# Patient Record
Sex: Male | Born: 1945 | Race: White | Hispanic: No | Marital: Married | Smoking: Former smoker
Health system: Southern US, Community
[De-identification: ages and names within clinical notes are randomized; demographics above are authoritative.]

## PROBLEM LIST (undated history)

## (undated) DIAGNOSIS — K219 Gastro-esophageal reflux disease without esophagitis: Secondary | ICD-10-CM

## (undated) DIAGNOSIS — M1711 Unilateral primary osteoarthritis, right knee: Secondary | ICD-10-CM

## (undated) DIAGNOSIS — T148XXA Other injury of unspecified body region, initial encounter: Secondary | ICD-10-CM

## (undated) DIAGNOSIS — M1712 Unilateral primary osteoarthritis, left knee: Secondary | ICD-10-CM

## (undated) DIAGNOSIS — C801 Malignant (primary) neoplasm, unspecified: Secondary | ICD-10-CM

## (undated) HISTORY — PX: FOOT SURGERY: SHX648

## (undated) HISTORY — PX: BASAL CELL CARCINOMA EXCISION: SHX1214

## (undated) HISTORY — PX: HEMORRHOID BANDING: SHX5850

---

## 1971-08-04 HISTORY — PX: KNEE SURGERY: SHX244

## 2001-08-03 HISTORY — PX: FOOT SURGERY: SHX648

## 2004-06-11 ENCOUNTER — Ambulatory Visit (HOSPITAL_COMMUNITY): Admission: RE | Admit: 2004-06-11 | Discharge: 2004-06-11 | Payer: Self-pay | Admitting: Family Medicine

## 2004-07-04 ENCOUNTER — Ambulatory Visit: Payer: Self-pay | Admitting: Internal Medicine

## 2004-07-04 ENCOUNTER — Ambulatory Visit (HOSPITAL_COMMUNITY): Admission: RE | Admit: 2004-07-04 | Discharge: 2004-07-04 | Payer: Self-pay | Admitting: Internal Medicine

## 2004-07-04 HISTORY — PX: COLONOSCOPY: SHX174

## 2006-02-17 ENCOUNTER — Ambulatory Visit (HOSPITAL_COMMUNITY): Admission: RE | Admit: 2006-02-17 | Discharge: 2006-02-17 | Payer: Self-pay | Admitting: Family Medicine

## 2006-06-11 ENCOUNTER — Ambulatory Visit (HOSPITAL_COMMUNITY): Admission: RE | Admit: 2006-06-11 | Discharge: 2006-06-11 | Payer: Self-pay | Admitting: Family Medicine

## 2008-04-25 ENCOUNTER — Ambulatory Visit (HOSPITAL_COMMUNITY): Admission: RE | Admit: 2008-04-25 | Discharge: 2008-04-25 | Payer: Self-pay | Admitting: Family Medicine

## 2009-06-20 ENCOUNTER — Ambulatory Visit (HOSPITAL_COMMUNITY): Admission: RE | Admit: 2009-06-20 | Discharge: 2009-06-20 | Payer: Self-pay | Admitting: Family Medicine

## 2010-12-19 NOTE — Op Note (Signed)
NAME:  Spencer [name], Spencer [name]              ACCOUNT NO.:  [id number]   MEDICAL RECORD NO.:  [id number]          PATIENT TYPE:  AMB   LOCATION:  DAY                           FACILITY:  [facility]   PHYSICIAN:  R. Roetta Sessions, M.D. DATE OF BIRTH:  04/18/1946   DATE OF PROCEDURE:  07/04/2004  DATE OF DISCHARGE:                                 OPERATIVE REPORT   PROCEDURE:  Colonoscopy with cold biopsy.   INDICATION FOR PROCEDURE:  The patient is a 65 year old gentleman with no  lower GI tract symptoms referred out of the courtesy of Patrica Duel, M.D.,  for colorectal cancer screening.  He has never had a colonoscopy, and there  is no family history of colorectal neoplasia.  Colonoscopy is now being done  as a screening maneuver.  This approach has been discussed with the patient  at length.  The potential risks, benefits, and alternatives have been  reviewed, questions answered, and he is agreeable.  Please see my  handwritten H&P.   PROCEDURE NOTE:  O2 saturation, blood pressure, pulse, and respiration were  monitored throughout the entire procedure.   CONSCIOUS SEDATION:  Versed 3 mg IV, Demerol 75 mg IV in divided doses.   INSTRUMENT USED:  Olympus video chip system.   FINDINGS:  Digital rectal examination revealed no abnormalities.   Endoscopic findings:  The prep was good.   Rectum:  Examination of the rectal mucosa including a retroflexed view of  the anal verge revealed two diminutive polyps, two 2 mm polyps in at 4 cm  from the anal verge.  The remainder of the rectal mucosa appeared normal.   Colon:  The colonic mucosa was surveyed from the rectosigmoid junction  through the left, transverse, and right colon to the area of the appendiceal  orifice, ileocecal valve, and cecum.  These structures were well-seen and  photographed for the record.  From this level the scope was slowly  withdrawn.  All previously-mentioned mucosal surfaces were again seen.  The  patient was noted to  have tiny scattered left-sided diverticula.  The  remainder of the colonic mucosa appeared normal.  The patient tolerated the  procedure well, was reacted in endoscopy.   IMPRESSION:  1.  Diminutive rectal polyps, removed with cold biopsy forceps, otherwise      normal rectum.  2.  Minimal left-sided diverticula, the remainder of the colonic mucosa      appeared normal.   RECOMMENDATIONS:  1.  Diverticulosis literature provided to Mr. Yepes.  2.  Follow up on pathology.  3.  Further recommendations to follow.     Otelia Sergeant   RMR/MEDQ  D:  07/04/2004  T:  07/04/2004  Job:  [id number]   cc:   Patrica Duel, M.D.  [address], [address]  [place]  [place] [address]  Fax: [phone]

## 2013-02-20 ENCOUNTER — Telehealth: Payer: Self-pay

## 2013-02-20 NOTE — Telephone Encounter (Signed)
Pt called and said he received a letter to schedule colonoscopy. He was referred by Dr. Sherwood Gambler for screening and his last was 07/2004 by RMR. He said he has had some blood in stool and they gave him some medicine and is better at this time. OV with Gerrit Halls, NP on 03/09/2013 at 8:00 AM.

## 2013-02-28 ENCOUNTER — Encounter: Payer: Self-pay | Admitting: Internal Medicine

## 2013-03-09 ENCOUNTER — Other Ambulatory Visit: Payer: Self-pay | Admitting: Internal Medicine

## 2013-03-09 ENCOUNTER — Encounter: Payer: Self-pay | Admitting: Gastroenterology

## 2013-03-09 ENCOUNTER — Encounter (HOSPITAL_COMMUNITY): Payer: Self-pay | Admitting: Pharmacy Technician

## 2013-03-09 ENCOUNTER — Ambulatory Visit (INDEPENDENT_AMBULATORY_CARE_PROVIDER_SITE_OTHER): Payer: Medicare Other | Admitting: Gastroenterology

## 2013-03-09 VITALS — BP 140/100 | HR 95 | Temp 97.7°F | Ht 72.0 in | Wt 180.6 lb

## 2013-03-09 DIAGNOSIS — K625 Hemorrhage of anus and rectum: Secondary | ICD-10-CM

## 2013-03-09 MED ORDER — PEG 3350-KCL-NA BICARB-NACL 420 G PO SOLR: 4000.0000 mL | ORAL | Status: DC

## 2013-03-09 NOTE — Progress Notes (Signed)
Primary Care Physician:  FUSCO,LAWRENCE J., MD Primary Gastroenterologist:  Dr. Rourk    Chief Complaint  Patient presents with  . Colonoscopy  . Rectal Bleeding    HPI:   Spencer [name] is a 66-year-old male who presents today secondary to evaluation for updated colonoscopy. Last lower GI evaluation in 2005 by Dr. Rourk with hyperplastic polyp and diverticula.   Noticed rectal bleeding recently. Usually low-volume hematochezia first thing in the morning with first bowel movement. Has had 2 rounds of anusol, with improvement after 2nd round. Now returned. Painless. No itching. No bowel changes. No abdominal pain. No loss of appetite or weight changes. Rare reflux.   Past Medical History  Diagnosis Date  . Medical history non-contributory     Past Surgical History  Procedure Laterality Date  . Colonoscopy  07/04/2004    RMR: Diminutive rectal polyps, removed with cold biopsy forceps, otherwise normal/  Minimal left-sided diverticula, the remainder of the colonic mucosa normal. Hyerplastic  . Foot surgery      X 2  . Knee surgery      Current Outpatient Prescriptions  Medication Sig Dispense Refill  . aspirin 81 MG tablet Take 81 mg by mouth daily.      . Cyanocobalamin (VITAMIN B 12 PO) Take by mouth.      . DHEA 10 MG CAPS Take by mouth.      . fish oil-omega-3 fatty acids 1000 MG capsule Take 1 g by mouth daily.      . Multiple Vitamins-Minerals (MULTIVITAMIN WITH MINERALS) tablet Take 1 tablet by mouth daily.       No current facility-administered medications for this visit.    Allergies as of 03/09/2013  . (No Known Allergies)    Family History  Problem Relation Age of Onset  . Colon cancer Neg Hx     History   Social History  . Marital Status: Married    Spouse Name: N/A    Number of Children: N/A  . Years of Education: N/A   Occupational History  . retired     Dean of Students at [employer]   Social History Main Topics  . Smoking status: Never Smoker   .  Smokeless tobacco: Not on file  . Alcohol Use: Yes     Comment: beer daily   . Drug Use: No  . Sexually Active: Not on file   Other Topics Concern  . Not on file   Social History Narrative  . No narrative on file    Review of Systems: Negative unless mentioned above  Physical Exam: BP 159/100  Pulse 95  Temp(Src) 97.7 F (36.5 C) (Oral)  Ht 6' (1.829 m)  Wt 180 lb 9.6 oz (81.92 kg)  BMI 24.49 kg/m2 General:   Alert and oriented. Pleasant and cooperative. Well-nourished and well-developed.  Head:  Normocephalic and atraumatic. Eyes:  Without icterus, sclera clear and conjunctiva pink.  Ears:  Normal auditory acuity. Nose:  No deformity, discharge,  or lesions. Mouth:  No deformity or lesions, oral mucosa pink.  Neck:  Supple, without mass or thyromegaly. Lungs:  Clear to auscultation bilaterally. No wheezes, rales, or rhonchi. No distress.  Heart:  S1, S2 present without murmurs appreciated.  Abdomen:  +BS, soft, non-tender and non-distended. No HSM noted. No guarding or rebound. No masses appreciated.  Rectal:  Deferred  Msk:  Symmetrical without gross deformities. Normal posture. Extremities:  Without clubbing or edema. Neurologic:  Alert and  oriented x4;  grossly normal neurologically. Skin:    Intact without significant lesions or rashes. Cervical Nodes:  No significant cervical adenopathy. Psych:  Alert and cooperative. Normal mood and affect.    

## 2013-03-09 NOTE — Patient Instructions (Addendum)
We have scheduled you for a colonoscopy with Dr. Rourk in the near future.  Further recommendations to follow shortly!   

## 2013-03-09 NOTE — Assessment & Plan Note (Addendum)
67 year old male with new-onset painless rectal bleeding without changes in bowel habits, abdominal pain, or other concerning symptoms. Last lower GI evaluation in 2005 by Dr. Jena Gauss with hyperplastic polyp and diverticula. Likely dealing with benign anorectal source, but I recommend a repeat lower GI evaluation due to the length of time that has passed since original screening. Spencer [name] agrees with this plan, and we will proceed with a colonoscopy in the near future.  Proceed with TCS with Dr. Jena Gauss in near future: the risks, benefits, and alternatives have been discussed with the patient in detail. The patient states understanding and desires to proceed. Phenergan 12.5 mg IV on call to help augment sedation due to daily low-volume ETOH use.   As of note, hypertensive on exam today but without symptoms. Patient notes he has "white coat syndrome". At home, his BP ranges in the 120-130s/80s.

## 2013-03-13 NOTE — Progress Notes (Signed)
CC'd PCP

## 2013-03-23 ENCOUNTER — Other Ambulatory Visit: Payer: Self-pay | Admitting: Internal Medicine

## 2013-03-23 MED ORDER — PEG 3350-KCL-NA BICARB-NACL 420 G PO SOLR: 4000.0000 mL | ORAL | Status: DC

## 2013-03-27 ENCOUNTER — Encounter (HOSPITAL_COMMUNITY): Payer: Self-pay | Admitting: *Deleted

## 2013-03-27 ENCOUNTER — Encounter (HOSPITAL_COMMUNITY)
Admission: RE | Disposition: A | Discharge status: Home / Self Care | Payer: Self-pay | Source: Ambulatory Visit | Attending: Internal Medicine

## 2013-03-27 ENCOUNTER — Ambulatory Visit (HOSPITAL_COMMUNITY)
Admission: RE | Admit: 2013-03-27 | Discharge: 2013-03-27 | Disposition: A | Discharge status: Home / Self Care | Payer: Medicare PPO | Source: Ambulatory Visit | Attending: Internal Medicine | Admitting: Internal Medicine

## 2013-03-27 DIAGNOSIS — K648 Other hemorrhoids: Secondary | ICD-10-CM

## 2013-03-27 DIAGNOSIS — K921 Melena: Secondary | ICD-10-CM | POA: Insufficient documentation

## 2013-03-27 DIAGNOSIS — Z7982 Long term (current) use of aspirin: Secondary | ICD-10-CM | POA: Insufficient documentation

## 2013-03-27 DIAGNOSIS — K5730 Diverticulosis of large intestine without perforation or abscess without bleeding: Secondary | ICD-10-CM

## 2013-03-27 DIAGNOSIS — D126 Benign neoplasm of colon, unspecified: Secondary | ICD-10-CM

## 2013-03-27 DIAGNOSIS — R233 Spontaneous ecchymoses: Secondary | ICD-10-CM | POA: Insufficient documentation

## 2013-03-27 DIAGNOSIS — K625 Hemorrhage of anus and rectum: Secondary | ICD-10-CM

## 2013-03-27 HISTORY — PX: COLONOSCOPY: SHX5424

## 2013-03-27 SURGERY — COLONOSCOPY
Anesthesia: Moderate Sedation

## 2013-03-27 MED ORDER — PROMETHAZINE HCL 25 MG/ML IJ SOLN: 12.5000 mg | Freq: Once | INTRAMUSCULAR | Status: DC

## 2013-03-27 MED ORDER — MIDAZOLAM HCL 5 MG/5ML IJ SOLN
INTRAMUSCULAR | Status: DC | PRN
  Administered 2013-03-27: 1 mg via INTRAVENOUS
  Administered 2013-03-27 (×2): 2 mg via INTRAVENOUS

## 2013-03-27 MED ORDER — MEPERIDINE HCL 100 MG/ML IJ SOLN
INTRAMUSCULAR | Status: AC
  Filled 2013-03-27: qty 1

## 2013-03-27 MED ORDER — SODIUM CHLORIDE 0.9 % IV SOLN
INTRAVENOUS | Status: DC
  Administered 2013-03-27: 08:00:00 via INTRAVENOUS

## 2013-03-27 MED ORDER — MIDAZOLAM HCL 5 MG/5ML IJ SOLN
INTRAMUSCULAR | Status: AC
  Filled 2013-03-27: qty 10

## 2013-03-27 MED ORDER — PROMETHAZINE HCL 25 MG/ML IJ SOLN
INTRAMUSCULAR | Status: AC
  Filled 2013-03-27: qty 1

## 2013-03-27 MED ORDER — STERILE WATER FOR IRRIGATION IR SOLN
Status: DC | PRN
  Administered 2013-03-27: 09:00:00

## 2013-03-27 MED ORDER — MEPERIDINE HCL 100 MG/ML IJ SOLN
INTRAMUSCULAR | Status: DC | PRN
  Administered 2013-03-27: 25 mg via INTRAVENOUS
  Administered 2013-03-27: 50 mg via INTRAVENOUS

## 2013-03-27 MED ORDER — ONDANSETRON HCL 4 MG/2ML IJ SOLN
INTRAMUSCULAR | Status: DC | PRN
  Administered 2013-03-27: 4 mg via INTRAVENOUS

## 2013-03-27 MED ORDER — ONDANSETRON HCL 4 MG/2ML IJ SOLN
INTRAMUSCULAR | Status: AC
  Filled 2013-03-27: qty 2

## 2013-03-27 MED ORDER — SODIUM CHLORIDE 0.9 % IJ SOLN
INTRAMUSCULAR | Status: AC
  Filled 2013-03-27: qty 10

## 2013-03-27 NOTE — Op Note (Signed)
[facility] [address] [place], [address]   COLONOSCOPY PROCEDURE REPORT  PATIENT: Spencer [name], Spencer [name]  MR#:         [id number] BIRTHDATE: 10-26-1945 , 66  yrs. old GENDER: Male ENDOSCOPIST: R.  Roetta Sessions, MD FACP FACG REFERRED BY:  Artis Delay, M.D. PROCEDURE DATE:  03/27/2013 PROCEDURE:     Colonoscopy with biopsy  INDICATIONS: Hematochezia  INFORMED CONSENT:  The risks, benefits, alternatives and imponderables including but not limited to bleeding, perforation as well as the possibility of a missed lesion have been reviewed.  The potential for biopsy, lesion removal, etc. have also been discussed.  Questions have been answered.  All parties agreeable. Please see the history and physical in the medical record for more information.  MEDICATIONS: Versed 5 mg IV and Demerol 75 mg IV in divided doses. Zofran 4 mg IV.  DESCRIPTION OF PROCEDURE:  After a digital rectal exam was performed, the EC-[id number] ([id number])  colonoscope was advanced from the anus through the rectum and colon to the area of the cecum, ileocecal valve and appendiceal orifice.  The cecum was deeply intubated.  These structures were well-seen and photographed for the record.  From the level of the cecum and ileocecal valve, the scope was slowly and cautiously withdrawn.  The mucosal surfaces were carefully surveyed utilizing scope tip deflection to facilitate fold flattening as needed.  The scope was pulled down into the rectum where a thorough examination including retroflexion was performed.    FINDINGS:  adequate preparation.  Prominent internal hemorrhoids; otherwise normal rectum. Scattered sigmoid diverticula with associated areas of submucosal petechiae (see above photos). The patient also had (2) 2 mm diminutive polypoid lesions opposite the ileocecal valve appeared The remainder of the colonic mucosa appeared normal.  THERAPEUTIC / DIAGNOSTIC MANEUVERS PERFORMED:  The  abnormal sigmoid mucosa was biopsied for histologic study. The diminutive polypoid lesions opposite the ileocecal valve were cold biopsied/removed COMPLICATIONS:  CECAL WITHDRAWAL TIME:  12 minutes  IMPRESSION:  Prominent internal hemorrhoids-likely source of hematochezia. Sigmoid diverticulosis. Submucosal colonic petechiae the sigmoid segment of doubtful clinical significance-status post biopsy. 2 diminutive polypoid lesions opposite the ileocecal valve-cold biopsied/removed  RECOMMENDATIONS: Followup on pathology. Further recommendations to follow ( patient may well benefit from hemorrhoidal banding in the near future)   _______________________________ eSigned:  R. Roetta Sessions, MD FACP [facility] 03/27/2013 9:41 AM   CC:

## 2013-03-27 NOTE — Interval H&P Note (Signed)
History and Physical Interval Note:  03/27/2013 8:56 AM  Spencer [name]  has presented today for surgery, with the diagnosis of RECTAL BLEEDING  The various methods of treatment have been discussed with the patient and family. After consideration of risks, benefits and other options for treatment, the patient has consented to  Procedure(s) with comments: COLONOSCOPY (N/A) - 8:45 as a surgical intervention .  The patient's history has been reviewed, patient examined, no change in status, stable for surgery.  I have reviewed the patient's chart and labs.  Questions were answered to the patient's satisfaction.    No change. Colonoscopy per plan.The risks, benefits, limitations, alternatives and imponderables have been reviewed with the patient. Questions have been answered. All parties are agreeable.    Eula Listen

## 2013-03-27 NOTE — H&P (View-Only) (Signed)
Primary Care Physician:  Cassell Smiles., MD Primary Gastroenterologist:  Dr. Jena Gauss    Chief Complaint  Patient presents with  . Colonoscopy  . Rectal Bleeding    HPI:   Spencer [name] is a 67 year old male who presents today secondary to evaluation for updated colonoscopy. Last lower GI evaluation in 2005 by Dr. Jena Gauss with hyperplastic polyp and diverticula.   Noticed rectal bleeding recently. Usually low-volume hematochezia first thing in the morning with first bowel movement. Has had 2 rounds of anusol, with improvement after 2nd round. Now returned. Painless. No itching. No bowel changes. No abdominal pain. No loss of appetite or weight changes. Rare reflux.   Past Medical History  Diagnosis Date  . Medical history non-contributory     Past Surgical History  Procedure Laterality Date  . Colonoscopy  07/04/2004    RMR: Diminutive rectal polyps, removed with cold biopsy forceps, otherwise normal/  Minimal left-sided diverticula, the remainder of the colonic mucosa normal. Hyerplastic  . Foot surgery      X 2  . Knee surgery      Current Outpatient Prescriptions  Medication Sig Dispense Refill  . aspirin 81 MG tablet Take 81 mg by mouth daily.      . Cyanocobalamin (VITAMIN B 12 PO) Take by mouth.      Marland Kitchen DHEA 10 MG CAPS Take by mouth.      . fish oil-omega-3 fatty acids 1000 MG capsule Take 1 g by mouth daily.      . Multiple Vitamins-Minerals (MULTIVITAMIN WITH MINERALS) tablet Take 1 tablet by mouth daily.       No current facility-administered medications for this visit.    Allergies as of 03/09/2013  . (No Known Allergies)    Family History  Problem Relation Age of Onset  . Colon cancer Neg Hx     History   Social History  . Marital Status: Married    Spouse Name: N/A    Number of Children: N/A  . Years of Education: N/A   Occupational History  . retired     Public house manager of Students at [employer]   Social History Main Topics  . Smoking status: Never Smoker   .  Smokeless tobacco: Not on file  . Alcohol Use: Yes     Comment: beer daily   . Drug Use: No  . Sexually Active: Not on file   Other Topics Concern  . Not on file   Social History Narrative  . No narrative on file    Review of Systems: Negative unless mentioned above  Physical Exam: BP 159/100  Pulse 95  Temp(Src) 97.7 F (36.5 C) (Oral)  Ht 6' (1.829 m)  Wt 180 lb 9.6 oz (81.92 kg)  BMI 24.49 kg/m2 General:   Alert and oriented. Pleasant and cooperative. Well-nourished and well-developed.  Head:  Normocephalic and atraumatic. Eyes:  Without icterus, sclera clear and conjunctiva pink.  Ears:  Normal auditory acuity. Nose:  No deformity, discharge,  or lesions. Mouth:  No deformity or lesions, oral mucosa pink.  Neck:  Supple, without mass or thyromegaly. Lungs:  Clear to auscultation bilaterally. No wheezes, rales, or rhonchi. No distress.  Heart:  S1, S2 present without murmurs appreciated.  Abdomen:  +BS, soft, non-tender and non-distended. No HSM noted. No guarding or rebound. No masses appreciated.  Rectal:  Deferred  Msk:  Symmetrical without gross deformities. Normal posture. Extremities:  Without clubbing or edema. Neurologic:  Alert and  oriented x4;  grossly normal neurologically. Skin:  Intact without significant lesions or rashes. Cervical Nodes:  No significant cervical adenopathy. Psych:  Alert and cooperative. Normal mood and affect.

## 2013-03-29 ENCOUNTER — Encounter (HOSPITAL_COMMUNITY): Payer: Self-pay | Admitting: Internal Medicine

## 2013-03-29 ENCOUNTER — Encounter: Payer: Self-pay | Admitting: Internal Medicine

## 2013-03-29 NOTE — Progress Notes (Signed)
Pt is aware of banding on 9/10 at 245 with RMR and appt card was mailed

## 2013-03-29 NOTE — Progress Notes (Signed)
Cc PCP 

## 2013-03-29 NOTE — Progress Notes (Unsigned)
Letter from: Corbin Ade  Reason for Letter: Results Review   Would offer hemorrhoid banding in the office. Send letter to patient.  Send copy of letter with path to referring provider and PCP.

## 2013-03-29 NOTE — Progress Notes (Unsigned)
Letter mailed to pt. Darl Pikes, please nic and schedule hemorrhoid banding. Benedetto Goad, please cc pcp

## 2013-04-12 ENCOUNTER — Encounter: Payer: Self-pay | Admitting: Internal Medicine

## 2013-04-12 ENCOUNTER — Ambulatory Visit: Payer: Medicare PPO | Admitting: Internal Medicine

## 2013-04-12 VITALS — BP 136/81 | HR 87 | Temp 97.6°F | Ht 72.0 in | Wt 181.8 lb

## 2013-04-12 DIAGNOSIS — K921 Melena: Secondary | ICD-10-CM

## 2013-04-12 DIAGNOSIS — K649 Unspecified hemorrhoids: Secondary | ICD-10-CM

## 2013-04-12 DIAGNOSIS — K648 Other hemorrhoids: Secondary | ICD-10-CM

## 2013-04-12 NOTE — Patient Instructions (Addendum)
Begin Benefiber 2 teaspoons twice daily   Continue to avoid straining when you have a bowel movement  Office visit with [place] in 2-3 weeks to reassess and decide if further banding needed  Call with any interim problems or concerns

## 2013-04-12 NOTE — Progress Notes (Signed)
Hemorrhoid banding note  Patient was found to have friable internal hemorrhoids at colonoscopy recently-felt to be the of  hematochezia. Intermittent and recurrent over the years. Discussed hemorrhoid banding with him at the time of colonoscopy in today. Discussed risks benefits limitations and alternatives. He is agreeable.  Anoscopy performed which revealed a prominent friable right anterior hemorrhoid.  Utilizing the [facility] banding apparatus, a single latex band was applied to the right anterior hemorrhoid. There was no pain associated with this maneuver. A digital rectal exam revealed the band to be appropriately snug. Patient tolerated the procedure very well.  See discharge instructions.

## 2013-05-09 ENCOUNTER — Encounter: Payer: Self-pay | Admitting: Internal Medicine

## 2013-05-09 ENCOUNTER — Ambulatory Visit: Payer: Medicare PPO | Admitting: Internal Medicine

## 2013-05-09 VITALS — BP 153/93 | HR 94 | Temp 97.6°F | Ht 69.0 in | Wt 180.0 lb

## 2013-05-09 DIAGNOSIS — K648 Other hemorrhoids: Secondary | ICD-10-CM

## 2013-05-09 NOTE — Progress Notes (Signed)
Patient is status post hemorrhoid banding of the right anterior pile a couple of weeks ago. He states this has been extremely effective in combating his rectal bleeding. Having no further bleeding has normal bowel function. Does not feel he needs further banding at this time. I agree. Based on anoscopy, the single friable hemorrhoid was banded at his initial session.  I recommended he continue using Benefiber 2 teaspoons twice daily. He will need a surveillance colonoscopy in 5 years given history of adenoma. If he has any interim bleeding he is to let us know .

## 2013-06-15 ENCOUNTER — Other Ambulatory Visit: Payer: Self-pay | Admitting: Dermatology

## 2014-02-22 ENCOUNTER — Other Ambulatory Visit (HOSPITAL_COMMUNITY): Payer: Self-pay | Admitting: Family Medicine

## 2014-02-22 DIAGNOSIS — Z0000 Encounter for general adult medical examination without abnormal findings: Secondary | ICD-10-CM

## 2014-02-22 DIAGNOSIS — Z87891 Personal history of nicotine dependence: Secondary | ICD-10-CM

## 2014-02-23 ENCOUNTER — Other Ambulatory Visit: Payer: Self-pay | Admitting: Radiology

## 2014-02-28 ENCOUNTER — Ambulatory Visit (HOSPITAL_COMMUNITY): Payer: Medicare PPO

## 2014-03-01 ENCOUNTER — Encounter (HOSPITAL_COMMUNITY): Payer: Self-pay | Admitting: Pharmacy Technician

## 2014-03-05 NOTE — Patient Instructions (Signed)
Spencer [name]  03/05/2014   Your procedure is scheduled on:  03/13/14  Report to [facility] at 06:15 AM.  Call this number if you have problems the morning of surgery: [phone]   Remember:   Do not eat food or drink liquids after midnight.   Take these medicines the morning of surgery with A SIP OF WATER: None   Do not wear jewelry, make-up or nail polish.  Do not wear lotions, powders, or perfumes. You may wear deodorant.  Do not shave 48 hours prior to surgery. Men may shave face and neck.  Do not bring valuables to the hospital.  [facility] is not responsible for any belongings or valuables.               Contacts, dentures or bridgework may not be worn into surgery.  Leave suitcase in the car. After surgery it may be brought to your room.  For patients admitted to the hospital, discharge time is determined by your treatment team.               Patients discharged the day of surgery will not be allowed to drive home.   Special Instructions: Shower using Hibiclens the night before surgery and the morning of surgery.   Please read over the following fact sheets that you were given: Anesthesia Post-op Instructions    Olecranon Bursitis Bursitis is swelling and soreness (inflammation) of a fluid-filled sac (bursa) that covers and protects a joint. Olecranon bursitis occurs over the elbow.  CAUSES Bursitis can be caused by injury, overuse of the joint, arthritis, or infection.  SYMPTOMS   Tenderness, swelling, warmth, or redness over the elbow.  Elbow pain with movement. This is greater with bending the elbow.  Squeaking sound when the bursa is rubbed or moved.  Increasing size of the bursa without pain or discomfort.  Fever with increasing pain and swelling if the bursa becomes infected. HOME CARE INSTRUCTIONS   Put ice on the affected area.  Put ice in a plastic bag.  Place a towel between your skin and the bag.  Leave the ice on for 15-20 minutes each hour  while awake. Do this for the first 2 days.  When resting, elevate your elbow above the level of your heart. This helps reduce swelling.  Continue to put the joint through a full range of motion 4 times per day. Rest the injured joint at other times. When the pain lessens, begin normal slow movements and usual activities.  Only take over-the-counter or prescription medicines for pain, discomfort, or fever as directed by your caregiver.  Reduce your intake of milk and related dairy products (cheese, yogurt). They may make your condition worse. SEEK IMMEDIATE MEDICAL CARE IF:   Your pain increases even during treatment.  You have a fever.  You have heat and inflammation over the bursa and elbow.  You have a red line that goes up your arm.  You have pain with movement of your elbow. MAKE SURE YOU:   Understand these instructions.  Will watch your condition.  Will get help right away if you are not doing well or get worse. Document Released: 08/19/2006 Document Revised: 10/12/2011 Document Reviewed: 07/05/2007 [facility] Patient Information 2015 [place], [place]. This information is not intended to replace advice given to you by your health care provider. Make sure you discuss any questions you have with your health care provider.    PATIENT INSTRUCTIONS POST-ANESTHESIA  IMMEDIATELY FOLLOWING SURGERY:  Do not drive  or operate machinery for the first twenty four hours after surgery.  Do not make any important decisions for twenty four hours after surgery or while taking narcotic pain medications or sedatives.  If you develop intractable nausea and vomiting or a severe headache please notify your doctor immediately.  FOLLOW-UP:  Please make an appointment with your surgeon as instructed. You do not need to follow up with anesthesia unless specifically instructed to do so.  WOUND CARE INSTRUCTIONS (if applicable):  Keep a dry clean dressing on the anesthesia/puncture wound site if there  is drainage.  Once the wound has quit draining you may leave it open to air.  Generally you should leave the bandage intact for twenty four hours unless there is drainage.  If the epidural site drains for more than 36-48 hours please call the anesthesia department.  QUESTIONS?:  Please feel free to call your physician or the hospital operator if you have any questions, and they will be happy to assist you.

## 2014-03-06 ENCOUNTER — Encounter (HOSPITAL_COMMUNITY)
Admission: RE | Admit: 2014-03-06 | Discharge: 2014-03-06 | Disposition: A | Discharge status: Home / Self Care | Payer: Medicare PPO | Source: Ambulatory Visit | Attending: Orthopaedic Surgery | Admitting: Orthopaedic Surgery

## 2014-03-12 ENCOUNTER — Ambulatory Visit (HOSPITAL_COMMUNITY)
Admission: RE | Admit: 2014-03-12 | Discharge: 2014-03-12 | Disposition: A | Discharge status: Home / Self Care | Payer: Medicare PPO | Source: Ambulatory Visit | Attending: Family Medicine | Admitting: Family Medicine

## 2014-03-12 DIAGNOSIS — Z87891 Personal history of nicotine dependence: Secondary | ICD-10-CM | POA: Diagnosis not present

## 2014-03-12 DIAGNOSIS — Z0000 Encounter for general adult medical examination without abnormal findings: Secondary | ICD-10-CM | POA: Insufficient documentation

## 2014-03-13 ENCOUNTER — Encounter (HOSPITAL_COMMUNITY): Admission: RE | Payer: Self-pay | Source: Ambulatory Visit

## 2014-03-13 ENCOUNTER — Ambulatory Visit (HOSPITAL_COMMUNITY): Admission: RE | Admit: 2014-03-13 | Payer: Medicare PPO | Source: Ambulatory Visit | Admitting: Orthopaedic Surgery

## 2014-03-13 SURGERY — BURSECTOMY, ELBOW
Anesthesia: Choice | Site: Elbow | Laterality: Right

## 2014-03-22 ENCOUNTER — Other Ambulatory Visit: Payer: Self-pay | Admitting: Radiology

## 2014-03-22 ENCOUNTER — Encounter (HOSPITAL_COMMUNITY): Payer: Self-pay | Admitting: Pharmacy Technician

## 2014-03-23 NOTE — Patient Instructions (Signed)
Spencer [name]  03/23/2014   Your procedure is scheduled on:  03/29/14  Report to [facility] at 6:15 AM.  Call this number if you have problems the morning of surgery: [phone]   Remember:   Do not eat food or drink liquids after midnight.   Take these medicines the morning of surgery with A SIP OF WATER: None   Do not wear jewelry, make-up or nail polish.  Do not wear lotions, powders, or perfumes. You may wear deodorant.  Do not shave 48 hours prior to surgery. Men may shave face and neck.  Do not bring valuables to the hospital.  [facility] is not responsible for any belongings or valuables.               Contacts, dentures or bridgework may not be worn into surgery.  Leave suitcase in the car. After surgery it may be brought to your room.  For patients admitted to the hospital, discharge time is determined by your treatment team.               Patients discharged the day of surgery will not be allowed to drive home.  Name and phone number of your driver:   Special Instructions: Incentive Spirometry - Practice and bring it with you on the day of surgery. Shower using CHG 2 nights before surgery and the night before surgery.  If you shower the day of surgery use CHG.  Use special wash - you have one bottle of CHG for all showers.  You should use approximately 1/3 of the bottle for each shower.   Please read over the following fact sheets that you were given: Surgical Site Infection Prevention and Anesthesia Post-op Instructions  PATIENT INSTRUCTIONS POST-ANESTHESIA  IMMEDIATELY FOLLOWING SURGERY:  Do not drive or operate machinery for the first twenty four hours after surgery.  Do not make any important decisions for twenty four hours after surgery or while taking narcotic pain medications or sedatives.  If you develop intractable nausea and vomiting or a severe headache please notify your doctor immediately.  FOLLOW-UP:  Please make an appointment with your surgeon as  instructed. You do not need to follow up with anesthesia unless specifically instructed to do so.  WOUND CARE INSTRUCTIONS (if applicable):  Keep a dry clean dressing on the anesthesia/puncture wound site if there is drainage.  Once the wound has quit draining you may leave it open to air.  Generally you should leave the bandage intact for twenty four hours unless there is drainage.  If the epidural site drains for more than 36-48 hours please call the anesthesia department.  QUESTIONS?:  Please feel free to call your physician or the hospital operator if you have any questions, and they will be happy to assist you.      Olecranon Bursitis Bursitis is swelling and soreness (inflammation) of a fluid-filled sac (bursa) that covers and protects a joint. Olecranon bursitis occurs over the elbow.  CAUSES Bursitis can be caused by injury, overuse of the joint, arthritis, or infection.  SYMPTOMS   Tenderness, swelling, warmth, or redness over the elbow.  Elbow pain with movement. This is greater with bending the elbow.  Squeaking sound when the bursa is rubbed or moved.  Increasing size of the bursa without pain or discomfort.  Fever with increasing pain and swelling if the bursa becomes infected. HOME CARE INSTRUCTIONS   Put ice on the affected area.  Put ice in a plastic bag.  Place  a towel between your skin and the bag.  Leave the ice on for 15-20 minutes each hour while awake. Do this for the first 2 days.  When resting, elevate your elbow above the level of your heart. This helps reduce swelling.  Continue to put the joint through a full range of motion 4 times per day. Rest the injured joint at other times. When the pain lessens, begin normal slow movements and usual activities.  Only take over-the-counter or prescription medicines for pain, discomfort, or fever as directed by your caregiver.  Reduce your intake of milk and related dairy products (cheese, yogurt). They may make  your condition worse. SEEK IMMEDIATE MEDICAL CARE IF:   Your pain increases even during treatment.  You have a fever.  You have heat and inflammation over the bursa and elbow.  You have a red line that goes up your arm.  You have pain with movement of your elbow. MAKE SURE YOU:   Understand these instructions.  Will watch your condition.  Will get help right away if you are not doing well or get worse. Document Released: 08/19/2006 Document Revised: 10/12/2011 Document Reviewed: 07/05/2007 [facility] Patient Information 2015 [place], [place]. This information is not intended to replace advice given to you by your health care provider. Make sure you discuss any questions you have with your health care provider.

## 2014-03-26 ENCOUNTER — Encounter (HOSPITAL_COMMUNITY): Payer: Self-pay

## 2014-03-26 ENCOUNTER — Encounter (HOSPITAL_COMMUNITY)
Admission: RE | Admit: 2014-03-26 | Discharge: 2014-03-26 | Disposition: A | Discharge status: Home / Self Care | Payer: Medicare PPO | Source: Ambulatory Visit | Attending: Orthopaedic Surgery | Admitting: Orthopaedic Surgery

## 2014-03-26 DIAGNOSIS — Z87891 Personal history of nicotine dependence: Secondary | ICD-10-CM | POA: Diagnosis not present

## 2014-03-26 DIAGNOSIS — M7020 Olecranon bursitis, unspecified elbow: Secondary | ICD-10-CM | POA: Diagnosis not present

## 2014-03-26 DIAGNOSIS — Z85828 Personal history of other malignant neoplasm of skin: Secondary | ICD-10-CM | POA: Diagnosis not present

## 2014-03-26 HISTORY — DX: Malignant (primary) neoplasm, unspecified: C80.1

## 2014-03-26 LAB — CBC WITH DIFFERENTIAL/PLATELET
Basophils Absolute: 0 10*3/uL (ref 0.0–0.1)
Basophils Relative: 0 % (ref 0–1)
Eosinophils Absolute: 0 10*3/uL (ref 0.0–0.7)
Eosinophils Relative: 1 % (ref 0–5)
HCT: 42.6 % (ref 39.0–52.0)
Hemoglobin: 14.8 g/dL (ref 13.0–17.0)
LYMPHS PCT: 27 % (ref 12–46)
Lymphs Abs: 1.9 10*3/uL (ref 0.7–4.0)
MCH: 32.6 pg (ref 26.0–34.0)
MCHC: 34.7 g/dL (ref 30.0–36.0)
MCV: 93.8 fL (ref 78.0–100.0)
Monocytes Absolute: 0.5 10*3/uL (ref 0.1–1.0)
Monocytes Relative: 7 % (ref 3–12)
NEUTROS ABS: 4.6 10*3/uL (ref 1.7–7.7)
Neutrophils Relative %: 65 % (ref 43–77)
PLATELETS: 230 10*3/uL (ref 150–400)
RBC: 4.54 MIL/uL (ref 4.22–5.81)
RDW: 11.9 % (ref 11.5–15.5)
WBC: 7.1 10*3/uL (ref 4.0–10.5)

## 2014-03-26 LAB — COMPREHENSIVE METABOLIC PANEL
ALK PHOS: 53 U/L (ref 39–117)
ALT: 21 U/L (ref 0–53)
AST: 22 U/L (ref 0–37)
Albumin: 3.7 g/dL (ref 3.5–5.2)
Anion gap: 12 (ref 5–15)
BILIRUBIN TOTAL: 0.5 mg/dL (ref 0.3–1.2)
BUN: 13 mg/dL (ref 6–23)
CALCIUM: 9.1 mg/dL (ref 8.4–10.5)
CHLORIDE: 100 meq/L (ref 96–112)
CO2: 28 meq/L (ref 19–32)
Creatinine, Ser: 0.8 mg/dL (ref 0.50–1.35)
GFR calc Af Amer: >90 mL/min (ref >90)
Glucose, Bld: 102 mg/dL — ABNORMAL HIGH (ref 70–99)
Potassium: 4.2 mEq/L (ref 3.7–5.3)
SODIUM: 140 meq/L (ref 137–147)
Total Protein: 6.8 g/dL (ref 6.0–8.3)

## 2014-03-26 LAB — URINALYSIS, ROUTINE W REFLEX MICROSCOPIC
Bilirubin Urine: NEGATIVE
GLUCOSE, UA: NEGATIVE mg/dL
Hgb urine dipstick: NEGATIVE
Ketones, ur: NEGATIVE mg/dL
Leukocytes, UA: NEGATIVE
Nitrite: NEGATIVE
PROTEIN: NEGATIVE mg/dL
Specific Gravity, Urine: <1.005 — ABNORMAL LOW (ref 1.005–1.030)
Urobilinogen, UA: 0.2 mg/dL (ref 0.0–1.0)
pH: 7 (ref 5.0–8.0)

## 2014-03-26 NOTE — Pre-Procedure Instructions (Signed)
Patient given information to sign up for my chart at home. 

## 2014-03-28 NOTE — H&P (Signed)
Spencer [name] is an 68 y.o. male.   Chief Complaint: Swelling of the right elbow HPI: He has had swelling of the right olecranon area for some time.  It has gotten much larger and with significant fluid in the area.  He was scheduled earlier to have the bursa removed but it got less and stayed down after I aspirated it a second time.  He cancelled the surgery but it has gotten much larger and now has dissected medially over to the medial epicondyle area.  It bothers him by the appearance and the area is uncomfortable when he puts his elbow against and object such as a chair arm.  He has had no redness, no numbness.  It is not painful but more uncomfortable.  He would like it excised.  I have gone over the risks and imponderables of the procedure.  He will have a drain the first night after surgery.  He has such a large amount of fluid it may take another procedure later.  He understands this.  He agrees to the outpatient procedure.  Past Medical History  Diagnosis Date  . Medical history non-contributory   . 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 Colonoscopy  07/04/2004    RMR: Diminutive rectal polyps, removed with cold biopsy forceps, otherwise normal/  Minimal left-sided diverticula, the remainder of the colonic mucosa normal. Hyerplastic  . Foot surgery Left     X 2-otif  . Knee surgery Left 1973    open  . Colonoscopy N/A 03/27/2013    Procedure: COLONOSCOPY;  Surgeon: [name], MD;  Location: [facility];  Service: Endoscopy;  Laterality: N/A;  8:45  . Basal cell carcinoma excision      Family History  Problem Relation Age of Onset  . Colon cancer Neg Hx    Social History:  reports that he quit smoking about 39 years ago. His smoking use included Cigarettes. He has a 2.5 pack-year smoking history. He does not have any smokeless tobacco history on file. He reports that he drinks alcohol. He reports that  he does not use illicit drugs.  Allergies: No Known Allergies  No prescriptions prior to admission    No results found for this or any previous visit (from the past 48 hour(s)). No results found.  Review of Systems  Musculoskeletal: Positive for joint pain (He has had swelling of the right olecranon area for some time.).  All other systems reviewed and are negative.   There were no vitals taken for this visit. Physical Exam  Constitutional: He is oriented to person, place, and time. He appears well-developed and well-nourished.  HENT:  Head: Normocephalic and atraumatic.  Eyes: Conjunctivae and EOM are normal. Pupils are equal, round, and reactive to light.  Neck: Normal range of motion.  Cardiovascular: Normal rate, regular rhythm, normal heart sounds and intact distal pulses.   Respiratory: Effort normal and breath sounds normal.  Musculoskeletal: He exhibits tenderness (significant swelling of right elbow around the olecranon bursa with swelling now going medially.  He has no redness.  He has near full flexion and full extension.  He has no numbness. ).       Right elbow: He exhibits swelling, effusion and deformity.       Arms: Neurological: He is alert and oriented to person, place, and time. He has normal reflexes.  Skin: Skin is warm and  dry.  Psychiatric: He has a normal mood and affect. His behavior is normal. Judgment and thought content normal.     Assessment/Plan Large olecranon bursitis with leakage and dissection into the soft tissue around the right elbow.  For excision of bursa and drainage of the fluid as an outpatient.  Patricia Perales 03/28/2014, 3:39 PM

## 2014-03-29 ENCOUNTER — Ambulatory Visit (HOSPITAL_COMMUNITY): Payer: Medicare PPO | Admitting: Anesthesiology

## 2014-03-29 ENCOUNTER — Encounter (HOSPITAL_COMMUNITY)
Admission: RE | Disposition: A | Discharge status: Home / Self Care | Payer: Medicare PPO | Source: Ambulatory Visit | Attending: Orthopaedic Surgery

## 2014-03-29 ENCOUNTER — Encounter (HOSPITAL_COMMUNITY): Payer: Medicare PPO | Admitting: Anesthesiology

## 2014-03-29 ENCOUNTER — Encounter (HOSPITAL_COMMUNITY): Payer: Self-pay | Admitting: *Deleted

## 2014-03-29 ENCOUNTER — Ambulatory Visit (HOSPITAL_COMMUNITY)
Admission: RE | Admit: 2014-03-29 | Discharge: 2014-03-29 | Disposition: A | Discharge status: Home / Self Care | Payer: Medicare PPO | Source: Ambulatory Visit | Attending: Orthopaedic Surgery | Admitting: Orthopaedic Surgery

## 2014-03-29 DIAGNOSIS — M7020 Olecranon bursitis, unspecified elbow: Secondary | ICD-10-CM | POA: Diagnosis not present

## 2014-03-29 DIAGNOSIS — Z87891 Personal history of nicotine dependence: Secondary | ICD-10-CM | POA: Insufficient documentation

## 2014-03-29 DIAGNOSIS — Z85828 Personal history of other malignant neoplasm of skin: Secondary | ICD-10-CM | POA: Insufficient documentation

## 2014-03-29 HISTORY — PX: OLECRANON BURSECTOMY: SHX2097

## 2014-03-29 SURGERY — BURSECTOMY, ELBOW
Anesthesia: Monitor Anesthesia Care | Site: Elbow | Laterality: Right

## 2014-03-29 MED ORDER — MIDAZOLAM HCL 2 MG/2ML IJ SOLN
1.0000 mg | INTRAMUSCULAR | Status: DC | PRN
  Administered 2014-03-29 (×2): 2 mg via INTRAVENOUS
  Filled 2014-03-29: qty 2

## 2014-03-29 MED ORDER — CEFAZOLIN SODIUM-DEXTROSE 2-3 GM-% IV SOLR
2.0000 g | INTRAVENOUS | Status: AC
  Administered 2014-03-29: 2 g via INTRAVENOUS
  Filled 2014-03-29: qty 50

## 2014-03-29 MED ORDER — HYDROCODONE-ACETAMINOPHEN 7.5-325 MG PO TABS: 1.0000 | ORAL_TABLET | ORAL | Status: DC | PRN

## 2014-03-29 MED ORDER — CHLORHEXIDINE GLUCONATE 4 % EX LIQD: 60.0000 mL | Freq: Once | CUTANEOUS | Status: DC

## 2014-03-29 MED ORDER — ONDANSETRON HCL 4 MG/2ML IJ SOLN
INTRAMUSCULAR | Status: AC
  Filled 2014-03-29: qty 2

## 2014-03-29 MED ORDER — FENTANYL CITRATE 0.05 MG/ML IJ SOLN
25.0000 ug | INTRAMUSCULAR | Status: DC | PRN
  Administered 2014-03-29: 50 ug via INTRAVENOUS
  Filled 2014-03-29: qty 2

## 2014-03-29 MED ORDER — FENTANYL CITRATE 0.05 MG/ML IJ SOLN
INTRAMUSCULAR | Status: AC
  Filled 2014-03-29: qty 2

## 2014-03-29 MED ORDER — LACTATED RINGERS IV SOLN
INTRAVENOUS | Status: DC
  Administered 2014-03-29: 1000 mL via INTRAVENOUS

## 2014-03-29 MED ORDER — PROPOFOL 10 MG/ML IV BOLUS
INTRAVENOUS | Status: AC
  Filled 2014-03-29: qty 20

## 2014-03-29 MED ORDER — ONDANSETRON HCL 4 MG/2ML IJ SOLN
4.0000 mg | Freq: Once | INTRAMUSCULAR | Status: AC
  Administered 2014-03-29: 4 mg via INTRAVENOUS

## 2014-03-29 MED ORDER — ONDANSETRON HCL 4 MG/2ML IJ SOLN: 4.0000 mg | Freq: Once | INTRAMUSCULAR | Status: DC | PRN

## 2014-03-29 MED ORDER — LIDOCAINE HCL (PF) 0.5 % IJ SOLN
INTRAMUSCULAR | Status: AC
  Filled 2014-03-29: qty 50

## 2014-03-29 MED ORDER — SODIUM CHLORIDE 0.9 % IJ SOLN
INTRAMUSCULAR | Status: AC
  Filled 2014-03-29: qty 10

## 2014-03-29 MED ORDER — 0.9 % SODIUM CHLORIDE (POUR BTL) OPTIME
TOPICAL | Status: DC | PRN
  Administered 2014-03-29: 1000 mL

## 2014-03-29 MED ORDER — LIDOCAINE HCL (PF) 0.5 % IJ SOLN
INTRAMUSCULAR | Status: DC | PRN
  Administered 2014-03-29: 50 mL via INTRAVENOUS

## 2014-03-29 MED ORDER — LIDOCAINE HCL (CARDIAC) 10 MG/ML IV SOLN
INTRAVENOUS | Status: DC | PRN
  Administered 2014-03-29: 10 mg via INTRAVENOUS

## 2014-03-29 MED ORDER — FENTANYL CITRATE 0.05 MG/ML IJ SOLN
INTRAMUSCULAR | Status: DC | PRN
  Administered 2014-03-29 (×2): 50 ug via INTRAVENOUS

## 2014-03-29 MED ORDER — PROPOFOL INFUSION 10 MG/ML OPTIME
INTRAVENOUS | Status: DC | PRN
  Administered 2014-03-29: 75 ug/kg/min via INTRAVENOUS
  Administered 2014-03-29: 150 ug/kg/min via INTRAVENOUS

## 2014-03-29 MED ORDER — MIDAZOLAM HCL 2 MG/2ML IJ SOLN
INTRAMUSCULAR | Status: AC
  Filled 2014-03-29: qty 2

## 2014-03-29 MED ORDER — FENTANYL CITRATE 0.05 MG/ML IJ SOLN
25.0000 ug | INTRAMUSCULAR | Status: AC
  Administered 2014-03-29 (×2): 25 ug via INTRAVENOUS

## 2014-03-29 SURGICAL SUPPLY — 37 items
BAG HAMPER (MISCELLANEOUS) ×3 IMPLANT
BANDAGE ELASTIC 4 VELCRO NS (GAUZE/BANDAGES/DRESSINGS) ×2 IMPLANT
BANDAGE ELASTIC 4 VELCRO ST LF (GAUZE/BANDAGES/DRESSINGS) ×3 IMPLANT
BANDAGE ESMARK 4X12 BL STRL LF (DISPOSABLE) ×1 IMPLANT
BNDG CMPR 12X4 ELC STRL LF (DISPOSABLE) ×1
BNDG ESMARK 4X12 BLUE STRL LF (DISPOSABLE) ×3
CLOTH BEACON ORANGE TIMEOUT ST (SAFETY) ×3 IMPLANT
COVER LIGHT HANDLE STERIS (MISCELLANEOUS) ×6 IMPLANT
CUFF TOURNIQUET SINGLE 18IN (TOURNIQUET CUFF) ×3 IMPLANT
DRAIN PENROSE 12X.25 LTX STRL (MISCELLANEOUS) ×3 IMPLANT
DURAPREP 26ML APPLICATOR (WOUND CARE) ×2 IMPLANT
ELECT REM PT RETURN 9FT ADLT (ELECTROSURGICAL) ×3
ELECTRODE REM PT RTRN 9FT ADLT (ELECTROSURGICAL) ×1 IMPLANT
GAUZE SPONGE 4X4 12PLY STRL (GAUZE/BANDAGES/DRESSINGS) ×3 IMPLANT
GAUZE XEROFORM 5X9 LF (GAUZE/BANDAGES/DRESSINGS) ×3 IMPLANT
GLOVE BIO SURGEON STRL SZ8 (GLOVE) ×3 IMPLANT
GLOVE BIO SURGEON STRL SZ8.5 (GLOVE) ×3 IMPLANT
GLOVE BIOGEL PI IND STRL 7.0 (GLOVE) IMPLANT
GLOVE BIOGEL PI IND STRL 7.5 (GLOVE) IMPLANT
GLOVE BIOGEL PI INDICATOR 7.0 (GLOVE) ×2
GLOVE BIOGEL PI INDICATOR 7.5 (GLOVE) ×2
GLOVE ECLIPSE 6.5 STRL STRAW (GLOVE) ×2 IMPLANT
GOWN STRL REUS W/TWL LRG LVL3 (GOWN DISPOSABLE) ×4 IMPLANT
GOWN STRL REUS W/TWL XL LVL3 (GOWN DISPOSABLE) ×3 IMPLANT
KIT ROOM TURNOVER APOR (KITS) ×3 IMPLANT
MANIFOLD NEPTUNE II (INSTRUMENTS) ×3 IMPLANT
NS IRRIG 1000ML POUR BTL (IV SOLUTION) ×3 IMPLANT
PACK BASIC LIMB (CUSTOM PROCEDURE TRAY) ×3 IMPLANT
PAD ABD 5X9 TENDERSORB (GAUZE/BANDAGES/DRESSINGS) ×6 IMPLANT
PAD ARMBOARD 7.5X6 YLW CONV (MISCELLANEOUS) ×3 IMPLANT
PAD CAST 4YDX4 CTTN HI CHSV (CAST SUPPLIES) ×2 IMPLANT
PADDING CAST COTTON 4X4 STRL (CAST SUPPLIES) ×6
SET BASIN LINEN APH (SET/KITS/TRAYS/PACK) ×3 IMPLANT
SPONGE LAP 18X18 X RAY DECT (DISPOSABLE) ×3 IMPLANT
STAPLER VISISTAT 35W (STAPLE) ×2 IMPLANT
SUT CHROMIC 2 0 CT 1 (SUTURE) ×3 IMPLANT
SUT PLAIN CT 1/2CIR 2-0 27IN (SUTURE) ×3 IMPLANT

## 2014-03-29 NOTE — Anesthesia Preprocedure Evaluation (Signed)
Anesthesia Evaluation  Patient identified by MRN, date of birth, ID band Patient awake    Reviewed: Allergy & Precautions, H&P , NPO status , Patient's Chart, lab work & pertinent test results  Airway Mallampati: II TM Distance: >3 FB     Dental  (+) Teeth Intact   Pulmonary neg pulmonary ROS, former smoker,  breath sounds clear to auscultation        Cardiovascular negative cardio ROS  Rhythm:Regular Rate:Normal     Neuro/Psych    GI/Hepatic negative GI ROS,   Endo/Other    Renal/GU      Musculoskeletal   Abdominal   Peds  Hematology   Anesthesia Other Findings   Reproductive/Obstetrics                           Anesthesia Physical Anesthesia Plan  ASA: I  Anesthesia Plan: MAC and Bier Block   Post-op Pain Management:    Induction: Intravenous  Airway Management Planned: Nasal Cannula  Additional Equipment:   Intra-op Plan:   Post-operative Plan:   Informed Consent: I have reviewed the patients History and Physical, chart, labs and discussed the procedure including the risks, benefits and alternatives for the proposed anesthesia with the patient or authorized representative who has indicated his/her understanding and acceptance.     Plan Discussed with:   Anesthesia Plan Comments:         Anesthesia Quick Evaluation

## 2014-03-29 NOTE — Discharge Instructions (Signed)
Keep dressing dry.  Use sling.    Take pain medicine as directed.  See Dr. Luna Glasgow tomorrow morning for removal of drain.  Call his office if any problem [phone] or if the office is closed, the hospital at [phone].  Do no remove dressings.  Olecranon Bursitis Bursitis is swelling and soreness (inflammation) of a fluid-filled sac (bursa) that covers and protects a joint. Olecranon bursitis occurs over the elbow.  CAUSES Bursitis can be caused by injury, overuse of the joint, arthritis, or infection.  SYMPTOMS   Tenderness, swelling, warmth, or redness over the elbow.  Elbow pain with movement. This is greater with bending the elbow.  Squeaking sound when the bursa is rubbed or moved.  Increasing size of the bursa without pain or discomfort.  Fever with increasing pain and swelling if the bursa becomes infected. HOME CARE INSTRUCTIONS   Put ice on the affected area.  Put ice in a plastic bag.  Place a towel between your skin and the bag.  Leave the ice on for 15-20 minutes each hour while awake. Do this for the first 2 days.  When resting, elevate your elbow above the level of your heart. This helps reduce swelling.  Continue to put the joint through a full range of motion 4 times per day. Rest the injured joint at other times. When the pain lessens, begin normal slow movements and usual activities.  Only take over-the-counter or prescription medicines for pain, discomfort, or fever as directed by your caregiver.  Reduce your intake of milk and related dairy products (cheese, yogurt). They may make your condition worse. SEEK IMMEDIATE MEDICAL CARE IF:   Your pain increases even during treatment.  You have a fever.  You have heat and inflammation over the bursa and elbow.  You have a red line that goes up your arm.  You have pain with movement of your elbow. MAKE SURE YOU:   Understand these instructions.  Will watch your condition.  Will get help right away if  you are not doing well or get worse. Document Released: 08/19/2006 Document Revised: 10/12/2011 Document Reviewed: 07/05/2007 [facility] Patient Information 2015 [place], [place]. This information is not intended to replace advice given to you by your health care provider. Make sure you discuss any questions you have with your health care provider.  PATIENT INSTRUCTIONS POST-ANESTHESIA  IMMEDIATELY FOLLOWING SURGERY:  Do not drive or operate machinery for the first twenty four hours after surgery.  Do not make any important decisions for twenty four hours after surgery or while taking narcotic pain medications or sedatives.  If you develop intractable nausea and vomiting or a severe headache please notify your doctor immediately.  FOLLOW-UP:  Please make an appointment with your surgeon as instructed. You do not need to follow up with anesthesia unless specifically instructed to do so.  WOUND CARE INSTRUCTIONS (if applicable):  Keep a dry clean dressing on the anesthesia/puncture wound site if there is drainage.  Once the wound has quit draining you may leave it open to air.  Generally you should leave the bandage intact for twenty four hours unless there is drainage.  If the epidural site drains for more than 36-48 hours please call the anesthesia department.  QUESTIONS?:  Please feel free to call your physician or the hospital operator if you have any questions, and they will be happy to assist you.

## 2014-03-29 NOTE — Progress Notes (Signed)
The History and Physical is unchanged. I have examined the patient. The patient is medically able to have surgery on the right elbow . Spencer [name] 

## 2014-03-29 NOTE — Anesthesia Procedure Notes (Signed)
Procedure Name: MAC Date/Time: 03/29/2014 7:26 AM Performed by: Vista Deck Pre-anesthesia Checklist: Patient identified, Emergency Drugs available, Suction available, Timeout performed and Patient being monitored Patient Re-evaluated:Patient Re-evaluated prior to inductionOxygen Delivery Method: Non-rebreather mask    Anesthesia Regional Block:  Bier block (IV Regional)  Pre-Anesthetic Checklist: ,, timeout performed, Correct Patient, Correct Site, Correct Laterality, Correct Procedure, Correct Position, site marked, Risks and benefits discussed, Surgical consent,  Pre-op evaluation,  At surgeon's request  Laterality: Right     Needles:  Injection technique: Single-shot  Needle Type: Other      Needle Gauge: 22 and 22 G    Additional Needles: Bier block (IV Regional)  Nerve Stimulator or Paresthesia:   Additional Responses:  Pulse checked post tourniquet inflation. IV NSL discontinued post injection. Narrative:   Performed by: Personally

## 2014-03-29 NOTE — Transfer of Care (Signed)
Immediate Anesthesia Transfer of Care Note  Patient: Spencer [name]  Procedure(s) Performed: Procedure(s) (LRB): EXCISION OF RIGHT OLECRANON BURSA (Right)  Patient Location: PACU  Anesthesia Type: MAC  Level of Consciousness: awake  Airway & Oxygen Therapy: Patient Spontanous Breathing. Nasal cannula  Post-op Assessment: Report given to PACU RN, Post -op Vital signs reviewed and stable and Patient moving all extremities  Post vital signs: Reviewed and stable  Complications: No apparent anesthesia complications

## 2014-03-29 NOTE — Anesthesia Postprocedure Evaluation (Signed)
  Anesthesia Post-op Note  Patient: Spencer [name]  Procedure(s) Performed: Procedure(s): EXCISION OF RIGHT OLECRANON BURSA (Right)  Patient Location: PACU  Anesthesia Type:MAC and Bier block  Level of Consciousness: awake and patient cooperative  Airway and Oxygen Therapy: Patient Spontanous Breathing and Patient connected to nasal cannula oxygen  Post-op Pain: none  Post-op Assessment: Post-op Vital signs reviewed, Patient's Cardiovascular Status Stable and Respiratory Function Stable  Post-op Vital Signs: Reviewed and stable    Complications: No apparent anesthesia complications

## 2014-03-29 NOTE — Progress Notes (Signed)
Teaching for the incentive spirometry done. Return demonstration done successfully. Device given to wife.

## 2014-03-29 NOTE — Brief Op Note (Signed)
03/29/2014  8:38 AM  PATIENT:  Spencer [name]  68 y.o. male  PRE-OPERATIVE DIAGNOSIS: olecranon bursitis right elbow  POST-OPERATIVE DIAGNOSIS:  bursitis right elbow  PROCEDURE:  Procedure(s): EXCISION OF RIGHT OLECRANON BURSA (Right)  SURGEON:  Surgeon(s) and Role:    * [name], MD - Primary  PHYSICIAN ASSISTANT:   ASSISTANTS: none   ANESTHESIA:   regional  EBL:  Total I/O In: 600 [I.V.:600] Out: 0   BLOOD ADMINISTERED:none  DRAINS: Penrose drain in the right elbow area   LOCAL MEDICATIONS USED:  NONE  SPECIMEN:  Source of Specimen:  Right olecranon bursa  DISPOSITION OF SPECIMEN:  PATHOLOGY  COUNTS:  YES  TOURNIQUET:   Total Tourniquet Time Documented: Upper Arm (Right) - 52 minutes Total: Upper Arm (Right) - 52 minutes   DICTATION: .Other Dictation: Dictation Number [id number]  PLAN OF CARE: Discharge to home after PACU  PATIENT DISPOSITION:  PACU - hemodynamically stable.   Delay start of Pharmacological VTE agent (>24hrs) due to surgical blood loss or risk of bleeding: not applicable

## 2014-03-30 ENCOUNTER — Encounter (HOSPITAL_COMMUNITY): Payer: Self-pay | Admitting: Orthopaedic Surgery

## 2014-03-30 NOTE — Op Note (Signed)
NAME:  Spencer, [name]              ACCOUNT NO.:  [id number]  MEDICAL RECORD NO.:  [id number]  LOCATION:  [facility]                          FACILITY:  [facility]  PHYSICIAN:  J. [name], M.D. DATE OF BIRTH:  1946/01/25  DATE OF PROCEDURE:  03/29/2014 DATE OF DISCHARGE:                              OPERATIVE REPORT   PREOPERATIVE DIAGNOSIS:  Olecranon bursitis, right elbow.  POSTOPERATIVE DIAGNOSIS:  Olecranon bursitis, right elbow.  PROCEDURE:  Excision of right olecranon bursa.  ANESTHESIA:  Regional Bier block.  TOURNIQUET TIME:  52 minutes.  SURGEON:  J. [name], M.D.  DRAINS:  One Penrose drain.  DRESSING:  Bulky dressing applied at the end of the procedure.  INDICATIONS:  The patient has had a very large olecranon bursa for sometime.  It has been much more irritating and swelling and a tremendous amount of fluid around the elbow.  It comes and goes as soon an activity.  He is very active.  He was originally scheduled for surgery earlier this summer, but he postponed because his elbow started getting better, but the swelling has come back.  It is very uncomfortable.  He has had no redness.  I have explained to him about excision of the bursa.  I explained about placing a drain that will be removed tomorrow in the office.  I explained that sometimes some fluid can reaccumulate even after the bursa has been removed, although procedures may need to be done later.  He appears to understand and agreed to the procedure as outlined.  DESCRIPTION OF PROCEDURE:  The patient was seen in the holding area and the right elbow was identified as correct surgical site.  I placed a mark directly over the olecranon bursa on the right elbow.  He was brought to the operating room and given Bier block anesthesia while supine on the operating room table.  He was prepped and draped in usual manner.  We had a generalized time-out identifying the patient as [name] and doing the  procedure of right olecranon bursa excision.  Everyone in the room knew each other.  All instrumentation was properly positioned and working.  Bier block anesthesia had already been given and incision was made directly over the bursa.  An incision was made directly over the midline and a large amount of fluid was removed.  With careful dissection, the bursa was removed with a very large olecranon bursa.  Slowly dissected and removed the bursa.  Once the bursa was completely removed and we double checked for hemostasis, I applied some 2-0 chromic sutures to the subcutaneous tissue and attached to the fascial layer below to prevent gaps and hopefully that in order to adhere to this later,to prevent a seroma or hematoma pocket from developing. Penrose drain was placed.  A 2-0 plain was used for subcutaneous tissue and then skin staples were used.  Good smooth, good contact, good contour.  Bulky dressing applied.  ABD was applied.  Webril applied with an Ace bandage.  Tourniquet time was 52 minutes.  Tolerated well.  Will go to recovery room in good condition.  Appropriate analgesia given for pain.  If any difficulties, contact my through the office  hospital beeper system.  Will be seen in my office tomorrow.          ______________________________ Lenna Sciara. [name], M.D.     JWK/MEDQ  D:  03/29/2014  T:  03/29/2014  Job:  [id number]

## 2014-06-05 ENCOUNTER — Ambulatory Visit (INDEPENDENT_AMBULATORY_CARE_PROVIDER_SITE_OTHER): Payer: Medicare PPO | Admitting: Internal Medicine

## 2014-06-05 VITALS — BP 134/87 | HR 88 | Temp 97.3°F | Ht 72.0 in | Wt 175.0 lb

## 2014-06-05 DIAGNOSIS — K921 Melena: Secondary | ICD-10-CM

## 2014-06-05 DIAGNOSIS — K640 First degree hemorrhoids: Secondary | ICD-10-CM

## 2014-06-05 NOTE — Progress Notes (Signed)
,    Primary Care Physician:  Pcp Not In System Primary Gastroenterologist:  Dr. Gala Romney  Pre-Procedure History & Physical: HPI:  Spencer [name] is a 68 y.o. male here for of self-limiting hematochezia. Had hemorrhoidal bleeding last year had a right anterior hemorrhoid banded here. This was associated or resolution of his symptoms. Takes Benefiber daily. Has 2 bowel movements daily.patient reports recently getting on an antique tractor and bush-hogged a field;  Had a little painless fresh blood per rectum;  took preparation H. and bleeding has resolved. He made the appointment to check in. Colonoscopy just last year demonstrated a couple small colonic adenomas and internal hemorrhoids and diverticulosis. He will be due for surveillance colonoscopy 4 years from now.  At this time, he is not having any lower GI tract symptoms.  Past Medical History  Diagnosis Date  . Medical history non-contributory   . 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 Colonoscopy  07/04/2004    RMR: Diminutive rectal polyps, removed with cold biopsy forceps, otherwise normal/  Minimal left-sided diverticula, the remainder of the colonic mucosa normal. Hyerplastic  . Foot surgery Left     X 2-otif  . Knee surgery Left 1973    open  . Colonoscopy N/A 03/27/2013    Dr.Rourk-prominent internal hemorrhoids o/w normal rectum. scattered sigmoid diverticula with associated areas of submucosal petechiae. two 18mm diminutive polypoid lesions opposite the ileocecal valve the remainder of the colonic mucosa appeared normal. bx= tubular adenoma, benign hyperemic colorectal mucosa with focal active colitis  . Basal cell carcinoma excision    . Olecranon bursectomy Right 03/29/2014    Procedure: EXCISION OF RIGHT OLECRANON BURSA;  Surgeon: [name], MD;  Location: [facility];  Service: Orthopedics;  Laterality: Right;  . Hemorrhoid banding      Prior to  Admission medications   Medication Sig Start Date End Date Taking? Authorizing Provider  aspirin 81 MG tablet Take 162 mg by mouth daily.    Yes Historical Provider, MD  Cholecalciferol (VITAMIN D-3) 1000 UNITS CAPS Take 1 capsule by mouth daily.   Yes Historical Provider, MD  Cyanocobalamin (VITAMIN B 12 PO) Take 1 tablet by mouth daily.    Yes Historical Provider, MD  fish oil-omega-3 fatty acids 1000 MG capsule Take 1 g by mouth daily.   Yes Historical Provider, MD  MELATONIN PO Take 1 tablet by mouth daily.   Yes Historical Provider, MD  Nutritional Supplements (DHEA PO) Take 1 tablet by mouth daily.   Yes Historical Provider, MD  HYDROcodone-acetaminophen (NORCO) 7.5-325 MG per tablet Take 1 tablet by mouth every 4 (four) hours as needed for moderate pain. 03/29/14   [name], MD    Allergies as of 06/05/2014  . (No Known Allergies)    Family History  Problem Relation Age of Onset  . Colon cancer Neg Hx     History   Social History  . Marital Status: Married    Spouse Name: N/A    Number of Children: N/A  . Years of Education: N/A   Occupational History  . retired     Scientist, physiological of Students at [employer]  . Smoking status: Former Smoker -- 0.50 packs/day for 5 years    Types: Cigarettes    Quit date: 03/27/1975  . Smokeless tobacco: Not on file  . Alcohol Use: Yes  Comment: beer daily   . Drug Use: No  . Sexual Activity: Yes    Birth Control/ Protection: None   Other Topics Concern  . Not on file   Social History Narrative    Review of Systems: See HPI, otherwise negative ROS  Physical Exam: BP 134/87 mmHg  Pulse 88  Temp(Src) 97.3 F (36.3 C)  Ht 6' (1.829 m)  Wt 175 lb (79.379 kg)  BMI 23.73 kg/m2 General:   Alert,  Well-developed, well-nourished, pleasant and cooperative in NAD Detailed exam deferred   Impression:   68 year old gentleman with a self-limiting hematochezia likely hemorrhoidal in origin. He is now  asymptomatic. He has known hemorrhoids. History of colonic adenoma - colonoscopy last year revealed nothing else significant.  We talked about the possibility another band today day versus expectant management. He prefers to just see how it goes, continuing to take fiber daily. This is certainly not unreasonable as long as bleeding does not recur appear   Recommendations:  Expected management.  Colonoscopy in 4 years. Patient is to let me know if rectal bleeding recurs.    Notice: This dictation was prepared with Dragon dictation along with smaller phrase technology. Any transcriptional errors that result from this process are unintentional and may not be corrected upon review.

## 2014-06-05 NOTE — Patient Instructions (Addendum)
Avoid straining.  Benefiber 2 teaspoons twice daily  Limit toilet time to 2-3 minutes  Call with any interim problems  Schedule followup as needed

## 2014-07-12 ENCOUNTER — Other Ambulatory Visit: Payer: Self-pay | Admitting: Dermatology

## 2016-06-10 ENCOUNTER — Other Ambulatory Visit: Payer: Self-pay | Admitting: Orthopedic Surgery

## 2016-06-10 ENCOUNTER — Encounter (HOSPITAL_COMMUNITY): Payer: Self-pay | Admitting: Physician Assistant

## 2016-06-10 DIAGNOSIS — M7121 Synovial cyst of popliteal space [Baker], right knee: Secondary | ICD-10-CM

## 2016-06-10 DIAGNOSIS — M1712 Unilateral primary osteoarthritis, left knee: Secondary | ICD-10-CM | POA: Diagnosis present

## 2016-06-10 NOTE — H&P (Signed)
TOTAL KNEE ADMISSION H&P  Patient is being admitted for left total knee arthroplasty.  Subjective:  Chief Complaint:left knee pain.  HPI: Spencer [name], 70 y.o. male, has a history of pain and functional disability in the left knee due to arthritis and has failed non-surgical conservative treatments for greater than 12 weeks to includeNSAID's and/or analgesics, corticosteriod injections, viscosupplementation injections, flexibility and strengthening excercises, supervised PT with diminished ADL's post treatment, weight reduction as appropriate and activity modification.  Onset of symptoms was gradual, starting 10 years ago with gradually worsening course since that time. The patient noted prior procedures on the knee to include  arthroscopy and menisectomy on the left knee(s).  Patient currently rates pain in the left knee(s) at 10 out of 10 with activity. Patient has night pain, worsening of pain with activity and weight bearing, pain that interferes with activities of daily living, crepitus and joint swelling.  Patient has evidence of subchondral sclerosis, periarticular osteophytes and joint space narrowing by imaging studies. There is no active infection.  Patient Active Problem List   Diagnosis Date Noted  . Rectal bleeding 03/09/2013   Past Medical History:  Diagnosis Date  . 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 Medical history non-contributory     Past Surgical History:  Procedure Laterality Date  . BASAL CELL CARCINOMA EXCISION    . COLONOSCOPY  07/04/2004   RMR: Diminutive rectal polyps, removed with cold biopsy forceps, otherwise normal/  Minimal left-sided diverticula, the remainder of the colonic mucosa normal. Hyerplastic  . COLONOSCOPY N/A 03/27/2013   Dr.Rourk-prominent internal hemorrhoids o/w normal rectum. scattered sigmoid diverticula with associated areas of submucosal petechiae. two 34mm diminutive polypoid lesions opposite the ileocecal valve  the remainder of the colonic mucosa appeared normal. bx= tubular adenoma, benign hyperemic colorectal mucosa with focal active colitis  . FOOT SURGERY Left    X 2-otif  . HEMORRHOID BANDING    . KNEE SURGERY Left 1973   open  . OLECRANON BURSECTOMY Right 03/29/2014   Procedure: EXCISION OF RIGHT OLECRANON BURSA;  Surgeon: [name], MD;  Location: [facility];  Service: Orthopedics;  Laterality: Right;    No prescriptions prior to admission.   No Known Allergies  Social History  Substance Use Topics  . Smoking status: Former Smoker    Packs/day: 0.50    Years: 5.00    Types: Cigarettes    Quit date: 03/27/1975  . Smokeless tobacco: Former Systems developer    Quit date: 08/03/1978  . Alcohol use Yes     Comment: beer daily     Family History  Problem Relation Age of Onset  . Heart failure Mother   . COPD Sister   . Colon cancer Neg Hx      Review of Systems  Constitutional: Negative.   HENT: Negative.   Eyes: Negative.   Respiratory: Negative.   Cardiovascular: Negative.   Gastrointestinal: Negative.   Genitourinary: Negative.   Musculoskeletal: Positive for back pain and joint pain.  Skin: Negative.   Neurological: Negative.   Endo/Heme/Allergies: Negative.   Psychiatric/Behavioral: Negative.     Objective:  Physical Exam  Constitutional: He is oriented to person, place, and time. He appears well-developed and well-nourished.  HENT:  Head: Normocephalic and atraumatic.  Mouth/Throat: Oropharynx is clear and moist.  Eyes: Conjunctivae are normal. Pupils are equal, round, and reactive to light.  Neck: Neck supple.  Cardiovascular: Normal rate and regular rhythm.   Respiratory: Breath sounds normal.  GI: Bowel sounds are normal.  Genitourinary:  Genitourinary Comments: Not pertinent to current symptomatology therefore not examined.  Musculoskeletal:  Examination of both knees reveal pain bilaterally.  Moderate varus deformity.  1+ synovitis.  Range of motion -5 to 110  degrees.  Knees are stable with normal patella tracking.  Vascular exam: Pulses are 2+ and symmetric.    Neurological: He is alert and oriented to person, place, and time.  Skin: Skin is warm and dry.  Psychiatric: He has a normal mood and affect. His behavior is normal.    Vital signs in last 24 hours: Temp:  [97.9 F (36.6 C)] 97.9 F (36.6 C) (11/08 1400) Pulse Rate:  [86] 86 (11/08 1400) BP: (159)/(90) 159/90 (11/08 1400) SpO2:  [98 %] 98 % (11/08 1400) Weight:  [83 kg (183 lb)] 83 kg (183 lb) (11/08 1400)  Labs:   Estimated body mass index is 24.82 kg/m as calculated from the following:   Height as of this encounter: 6' (1.829 m).   Weight as of this encounter: 83 kg (183 lb).   Imaging Review Plain radiographs demonstrate severe degenerative joint disease of the left knee(s). The overall alignment ismild varus. The bone quality appears to be good for age and reported activity level.  Assessment/Plan:  End stage arthritis, left knee Principal Problem:   Primary localized osteoarthritis of left knee    The patient history, physical examination, clinical judgment of the provider and imaging studies are consistent with end stage degenerative joint disease of the left knee(s) and total knee arthroplasty is deemed medically necessary. The treatment options including medical management, injection therapy arthroscopy and arthroplasty were discussed at length. The risks and benefits of total knee arthroplasty were presented and reviewed. The risks due to aseptic loosening, infection, stiffness, patella tracking problems, thromboembolic complications and other imponderables were discussed. The patient acknowledged the explanation, agreed to proceed with the plan and consent was signed. Patient is being admitted for inpatient treatment for surgery, pain control, PT, OT, prophylactic antibiotics, VTE prophylaxis, progressive ambulation and ADL's and discharge planning. The patient is  planning to be discharged home with home health services

## 2016-06-18 ENCOUNTER — Ambulatory Visit
Admission: RE | Admit: 2016-06-18 | Discharge: 2016-06-18 | Disposition: A | Discharge status: Home / Self Care | Payer: Medicare Other | Source: Ambulatory Visit | Attending: Orthopedic Surgery | Admitting: Orthopedic Surgery

## 2016-06-18 DIAGNOSIS — M7121 Synovial cyst of popliteal space [Baker], right knee: Secondary | ICD-10-CM

## 2016-06-18 NOTE — Pre-Procedure Instructions (Addendum)
    Spencer [name]  06/18/2016      [place], [place] [id number] [name] [id number] Phone: [phone] Fax: [phone]  [place], [name] [address] Phone: [phone] Fax: [phone]    Your procedure is scheduled on  Mon. Nov. 27  Report to [facility] Admitting at 5:30 A.M.  Call this number if you have problems the morning of surgery:  [phone]   Remember:  Do not eat food or drink liquids after midnight.  Take these medicines the morning of surgery with A SIP OF WATER: none             1 week prior to surgery stop: aspirin, advil, motrin, ibuprofen, aleve,BC Powders, Goody's, fish oil, vitamins/herbal medicines.   Do not wear jewelry  Do not wear lotions, powders, or cologne, or deoderant.  Do not shave 48 hours prior to surgery.  Men may shave face and neck.  Do not bring valuables to the hospital.  [facility] is not responsible for any belongings or valuables.  Contacts, dentures or bridgework may not be worn into surgery.  Leave your suitcase in the car.  After surgery it may be brought to your room.  For patients admitted to the hospital, discharge time will be determined by your treatment team.  Patients discharged the day of surgery will not be allowed to drive home.    Special instructions:  Review preparing for surgery  Please read over the following fact sheets that you were given. Coughing and Deep Breathing, Total Joint Packet and MRSA Information

## 2016-06-19 ENCOUNTER — Encounter (HOSPITAL_COMMUNITY): Payer: Self-pay

## 2016-06-19 ENCOUNTER — Encounter (HOSPITAL_COMMUNITY)
Admission: RE | Admit: 2016-06-19 | Discharge: 2016-06-19 | Disposition: A | Discharge status: Home / Self Care | Payer: Medicare Other | Source: Ambulatory Visit | Attending: Orthopedic Surgery | Admitting: Orthopedic Surgery

## 2016-06-19 DIAGNOSIS — M1712 Unilateral primary osteoarthritis, left knee: Secondary | ICD-10-CM | POA: Diagnosis not present

## 2016-06-19 DIAGNOSIS — Z01810 Encounter for preprocedural cardiovascular examination: Secondary | ICD-10-CM | POA: Insufficient documentation

## 2016-06-19 DIAGNOSIS — Z01812 Encounter for preprocedural laboratory examination: Secondary | ICD-10-CM | POA: Diagnosis not present

## 2016-06-19 HISTORY — DX: Gastro-esophageal reflux disease without esophagitis: K21.9

## 2016-06-19 HISTORY — DX: Other injury of unspecified body region, initial encounter: T14.8XXA

## 2016-06-19 LAB — SURGICAL PCR SCREEN
MRSA, PCR: NEGATIVE
Staphylococcus aureus: NEGATIVE

## 2016-06-19 LAB — COMPREHENSIVE METABOLIC PANEL
ALBUMIN: 4.2 g/dL (ref 3.5–5.0)
ALK PHOS: 45 U/L (ref 38–126)
ALT: 28 U/L (ref 17–63)
AST: 28 U/L (ref 15–41)
Anion gap: 8 (ref 5–15)
BUN: 12 mg/dL (ref 6–20)
CALCIUM: 10.1 mg/dL (ref 8.9–10.3)
CO2: 26 mmol/L (ref 22–32)
CREATININE: 0.76 mg/dL (ref 0.61–1.24)
Chloride: 104 mmol/L (ref 101–111)
GFR calc Af Amer: >60 mL/min (ref >60)
GFR calc non Af Amer: >60 mL/min (ref >60)
GLUCOSE: 121 mg/dL — AB (ref 65–99)
Potassium: 3.7 mmol/L (ref 3.5–5.1)
SODIUM: 138 mmol/L (ref 135–145)
Total Bilirubin: 0.9 mg/dL (ref 0.3–1.2)
Total Protein: 7.9 g/dL (ref 6.5–8.1)

## 2016-06-19 LAB — PROTIME-INR
INR: 1.01
Prothrombin Time: 13.3 seconds (ref 11.4–15.2)

## 2016-06-19 LAB — ABO/RH: ABO/RH(D): O POS

## 2016-06-19 LAB — CBC WITH DIFFERENTIAL/PLATELET
BASOS PCT: 0 %
Basophils Absolute: 0 10*3/uL (ref 0.0–0.1)
EOS ABS: 0 10*3/uL (ref 0.0–0.7)
Eosinophils Relative: 0 %
HCT: 45.5 % (ref 39.0–52.0)
HEMOGLOBIN: 16.2 g/dL (ref 13.0–17.0)
Lymphocytes Relative: 7 %
Lymphs Abs: 0.9 10*3/uL (ref 0.7–4.0)
MCH: 33.6 pg (ref 26.0–34.0)
MCHC: 35.6 g/dL (ref 30.0–36.0)
MCV: 94.4 fL (ref 78.0–100.0)
Monocytes Absolute: 0.4 10*3/uL (ref 0.1–1.0)
Monocytes Relative: 4 %
NEUTROS ABS: 10.8 10*3/uL — AB (ref 1.7–7.7)
NEUTROS PCT: 89 %
Platelets: 236 10*3/uL (ref 150–400)
RBC: 4.82 MIL/uL (ref 4.22–5.81)
RDW: 11.6 % (ref 11.5–15.5)
WBC: 12.1 10*3/uL — AB (ref 4.0–10.5)

## 2016-06-19 LAB — TYPE AND SCREEN
ABO/RH(D): O POS
Antibody Screen: NEGATIVE

## 2016-06-19 LAB — APTT: aPTT: 28 seconds (ref 24–36)

## 2016-06-19 NOTE — Progress Notes (Signed)
PCP: [facility] in [place]

## 2016-06-20 LAB — URINE CULTURE: Culture: NO GROWTH

## 2016-06-29 ENCOUNTER — Encounter (HOSPITAL_COMMUNITY)
Admission: RE | Disposition: A | Discharge status: Home / Self Care | Payer: Self-pay | Source: Ambulatory Visit | Attending: Orthopedic Surgery

## 2016-06-29 ENCOUNTER — Inpatient Hospital Stay (HOSPITAL_COMMUNITY): Payer: Medicare Other | Admitting: Certified Registered Nurse Anesthetist

## 2016-06-29 ENCOUNTER — Inpatient Hospital Stay (HOSPITAL_COMMUNITY)
Admission: RE | Admit: 2016-06-29 | Discharge: 2016-06-30 | DRG: 470 | Disposition: A | Discharge status: Home / Self Care | Payer: Medicare Other | Source: Ambulatory Visit | Attending: Orthopedic Surgery | Admitting: Orthopedic Surgery

## 2016-06-29 ENCOUNTER — Encounter (HOSPITAL_COMMUNITY): Payer: Self-pay | Admitting: *Deleted

## 2016-06-29 DIAGNOSIS — K219 Gastro-esophageal reflux disease without esophagitis: Secondary | ICD-10-CM | POA: Diagnosis present

## 2016-06-29 DIAGNOSIS — Z79899 Other long term (current) drug therapy: Secondary | ICD-10-CM

## 2016-06-29 DIAGNOSIS — Z85828 Personal history of other malignant neoplasm of skin: Secondary | ICD-10-CM | POA: Diagnosis not present

## 2016-06-29 DIAGNOSIS — Z87891 Personal history of nicotine dependence: Secondary | ICD-10-CM | POA: Diagnosis not present

## 2016-06-29 DIAGNOSIS — M1712 Unilateral primary osteoarthritis, left knee: Secondary | ICD-10-CM | POA: Diagnosis present

## 2016-06-29 HISTORY — PX: TOTAL KNEE ARTHROPLASTY: SHX125

## 2016-06-29 HISTORY — DX: Unilateral primary osteoarthritis, left knee: M17.12

## 2016-06-29 SURGERY — ARTHROPLASTY, KNEE, TOTAL
Anesthesia: Monitor Anesthesia Care | Site: Knee | Laterality: Left

## 2016-06-29 MED ORDER — CELECOXIB 200 MG PO CAPS
200.0000 mg | ORAL_CAPSULE | Freq: Two times a day (BID) | ORAL | Status: DC
  Administered 2016-06-29 – 2016-06-30 (×3): 200 mg via ORAL
  Filled 2016-06-29 (×3): qty 1

## 2016-06-29 MED ORDER — HYDROMORPHONE HCL 2 MG/ML IJ SOLN: 1.0000 mg | INTRAMUSCULAR | Status: DC | PRN

## 2016-06-29 MED ORDER — ADULT MULTIVITAMIN W/MINERALS CH
1.0000 | ORAL_TABLET | Freq: Every day | ORAL | Status: DC
  Administered 2016-06-29 – 2016-06-30 (×2): 1 via ORAL
  Filled 2016-06-29 (×2): qty 1

## 2016-06-29 MED ORDER — CEFAZOLIN SODIUM-DEXTROSE 2-4 GM/100ML-% IV SOLN
2.0000 g | INTRAVENOUS | Status: AC
  Administered 2016-06-29: 2 g via INTRAVENOUS
  Filled 2016-06-29: qty 100

## 2016-06-29 MED ORDER — EPINEPHRINE PF 1 MG/ML IJ SOLN
INTRAMUSCULAR | Status: AC
  Filled 2016-06-29: qty 1

## 2016-06-29 MED ORDER — OXYCODONE HCL 5 MG PO TABS
5.0000 mg | ORAL_TABLET | ORAL | Status: DC | PRN
  Administered 2016-06-29 – 2016-06-30 (×5): 10 mg via ORAL
  Administered 2016-06-30: 5 mg via ORAL
  Filled 2016-06-29 (×6): qty 2

## 2016-06-29 MED ORDER — PROPOFOL 500 MG/50ML IV EMUL
INTRAVENOUS | Status: DC | PRN
  Administered 2016-06-29: 75 ug/kg/min via INTRAVENOUS

## 2016-06-29 MED ORDER — SODIUM CHLORIDE 0.9 % IR SOLN
Status: DC | PRN
  Administered 2016-06-29: 3000 mL

## 2016-06-29 MED ORDER — PHENOL 1.4 % MT LIQD: 1.0000 | OROMUCOSAL | Status: DC | PRN

## 2016-06-29 MED ORDER — POLYETHYLENE GLYCOL 3350 17 G PO PACK
17.0000 g | PACK | Freq: Two times a day (BID) | ORAL | Status: DC
  Administered 2016-06-29 – 2016-06-30 (×2): 17 g via ORAL
  Filled 2016-06-29 (×3): qty 1

## 2016-06-29 MED ORDER — ONDANSETRON HCL 4 MG/2ML IJ SOLN: 4.0000 mg | Freq: Four times a day (QID) | INTRAMUSCULAR | Status: DC | PRN

## 2016-06-29 MED ORDER — OXYCODONE HCL 5 MG/5ML PO SOLN: 5.0000 mg | Freq: Once | ORAL | Status: DC | PRN

## 2016-06-29 MED ORDER — SUCCINYLCHOLINE CHLORIDE 200 MG/10ML IV SOSY
PREFILLED_SYRINGE | INTRAVENOUS | Status: AC
  Filled 2016-06-29: qty 10

## 2016-06-29 MED ORDER — EPHEDRINE 5 MG/ML INJ
INTRAVENOUS | Status: AC
  Filled 2016-06-29: qty 10

## 2016-06-29 MED ORDER — DIPHENHYDRAMINE HCL 12.5 MG/5ML PO ELIX
12.5000 mg | ORAL_SOLUTION | ORAL | Status: DC | PRN
Start: 2016-06-29 — End: 2016-06-30

## 2016-06-29 MED ORDER — POTASSIUM CHLORIDE IN NACL 20-0.9 MEQ/L-% IV SOLN
INTRAVENOUS | Status: DC
  Administered 2016-06-29: 13:00:00 via INTRAVENOUS
  Filled 2016-06-29: qty 1000

## 2016-06-29 MED ORDER — HYDROMORPHONE HCL 1 MG/ML IJ SOLN: 0.2500 mg | INTRAMUSCULAR | Status: DC | PRN

## 2016-06-29 MED ORDER — LACTATED RINGERS IV SOLN
INTRAVENOUS | Status: DC
  Administered 2016-06-29 (×2): via INTRAVENOUS

## 2016-06-29 MED ORDER — MIDAZOLAM HCL 2 MG/2ML IJ SOLN
INTRAMUSCULAR | Status: AC
  Filled 2016-06-29: qty 2

## 2016-06-29 MED ORDER — METOCLOPRAMIDE HCL 5 MG/ML IJ SOLN
5.0000 mg | Freq: Three times a day (TID) | INTRAMUSCULAR | Status: DC | PRN
Start: 2016-06-29 — End: 2016-06-30

## 2016-06-29 MED ORDER — DEXAMETHASONE SODIUM PHOSPHATE 10 MG/ML IJ SOLN
10.0000 mg | Freq: Three times a day (TID) | INTRAMUSCULAR | Status: DC
  Administered 2016-06-29 – 2016-06-30 (×2): 10 mg via INTRAVENOUS
  Filled 2016-06-29 (×3): qty 1

## 2016-06-29 MED ORDER — 0.9 % SODIUM CHLORIDE (POUR BTL) OPTIME
TOPICAL | Status: DC | PRN
  Administered 2016-06-29: 1000 mL

## 2016-06-29 MED ORDER — FAMOTIDINE 10 MG PO TABS
10.0000 mg | ORAL_TABLET | Freq: Every day | ORAL | Status: DC
  Administered 2016-06-29: 10 mg via ORAL
  Filled 2016-06-29: qty 1

## 2016-06-29 MED ORDER — MENTHOL 3 MG MT LOZG: 1.0000 | LOZENGE | OROMUCOSAL | Status: DC | PRN

## 2016-06-29 MED ORDER — ACETAMINOPHEN 325 MG PO TABS
650.0000 mg | ORAL_TABLET | Freq: Four times a day (QID) | ORAL | Status: DC | PRN
  Administered 2016-06-29 – 2016-06-30 (×4): 650 mg via ORAL
  Filled 2016-06-29 (×4): qty 2

## 2016-06-29 MED ORDER — POVIDONE-IODINE 7.5 % EX SOLN: Freq: Once | CUTANEOUS | Status: DC

## 2016-06-29 MED ORDER — DEXAMETHASONE SODIUM PHOSPHATE 10 MG/ML IJ SOLN
INTRAMUSCULAR | Status: DC | PRN
  Administered 2016-06-29: 10 mg via INTRAVENOUS

## 2016-06-29 MED ORDER — FENTANYL CITRATE (PF) 100 MCG/2ML IJ SOLN
INTRAMUSCULAR | Status: DC | PRN
  Administered 2016-06-29 (×3): 50 ug via INTRAVENOUS

## 2016-06-29 MED ORDER — CEFAZOLIN SODIUM-DEXTROSE 2-4 GM/100ML-% IV SOLN
2.0000 g | Freq: Four times a day (QID) | INTRAVENOUS | Status: AC
  Administered 2016-06-29 (×2): 2 g via INTRAVENOUS
  Filled 2016-06-29 (×2): qty 100

## 2016-06-29 MED ORDER — METOCLOPRAMIDE HCL 5 MG PO TABS: 5.0000 mg | ORAL_TABLET | Freq: Three times a day (TID) | ORAL | Status: DC | PRN

## 2016-06-29 MED ORDER — PHENYLEPHRINE HCL 10 MG/ML IJ SOLN
INTRAMUSCULAR | Status: DC | PRN
  Administered 2016-06-29 (×3): 40 ug via INTRAVENOUS

## 2016-06-29 MED ORDER — MIDAZOLAM HCL 5 MG/5ML IJ SOLN
INTRAMUSCULAR | Status: DC | PRN
  Administered 2016-06-29: 2 mg via INTRAVENOUS

## 2016-06-29 MED ORDER — DOCUSATE SODIUM 100 MG PO CAPS
100.0000 mg | ORAL_CAPSULE | Freq: Two times a day (BID) | ORAL | Status: DC
  Administered 2016-06-29 – 2016-06-30 (×3): 100 mg via ORAL
  Filled 2016-06-29 (×3): qty 1

## 2016-06-29 MED ORDER — PROPOFOL 10 MG/ML IV BOLUS
INTRAVENOUS | Status: AC
  Filled 2016-06-29: qty 20

## 2016-06-29 MED ORDER — ONDANSETRON HCL 4 MG/2ML IJ SOLN
INTRAMUSCULAR | Status: AC
  Filled 2016-06-29: qty 2

## 2016-06-29 MED ORDER — ROPIVACAINE HCL 5 MG/ML IJ SOLN
INTRAMUSCULAR | Status: DC | PRN
  Administered 2016-06-29: 20 mL via PERINEURAL

## 2016-06-29 MED ORDER — ASPIRIN EC 325 MG PO TBEC
325.0000 mg | DELAYED_RELEASE_TABLET | Freq: Every day | ORAL | Status: DC
  Administered 2016-06-30: 325 mg via ORAL
  Filled 2016-06-29 (×2): qty 1

## 2016-06-29 MED ORDER — FENTANYL CITRATE (PF) 100 MCG/2ML IJ SOLN
INTRAMUSCULAR | Status: AC
  Filled 2016-06-29: qty 4

## 2016-06-29 MED ORDER — BUPIVACAINE HCL (PF) 0.25 % IJ SOLN
INTRAMUSCULAR | Status: AC
  Filled 2016-06-29: qty 30

## 2016-06-29 MED ORDER — OMEPRAZOLE MAGNESIUM 20 MG PO TBEC: 20.0000 mg | DELAYED_RELEASE_TABLET | Freq: Every day | ORAL | Status: DC | PRN

## 2016-06-29 MED ORDER — BUPIVACAINE IN DEXTROSE 0.75-8.25 % IT SOLN
INTRATHECAL | Status: DC | PRN
  Administered 2016-06-29: 2 mL via INTRATHECAL

## 2016-06-29 MED ORDER — PANTOPRAZOLE SODIUM 40 MG PO TBEC
40.0000 mg | DELAYED_RELEASE_TABLET | Freq: Every day | ORAL | Status: DC | PRN
  Administered 2016-06-29: 40 mg via ORAL
  Filled 2016-06-29: qty 1

## 2016-06-29 MED ORDER — ALUM & MAG HYDROXIDE-SIMETH 200-200-20 MG/5ML PO SUSP
30.0000 mL | ORAL | Status: DC | PRN
  Administered 2016-06-30: 30 mL via ORAL
  Filled 2016-06-29: qty 30

## 2016-06-29 MED ORDER — CHLORHEXIDINE GLUCONATE 4 % EX LIQD: 60.0000 mL | Freq: Once | CUTANEOUS | Status: DC

## 2016-06-29 MED ORDER — MELATONIN 3 MG PO TABS
3.0000 mg | ORAL_TABLET | Freq: Every day | ORAL | Status: DC
  Administered 2016-06-29: 3 mg via ORAL
  Filled 2016-06-29: qty 1

## 2016-06-29 MED ORDER — BUPIVACAINE-EPINEPHRINE 0.25% -1:200000 IJ SOLN
INTRAMUSCULAR | Status: DC | PRN
  Administered 2016-06-29: 30 mL

## 2016-06-29 MED ORDER — ONDANSETRON HCL 4 MG PO TABS: 4.0000 mg | ORAL_TABLET | Freq: Four times a day (QID) | ORAL | Status: DC | PRN

## 2016-06-29 MED ORDER — OXYCODONE HCL 5 MG PO TABS: 5.0000 mg | ORAL_TABLET | Freq: Once | ORAL | Status: DC | PRN

## 2016-06-29 MED ORDER — ACETAMINOPHEN 650 MG RE SUPP: 650.0000 mg | Freq: Four times a day (QID) | RECTAL | Status: DC | PRN

## 2016-06-29 SURGICAL SUPPLY — 75 items
APL SKNCLS STERI-STRIP NONHPOA (GAUZE/BANDAGES/DRESSINGS) ×1
BANDAGE ESMARK 6X9 LF (GAUZE/BANDAGES/DRESSINGS) ×1 IMPLANT
BENZOIN TINCTURE PRP APPL 2/3 (GAUZE/BANDAGES/DRESSINGS) ×3 IMPLANT
BLADE SAGITTAL 25.0X1.19X90 (BLADE) ×2 IMPLANT
BLADE SAGITTAL 25.0X1.19X90MM (BLADE) ×1
BLADE SAW SGTL 13X75X1.27 (BLADE) ×3 IMPLANT
BLADE SURG 10 STRL SS (BLADE) ×6 IMPLANT
BNDG CMPR 9X6 STRL LF SNTH (GAUZE/BANDAGES/DRESSINGS) ×1
BNDG CMPR MED 15X6 ELC VLCR LF (GAUZE/BANDAGES/DRESSINGS) ×1
BNDG ELASTIC 6X15 VLCR STRL LF (GAUZE/BANDAGES/DRESSINGS) ×3 IMPLANT
BNDG ESMARK 6X9 LF (GAUZE/BANDAGES/DRESSINGS) ×3
BOWL SMART MIX CTS (DISPOSABLE) ×3 IMPLANT
CAPT KNEE TOTAL 3 ATTUNE ×2 IMPLANT
CEMENT HV SMART SET (Cement) ×6 IMPLANT
CLOSURE WOUND 1/2 X4 (GAUZE/BANDAGES/DRESSINGS) ×1
COVER SURGICAL LIGHT HANDLE (MISCELLANEOUS) ×3 IMPLANT
CUFF TOURNIQUET SINGLE 34IN LL (TOURNIQUET CUFF) ×3 IMPLANT
CUFF TOURNIQUET SINGLE 44IN (TOURNIQUET CUFF) IMPLANT
DECANTER SPIKE VIAL GLASS SM (MISCELLANEOUS) ×1 IMPLANT
DRAPE EXTREMITY T 121X128X90 (DRAPE) ×3 IMPLANT
DRAPE HALF SHEET 40X57 (DRAPES) ×3 IMPLANT
DRAPE INCISE IOBAN 66X45 STRL (DRAPES) ×1 IMPLANT
DRAPE PROXIMA HALF (DRAPES) ×3 IMPLANT
DRAPE U-SHAPE 47X51 STRL (DRAPES) ×3 IMPLANT
DRSG AQUACEL AG ADV 3.5X14 (GAUZE/BANDAGES/DRESSINGS) ×3 IMPLANT
DURAPREP 26ML APPLICATOR (WOUND CARE) ×4 IMPLANT
ELECT CAUTERY BLADE 6.4 (BLADE) ×3 IMPLANT
ELECT REM PT RETURN 9FT ADLT (ELECTROSURGICAL) ×3
ELECTRODE REM PT RTRN 9FT ADLT (ELECTROSURGICAL) ×1 IMPLANT
FACESHIELD WRAPAROUND (MASK) ×3 IMPLANT
FACESHIELD WRAPAROUND OR TEAM (MASK) ×1 IMPLANT
FILTER STRAW FLUID ASPIR (MISCELLANEOUS) ×2 IMPLANT
GLOVE BIO SURGEON STRL SZ7 (GLOVE) ×3 IMPLANT
GLOVE BIOGEL PI IND STRL 7.0 (GLOVE) ×1 IMPLANT
GLOVE BIOGEL PI IND STRL 7.5 (GLOVE) ×1 IMPLANT
GLOVE BIOGEL PI INDICATOR 7.0 (GLOVE) ×2
GLOVE BIOGEL PI INDICATOR 7.5 (GLOVE) ×2
GLOVE SS BIOGEL STRL SZ 7.5 (GLOVE) ×1 IMPLANT
GLOVE SUPERSENSE BIOGEL SZ 7.5 (GLOVE) ×2
GOWN STRL REUS W/ TWL LRG LVL3 (GOWN DISPOSABLE) ×1 IMPLANT
GOWN STRL REUS W/ TWL XL LVL3 (GOWN DISPOSABLE) ×2 IMPLANT
GOWN STRL REUS W/TWL LRG LVL3 (GOWN DISPOSABLE) ×3
GOWN STRL REUS W/TWL XL LVL3 (GOWN DISPOSABLE) ×6
HANDPIECE INTERPULSE COAX TIP (DISPOSABLE) ×3
HOOD PEEL AWAY FACE SHEILD DIS (HOOD) ×6 IMPLANT
IMMOBILIZER KNEE 22 UNIV (SOFTGOODS) ×3 IMPLANT
KIT BASIN OR (CUSTOM PROCEDURE TRAY) ×3 IMPLANT
KIT ROOM TURNOVER OR (KITS) ×3 IMPLANT
MANIFOLD NEPTUNE II (INSTRUMENTS) ×3 IMPLANT
MARKER SKIN DUAL TIP RULER LAB (MISCELLANEOUS) ×3 IMPLANT
NDL 18GX1X1/2 (RX/OR ONLY) (NEEDLE) ×1 IMPLANT
NEEDLE 18GX1X1/2 (RX/OR ONLY) (NEEDLE) ×9 IMPLANT
NS IRRIG 1000ML POUR BTL (IV SOLUTION) ×3 IMPLANT
PACK TOTAL JOINT (CUSTOM PROCEDURE TRAY) ×3 IMPLANT
PAD ARMBOARD 7.5X6 YLW CONV (MISCELLANEOUS) ×4 IMPLANT
SET HNDPC FAN SPRY TIP SCT (DISPOSABLE) ×1 IMPLANT
STRIP CLOSURE SKIN 1/2X4 (GAUZE/BANDAGES/DRESSINGS) ×2 IMPLANT
SUCTION FRAZIER HANDLE 10FR (MISCELLANEOUS) ×2
SUCTION TUBE FRAZIER 10FR DISP (MISCELLANEOUS) ×1 IMPLANT
SUT MNCRL AB 3-0 PS2 18 (SUTURE) ×3 IMPLANT
SUT VIC AB 0 CT1 27 (SUTURE) ×6
SUT VIC AB 0 CT1 27XBRD ANBCTR (SUTURE) ×2 IMPLANT
SUT VIC AB 1 CT1 27 (SUTURE) ×3
SUT VIC AB 1 CT1 27XBRD ANBCTR (SUTURE) ×1 IMPLANT
SUT VIC AB 2-0 CT1 27 (SUTURE) ×6
SUT VIC AB 2-0 CT1 TAPERPNT 27 (SUTURE) ×2 IMPLANT
SYR 30ML LL (SYRINGE) ×3 IMPLANT
SYR TB 1ML LUER SLIP (SYRINGE) ×2 IMPLANT
TOWEL OR 17X24 6PK STRL BLUE (TOWEL DISPOSABLE) ×3 IMPLANT
TOWEL OR 17X26 10 PK STRL BLUE (TOWEL DISPOSABLE) ×3 IMPLANT
TRAY CATH 16FR W/PLASTIC CATH (SET/KITS/TRAYS/PACK) IMPLANT
TRAY FOLEY CATH 16FR SILVER (SET/KITS/TRAYS/PACK) ×3 IMPLANT
TUBE CONNECTING 12'X1/4 (SUCTIONS) ×1
TUBE CONNECTING 12X1/4 (SUCTIONS) ×2 IMPLANT
YANKAUER SUCT BULB TIP NO VENT (SUCTIONS) ×3 IMPLANT

## 2016-06-29 NOTE — Evaluation (Signed)
Physical Therapy Evaluation Patient Details Name: Spencer [name] MRN: [id number] DOB: 1945/10/18 Today's Date: 06/29/2016   History of Present Illness  Pt admitted for elective L TKA. Pt with h/o multiple orthopedic injuries in the past.  Clinical Impression  Pt is s/p TKA resulting in the deficits listed below (see PT Problem List).Pt motivated and eager to d/c home tomorrow. Pt tolerated ambulation well. Pt will benefit from skilled PT to increase their independence and safety with mobility to allow discharge to the venue listed below.      Follow Up Recommendations Home health PT;Supervision/Assistance - 24 hour (may be able to go right to outpt PT)    Equipment Recommendations  None recommended by PT    Recommendations for Other Services       Precautions / Restrictions Precautions Precautions: Knee Precaution Booklet Issued: Yes (comment) Precaution Comments: pt with verbal understanding Required Braces or Orthoses: Knee Immobilizer - Left Knee Immobilizer - Left: On when out of bed or walking Restrictions Weight Bearing Restrictions: Yes LLE Weight Bearing: Weight bearing as tolerated      Mobility  Bed Mobility Overal bed mobility: Needs Assistance Bed Mobility: Supine to Sit     Supine to sit: Supervision     General bed mobility comments: pt able to complete long sit technique and manage LEs indep  Transfers Overall transfer level: Needs assistance Equipment used: Rolling walker (2 wheeled) Transfers: Sit to/from Stand Sit to Stand: Min guard         General transfer comment: v/c's for hand placement  Ambulation/Gait Ambulation/Gait assistance: Min guard Ambulation Distance (Feet): 120 Feet Assistive device: Rolling walker (2 wheeled) Gait Pattern/deviations: Step-to pattern Gait velocity: slow Gait velocity interpretation: Below normal speed for age/gender General Gait Details: pt with antalgic gait pattern with less than 25% WBing through L  LE, no buckling but had L KI on  Stairs            Wheelchair Mobility    Modified Rankin (Stroke Patients Only)       Balance Overall balance assessment: No apparent balance deficits (not formally assessed) (needs RW for safe amb due to recent surgery)                                           Pertinent Vitals/Pain Pain Assessment: 0-10 Pain Score: 2  Pain Location: L knee Pain Descriptors / Indicators: Sore Pain Intervention(s): Monitored during session    Home Living Family/patient expects to be discharged to:: Private residence Living Arrangements: Spouse/significant other Available Help at Discharge: Family;Available 24 hours/day Type of Home: House Home Access: Stairs to enter Entrance Stairs-Rails: Psychiatric nurse of Steps: 4 Home Layout: One level Home Equipment: Walker - 2 wheels;Tub bench      Prior Function Level of Independence: Independent               Hand Dominance   Dominant Hand: Right    Extremity/Trunk Assessment   Upper Extremity Assessment: Overall WFL for tasks assessed           Lower Extremity Assessment: LLE deficits/detail   LLE Deficits / Details: pt able to achieve quad set and 45 deg active knee flexion  Cervical / Trunk Assessment: Normal  Communication   Communication: No difficulties  Cognition Arousal/Alertness: Awake/alert Behavior During Therapy: WFL for tasks assessed/performed Overall Cognitive Status: Within Functional Limits for tasks  assessed                      General Comments General comments (skin integrity, edema, etc.): dressing intact    Exercises Total Joint Exercises Ankle Circles/Pumps: AROM;Strengthening;Both;10 reps;Supine Quad Sets: AROM;Strengthening;Both;10 reps;Supine Heel Slides: Right;10 reps;Supine;AAROM   Assessment/Plan    PT Assessment Patient needs continued PT services  PT Problem List Decreased strength;Decreased range of  motion;Decreased activity tolerance;Decreased balance;Decreased mobility          PT Treatment Interventions DME instruction;Gait training;Stair training;Functional mobility training;Therapeutic activities;Therapeutic exercise    PT Goals (Current goals can be found in the Care Plan section)  Acute Rehab PT Goals Patient Stated Goal: home PT Goal Formulation: With patient Time For Goal Achievement: 07/06/16 Potential to Achieve Goals: Good    Frequency 7X/week   Barriers to discharge        Co-evaluation               End of Session Equipment Utilized During Treatment: Gait belt;Left knee immobilizer Activity Tolerance: Patient tolerated treatment well Patient left: in chair;with call bell/phone within reach;with family/visitor present Nurse Communication: Mobility status         Time: [id number] PT Time Calculation (min) (ACUTE ONLY): 32 min   Charges:   PT Evaluation $PT Eval Moderate Complexity: 1 Procedure PT Treatments $Gait Training: 8-22 mins   PT G Codes:        Mayme Profeta M Alanah Sakuma 06/29/2016, 5:25 PM   Kittie Plater, PT, DPT Pager #: [phone] Office #: [phone]

## 2016-06-29 NOTE — Interval H&P Note (Signed)
History and Physical Interval Note:  06/29/2016 6:58 AM  Spencer [name]  has presented today for surgery, with the diagnosis of OA LEFT KNEE  The various methods of treatment have been discussed with the patient and family. After consideration of risks, benefits and other options for treatment, the patient has consented to  Procedure(s): LEFT TOTAL KNEE ARTHROPLASTY (Left) as a surgical intervention .  The patient's history has been reviewed, patient examined, no change in status, stable for surgery.  I have reviewed the patient's chart and labs.  Questions were answered to the patient's satisfaction.     [name] A

## 2016-06-29 NOTE — Anesthesia Procedure Notes (Signed)
Procedure Name: MAC Date/Time: 06/29/2016 7:30 AM Performed by: Carney Living Pre-anesthesia Checklist: Patient identified, Emergency Drugs available, Suction available, Patient being monitored and Timeout performed Oxygen Delivery Method: Simple face mask

## 2016-06-29 NOTE — Progress Notes (Signed)
Orthopedic Tech Progress Note Patient Details:  Spencer [name] Apr 27, 1946 [id number]  CPM Left Knee CPM Left Knee: On Left Knee Flexion (Degrees): 90 Left Knee Extension (Degrees): 0 Additional Comments: Foot roll   [place] Pink 06/29/2016, 11:08 AM

## 2016-06-29 NOTE — Anesthesia Procedure Notes (Signed)
Date/Time: 06/29/2016 6:56 AM Performed by: Carney Living Pre-anesthesia Checklist: Patient identified, Emergency Drugs available, Suction available, Patient being monitored and Timeout performed Oxygen Delivery Method: Nasal cannula

## 2016-06-29 NOTE — Anesthesia Procedure Notes (Signed)
Anesthesia Regional Block:  Adductor canal block  Pre-Anesthetic Checklist: ,, timeout performed, Correct Patient, Correct Site, Correct Laterality, Correct Procedure, Correct Position, site marked, Risks and benefits discussed,  Surgical consent,  Pre-op evaluation,  At surgeon's request and post-op pain management  Laterality: Left  Prep: chloraprep       Needles:  Injection technique: Single-shot  Needle Type: Echogenic Needle     Needle Length: 9cm 9 cm Needle Gauge: 21 and 21 G    Additional Needles:  Procedures: ultrasound guided (picture in chart) Adductor canal block Narrative:  Start time: 06/29/2016 7:01 AM End time: 06/29/2016 7:10 AM Injection made incrementally with aspirations every 5 mL.  Performed by: Personally  Anesthesiologist: Krisann Mckenna  Additional Notes: Pt tolerated the procedure well.

## 2016-06-29 NOTE — Anesthesia Postprocedure Evaluation (Signed)
Anesthesia Post Note  Patient: Spencer [name]  Procedure(s) Performed: Procedure(s) (LRB): LEFT TOTAL KNEE ARTHROPLASTY (Left)  Patient location during evaluation: PACU Anesthesia Type: Spinal Level of consciousness: oriented and awake and alert Pain management: pain level controlled Vital Signs Assessment: post-procedure vital signs reviewed and stable Respiratory status: spontaneous breathing, respiratory function stable and patient connected to nasal cannula oxygen Cardiovascular status: blood pressure returned to baseline and stable Postop Assessment: no headache and no backache Anesthetic complications: no    Last Vitals:  Vitals:   06/29/16 1052 06/29/16 1103  BP:  (!) 151/88  Pulse:  90  Resp:    Temp: 36.4 C 36.4 C    Last Pain:  Vitals:   06/29/16 1103  TempSrc: Oral  PainSc:                  [place] S

## 2016-06-29 NOTE — Transfer of Care (Signed)
Immediate Anesthesia Transfer of Care Note  Patient: VEIKKO BELOTTI  Procedure(s) Performed: Procedure(s): LEFT TOTAL KNEE ARTHROPLASTY (Left)  Patient Location: PACU  Anesthesia Type:MAC and Spinal  Level of Consciousness: awake, alert , oriented and patient cooperative  Airway & Oxygen Therapy: Patient Spontanous Breathing and Patient connected to nasal cannula oxygen  Post-op Assessment: Report given to RN and Post -op Vital signs reviewed and stable  Post vital signs: Reviewed and stable  Last Vitals:  Vitals:   06/10/16 1400 06/29/16 0610  BP: (!) 159/90 (!) 164/101  Pulse: 86 77  Resp:  18  Temp: 36.6 C 36.8 C    Last Pain:  Vitals:   06/29/16 0610  TempSrc: Oral      Patients Stated Pain Goal: 5 ([id number])  Complications: No apparent anesthesia complications

## 2016-06-29 NOTE — Op Note (Signed)
MRN:     [id number] DOB/AGE:    [birth year] / 70 y.o.       OPERATIVE REPORT    DATE OF PROCEDURE:  06/29/2016       PREOPERATIVE DIAGNOSIS:   PRIMARY LOCALIZED OA LEFT KNEE      Estimated body mass index is 24.14 kg/m as calculated from the following:   Height as of 06/19/16: 6' (1.829 m).   Weight as of this encounter: 80.7 kg (178 lb).                                                        POSTOPERATIVE DIAGNOSIS:   SAME                                                                     PROCEDURE:  Procedure(s): LEFT TOTAL KNEE ARTHROPLASTY Using Depuy Attune RP implants #7 Femur, #8Tibia, 72mm  RP bearing, 32 Patella     SURGEON: Koa Zoeller A    ASSISTANT:  Kirstin Shepperson PA-C   (Present and scrubbed throughout the case, critical for assistance with exposure, retraction, instrumentation, and closure.)         ANESTHESIA: Spinal with Adductor Nerve Block     TOURNIQUET TIME: 0000000   COMPLICATIONS:  None     SPECIMENS: None   INDICATIONS FOR PROCEDURE: The patient has  OA LEFT KNEE, varus deformities, XR shows bone on bone arthritis. Patient has failed all conservative measures including anti-inflammatory medicines, narcotics, attempts at  exercise and weight loss, cortisone injections and viscosupplementation.  Risks and benefits of surgery have been discussed, questions answered.   DESCRIPTION OF PROCEDURE: The patient identified by armband, received  right femoral nerve block and IV antibiotics, in the holding area at [facility]. Patient taken to the operating room, appropriate anesthetic  monitors were attached Spinal anesthesia induced with  the patient in supine position, Foley catheter was inserted. Tourniquet  applied high to the operative thigh. Lateral post and foot positioner  applied to the table, the lower extremity was then prepped and draped  in usual sterile fashion from the ankle to the tourniquet. Time-out procedure was performed. The limb was  wrapped with an Esmarch bandage and the tourniquet inflated to 365 mmHg. We began the operation by making the anterior midline incision starting at handbreadth above the patella going over the patella 1 cm medial to and  4 cm distal to the tibial tubercle. Small bleeders in the skin and the  subcutaneous tissue identified and cauterized. Transverse retinaculum was incised and reflected medially and a medial parapatellar arthrotomy was accomplished. the patella was everted and theprepatellar fat pad resected. The superficial medial collateral  ligament was then elevated from anterior to posterior along the proximal  flare of the tibia and anterior half of the menisci resected. The knee was hyperflexed exposing bone on bone arthritis. Peripheral and notch osteophytes as well as the cruciate ligaments were then resected. We continued to  work our way around posteriorly along the proximal tibia, and externally  rotated the tibia subluxing it out from underneath  the femur. A McHale  retractor was placed through the notch and a lateral Hohmann retractor  placed, and we then drilled through the proximal tibia in line with the  axis of the tibia followed by an intramedullary guide rod and 2-degree  posterior slope cutting guide. The tibial cutting guide was pinned into place  allowing resection of 4 mm of bone medially and about 6 mm of bone  laterally because of her varus deformity. Satisfied with the tibial resection, we then  entered the distal femur 2 mm anterior to the PCL origin with the  intramedullary guide rod and applied the distal femoral cutting guide  set at 56mm, with 5 degrees of valgus. This was pinned along the  epicondylar axis. At this point, the distal femoral cut was accomplished without difficulty. We then sized for a #7 femoral component and pinned the guide in 3 degrees of external rotation.The chamfer cutting guide was pinned into place. The anterior, posterior, and chamfer cuts were  accomplished without difficulty followed by  the  RP box cutting guide and the box cut. We also removed posterior osteophytes from the posterior femoral condyles. At this  time, the knee was brought into full extension. We checked our  extension and flexion gaps and found them symmetric at 58mm.  The patella thickness measured at 25 mm. We set the cutting guide at 15 and removed the posterior 9.5-10 mm  of the patella sized for 32 button and drilled the lollipop. The knee  was then once again hyperflexed exposing the proximal tibia. We sized for a #8 tibial base plate, applied the smokestack and the conical reamer followed by the the Delta fin keel punch. We then hammered into place the  RP trial femoral component, inserted a 1 trial bearing, trial patellar button, and took the knee through range of motion from 0-130 degrees. No thumb pressure was required for patellar  tracking. At this point, all trial components were removed, a double batch of DePuy HV cement  was mixed and applied to all bony metallic mating surfaces except for the posterior condyles of the femur itself. In order, we  hammered into place the tibial tray and removed excess cement, the femoral component and removed excess cement, a 71mm  RP bearing  was inserted, and the knee brought to full extension with compression.  The patellar button was clamped into place, and excess cement  removed. While the cement cured the wound was irrigated out with normal saline solution pulse lavage.. Ligament stability and patellar tracking were checked and found to be excellent.. The parapatellar arthrotomy was closed with  #1 Vicryl suture. The subcutaneous tissue with 0 and 2-0 undyed  Vicryl suture, and 4-0 Monocryl.. A dressing of Aquaseal,  4 x 4, dressing sponges, Webril, and Ace wrap applied. Needle and sponge count were correct times 2.The patient awakened, extubated, and taken to recovery room without difficulty. Vascular status was normal,  pulses 2+ and symmetric.   Secret Kristensen A 06/29/2016, 8:51 AM

## 2016-06-29 NOTE — Anesthesia Procedure Notes (Signed)
Spinal  Patient location during procedure: OR Start time: 06/29/2016 7:15 AM End time: 06/29/2016 7:20 AM Staffing Anesthesiologist: Marcie Bal, Jenisis Harmsen Performed: anesthesiologist  Preanesthetic Checklist Completed: patient identified, site marked, surgical consent, pre-op evaluation, timeout performed, IV checked, risks and benefits discussed and monitors and equipment checked Spinal Block Patient position: sitting Prep: Betadine and site prepped and draped Patient monitoring: heart rate, cardiac monitor, continuous pulse ox and blood pressure Approach: midline Location: L3-4 Injection technique: single-shot Needle Needle type: Pencan  Needle gauge: 24 G Needle length: 9 cm Assessment Sensory level: T8 Additional Notes Pt tolerated the procedure well.

## 2016-06-29 NOTE — Anesthesia Preprocedure Evaluation (Addendum)
Anesthesia Evaluation  Patient identified by MRN, date of birth, ID band Patient awake    Reviewed: Allergy & Precautions, H&P , NPO status , Patient's Chart, lab work & pertinent test results  Airway Mallampati: II   Neck ROM: full    Dental  (+) Teeth Intact, Dental Advisory Given   Pulmonary former smoker,    breath sounds clear to auscultation       Cardiovascular negative cardio ROS   Rhythm:regular Rate:Normal     Neuro/Psych    GI/Hepatic GERD  Medicated and Controlled,  Endo/Other    Renal/GU      Musculoskeletal  (+) Arthritis ,   Abdominal   Peds  Hematology   Anesthesia Other Findings   Reproductive/Obstetrics                            Anesthesia Physical Anesthesia Plan  ASA: II  Anesthesia Plan: MAC, Regional and Spinal   Post-op Pain Management:  Regional for Post-op pain   Induction: Intravenous  Airway Management Planned: Simple Face Mask  Additional Equipment:   Intra-op Plan:   Post-operative Plan:   Informed Consent: I have reviewed the patients History and Physical, chart, labs and discussed the procedure including the risks, benefits and alternatives for the proposed anesthesia with the patient or authorized representative who has indicated his/her understanding and acceptance.   Dental advisory given  Plan Discussed with: CRNA, Anesthesiologist and Surgeon  Anesthesia Plan Comments:        Anesthesia Quick Evaluation

## 2016-06-30 ENCOUNTER — Encounter (HOSPITAL_COMMUNITY): Payer: Self-pay | Admitting: Orthopedic Surgery

## 2016-06-30 LAB — BASIC METABOLIC PANEL
Anion gap: 9 (ref 5–15)
BUN: 11 mg/dL (ref 6–20)
CALCIUM: 8.4 mg/dL — AB (ref 8.9–10.3)
CO2: 24 mmol/L (ref 22–32)
CREATININE: 0.84 mg/dL (ref 0.61–1.24)
Chloride: 104 mmol/L (ref 101–111)
GFR calc Af Amer: >60 mL/min (ref >60)
Glucose, Bld: 145 mg/dL — ABNORMAL HIGH (ref 65–99)
POTASSIUM: 4.1 mmol/L (ref 3.5–5.1)
SODIUM: 137 mmol/L (ref 135–145)

## 2016-06-30 LAB — CBC
HCT: 35.7 % — ABNORMAL LOW (ref 39.0–52.0)
Hemoglobin: 12.4 g/dL — ABNORMAL LOW (ref 13.0–17.0)
MCH: 32.9 pg (ref 26.0–34.0)
MCHC: 34.7 g/dL (ref 30.0–36.0)
MCV: 94.7 fL (ref 78.0–100.0)
PLATELETS: 157 10*3/uL (ref 150–400)
RBC: 3.77 MIL/uL — AB (ref 4.22–5.81)
RDW: 11.8 % (ref 11.5–15.5)
WBC: 14 10*3/uL — ABNORMAL HIGH (ref 4.0–10.5)

## 2016-06-30 MED ORDER — POLYETHYLENE GLYCOL 3350 17 G PO PACK
PACK | ORAL | 0 refills | Status: DC
Start: 2016-06-30 — End: 2017-05-19

## 2016-06-30 MED ORDER — ASPIRIN 325 MG PO TBEC: DELAYED_RELEASE_TABLET | ORAL | 0 refills | Status: DC

## 2016-06-30 MED ORDER — DOCUSATE SODIUM 100 MG PO CAPS: ORAL_CAPSULE | ORAL | 0 refills | Status: DC

## 2016-06-30 MED ORDER — OXYCODONE HCL 5 MG PO TABS: ORAL_TABLET | ORAL | 0 refills | Status: DC

## 2016-06-30 NOTE — Progress Notes (Signed)
Orthopedic Tech Progress Note Patient Details:  Spencer [name] 07/21/1946 [id number]  Patient ID: [name], male   DOB: April 08, 1946, 70 y.o.   MRN: [id number] Applied cpm 0-60  Karolee Stamps 06/30/2016, 5:28 AM

## 2016-06-30 NOTE — Care Management Note (Signed)
Case Management Note  Patient Details  Name: JAMIKAL HOMMES MRN: [id number] Date of Birth: Dec 25, 1945  Subjective/Objective:    70 yr old gentleman s/p left total knee arthroplasty.                Action/Plan: Case manager spoke with patient and wife concerning discharge plan. He will go to [facility] on Wednesday, 07/01/16 @ 10:30am. He will have family support at discharge.    Expected Discharge Date:    06/30/16              Expected Discharge Plan:  Home/Self Care  In-House Referral:  NA  Discharge planning Services  CM Consult  Post Acute Care Choice:  NA Choice offered to:  Patient, Spouse  DME Arranged:   (RW, 3in1, CPM have been delivered to patients home) DME Agency:  [employer]  HH Arranged:  NA HH Agency:  NA  Status of Service:  Completed, signed off  If discussed at [place] of Stay Meetings, dates discussed:    Additional Comments:  Ninfa Meeker, RN 06/30/2016, 11:45 AM

## 2016-06-30 NOTE — Progress Notes (Signed)
Physical Therapy Treatment Patient Details Name: Spencer [name] MRN: [id number] DOB: 1945/12/26 Today's Date: 06/30/2016    History of Present Illness Pt admitted for elective L TKA. Pt with h/o multiple orthopedic injuries in the past.    PT Comments    Pt progressing well and was able to tolerate ambulation and stair negotiation at supervision level. Wife present during stair negotiation and pt and spouse with good understanding. Pt motivated and moving well. Would benefit from transitioning right to outpt PT.   Follow Up Recommendations  Outpatient PT;Supervision - Intermittent     Equipment Recommendations  None recommended by PT    Recommendations for Other Services       Precautions / Restrictions Precautions Precautions: Knee Precaution Booklet Issued: Yes (comment) Precaution Comments: re-educated on HEP Restrictions Weight Bearing Restrictions: Yes LLE Weight Bearing: Weight bearing as tolerated    Mobility  Bed Mobility               General bed mobility comments: pt up in chair upon PT arrival  Transfers Overall transfer level: Needs assistance Equipment used: Rolling walker (2 wheeled) Transfers: Sit to/from Stand Sit to Stand: Supervision         General transfer comment: good technique  Ambulation/Gait Ambulation/Gait assistance: Supervision Ambulation Distance (Feet): 160 Feet Assistive device: Rolling walker (2 wheeled) Gait Pattern/deviations: Step-through pattern Gait velocity: wfl for surgery   General Gait Details: v/c's to optimize gait pattern, heel/toe on the L LE and to increase L knee flexion during swing phase. pt reports placing approx 50% of weight through L LE. v/cs to relax shoulders and decrease dependency on RW   Stairs Stairs: Yes Stairs assistance: Min guard Stair Management: One rail Left;Step to pattern;Sideways Number of Stairs: 2 (x3 trials) General stair comments: also completed a platform step to mimic step  into house after set of 4 sets to deck  Wheelchair Mobility    Modified Rankin (Stroke Patients Only)       Balance Overall balance assessment: No apparent balance deficits (not formally assessed)                                  Cognition Arousal/Alertness: Awake/alert Behavior During Therapy: WFL for tasks assessed/performed Overall Cognitive Status: Within Functional Limits for tasks assessed                      Exercises      General Comments General comments (skin integrity, edema, etc.): dressing intact, pt mod I with standing to urinate and wash hands      Pertinent Vitals/Pain Pain Assessment: 0-10 Pain Score: 3  Pain Location: L knee Pain Descriptors / Indicators: Sore Pain Intervention(s): Monitored during session    Home Living                      Prior Function            PT Goals (current goals can now be found in the care plan section) Acute Rehab PT Goals Patient Stated Goal: home Progress towards PT goals: Progressing toward goals    Frequency    7X/week      PT Plan Discharge plan needs to be updated    Co-evaluation             End of Session Equipment Utilized During Treatment: Gait belt Activity Tolerance: Patient tolerated treatment well Patient  left: in chair;with family/visitor present     Time: 1113-1140 PT Time Calculation (min) (ACUTE ONLY): 27 min  Charges:  $Gait Training: 8-22 mins $Therapeutic Activity: 8-22 mins                    G Codes:      Spencer [name] 07/17/16, 11:46 AM   Kittie Plater, PT, DPT Pager #: [phone] Office #: [phone]

## 2016-06-30 NOTE — Discharge Summary (Signed)
Patient ID: Spencer [name] MRN: [id number] DOB/AGE: 68-Apr-1947 68 y.o.  Admit date: 06/29/2016 Discharge date: 06/30/2016  Admission Diagnoses:  Principal Problem:   Primary localized osteoarthritis of left knee   Discharge Diagnoses:  Same  Past Medical History:  Diagnosis Date  . 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 Fracture    ribs/ right wrist x3/ left ankle/cracked vertebrae-cervical/  . GERD (gastroesophageal reflux disease)   . Medical history non-contributory   . Primary localized osteoarthritis of left knee     Surgeries: Procedure(s): LEFT TOTAL KNEE ARTHROPLASTY on 06/29/2016   Consultants:   Discharged Condition: Improved  Hospital Course: Spencer [name] is an 70 y.o. male who was admitted 06/29/2016 for operative treatment ofPrimary localized osteoarthritis of left knee. Patient has severe unremitting pain that affects sleep, daily activities, and work/hobbies. After pre-op clearance the patient was taken to the operating room on 06/29/2016 and underwent  Procedure(s): LEFT TOTAL KNEE ARTHROPLASTY.    Patient was given perioperative antibiotics:  Anti-infectives    Start     Dose/Rate Route Frequency Ordered Stop   06/29/16 1300  ceFAZolin (ANCEF) IVPB 2g/100 mL premix     2 g 200 mL/hr over 30 Minutes Intravenous Every 6 hours 06/29/16 1109 06/29/16 1930   06/29/16 0519  ceFAZolin (ANCEF) IVPB 2g/100 mL premix     2 g 200 mL/hr over 30 Minutes Intravenous On call to O.R. 06/29/16 0519 06/29/16 0726       Patient was given sequential compression devices, early ambulation, and chemoprophylaxis to prevent DVT.  Patient benefited maximally from hospital stay and there were no complications.    Recent vital signs:  Patient Vitals for the past 24 hrs:  BP Temp Temp src Pulse Resp SpO2  06/30/16 0300 119/72 98.1 F (36.7 C) Oral 88 16 96 %  06/30/16 0014 (!) 166/98 98.6 F (37 C) Oral 90 16 97 %  06/29/16 2012 127/83 97.8 F  (36.6 C) Oral 88 16 96 %  06/29/16 1501 (!) 157/95 - - 97 - 95 %  06/29/16 1326 (!) 157/102 - - - - -  06/29/16 1324 (!) 169/98 98 F (36.7 C) Oral (!) 104 - 96 %  06/29/16 1103 (!) 151/88 97.5 F (36.4 C) Oral 90 - 98 %  06/29/16 1052 - 97.5 F (36.4 C) - - - -  06/29/16 1000 (!) 110/93 - - - - -  06/29/16 0945 (!) 133/91 - - - - -  06/29/16 0930 (!) 133/92 - - - - -     Recent laboratory studies:   Recent Labs  06/30/16 0447  WBC 14.0*  HGB 12.4*  HCT 35.7*  PLT 157  NA 137  K 4.1  CL 104  CO2 24  BUN 11  CREATININE 0.84  GLUCOSE 145*  CALCIUM 8.4*     Discharge Medications:     Medication List    STOP taking these medications   DHEA 50 50 MG Caps Generic drug:  Prasterone (DHEA)   OVER THE COUNTER MEDICATION   trolamine salicylate 10 % cream Commonly known as:  ASPERCREME     TAKE these medications   aspirin 325 MG EC tablet 1 tab a day for the next 30 days to prevent blood clots What changed:  medication strength  how much to take  how to take this  when to take this  additional instructions   docusate sodium 100 MG capsule Commonly known as:  COLACE 1 tab 2 times a day while on narcotics.  STOOL SOFTENER   famotidine 10 MG chewable tablet Commonly known as:  PEPCID AC Chew 10 mg by mouth at bedtime.   Fish Oil 1200 MG Caps Take 1,200 mg by mouth daily with supper.   Melatonin 3 MG Tabs Take 3 mg by mouth at bedtime.   multivitamin with minerals Tabs tablet Take 1 tablet by mouth daily.   omeprazole 20 MG tablet Commonly known as:  PRILOSEC OTC Take 20 mg by mouth daily as needed (for late night snacking).   oxyCODONE 5 MG immediate release tablet Commonly known as:  Oxy IR/ROXICODONE 1-2 tablets every 4-6 hrs as needed for pain   polyethylene glycol packet Commonly known as:  MIRALAX / GLYCOLAX 17grams in 6 oz of water twice a day until bowel movement.  LAXITIVE.  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 The antibiotics were administered within an appropriate time frame prior to the initiation of the procedure. ANESTHESIA/SEDATION: None. COMPLICATIONS: None immediate. PROCEDURE: Informed written consent was obtained from the patient after a thorough discussion of the procedural risks, benefits and alternatives. All questions were addressed. Maximal Sterile Barrier Technique was utilized including caps, mask, sterile gowns, sterile gloves, sterile drape, hand hygiene and skin antiseptic. A timeout was performed prior to the initiation of the procedure. Preliminary ultrasound performed of the posterior right knee. Baker cyst was localized and skin was marked. Under sterile conditions and local anesthesia, an 18 gauge needle was advanced into the right knee Baker cyst. Needle position confirmed with ultrasound. Syringe aspiration yielded 28 cc synovial fluid. Following this, therapeutic injection performed with injection of 160 mg Depo-Medrol diluted in 3 cc of 0.25% bupivacaine. Needle removed. No immediate complication. Patient tolerated the procedure well. IMPRESSION: Successful ultrasound right knee Baker's cyst aspiration and therapeutic injection. Electronically Signed   By: Jerilynn Mages.  Shick M.D.   On: 06/18/2016 10:41    Disposition: 01-Home or Self Care  Discharge Instructions    CPM    Complete by:  As directed    Continuous passive motion machine (CPM):      Use the CPM from 0 to 90 for 6 hours per day.       You may break it up into 2 or 3 sessions per day.      Use CPM for 2 weeks or until you are told to stop.   Call MD / Call 911    Complete by:  As directed    If you experience chest pain or shortness of breath, CALL 911 and be transported to the  hospital emergency room.  If you develope a fever above 101 F, pus (white drainage) or increased drainage or redness at the wound, or calf pain, call your surgeon's office.   Change dressing    Complete by:  As directed    DO NOT REMOVE BANDAGE OVER SURGICAL INCISION.  [place] WHOLE LEG INCLUDING OVER THE WATERPROOF BANDAGE WITH SOAP AND WATER EVERY DAY.   Constipation Prevention    Complete by:  As directed    Drink plenty of fluids.  Prune juice may be helpful.  You may use a stool softener, such as Colace (over the counter) 100 mg twice a day.  Use  MiraLax (over the counter) for constipation as needed.   Diet - low sodium heart healthy    Complete by:  As directed    Discharge instructions    Complete by:  As directed    INSTRUCTIONS AFTER JOINT REPLACEMENT   Remove items at home which could result in a fall. This includes throw rugs or furniture in walking pathways ICE to the affected joint every three hours while awake for 30 minutes at a time, for at least the first 3-5 days, and then as needed for pain and swelling.  Continue to use ice for pain and swelling. You may notice swelling that will progress down to the foot and ankle.  This is normal after surgery.  Elevate your leg when you are not up walking on it.   Continue to use the breathing machine you got in the hospital (incentive spirometer) which will help keep your temperature down.  It is common for your temperature to cycle up and down following surgery, especially at night when you are not up moving around and exerting yourself.  The breathing machine keeps your lungs expanded and your temperature down.   DIET:  As you were doing prior to hospitalization, we recommend a well-balanced diet.  DRESSING / WOUND CARE / SHOWERING  Keep the surgical dressing until follow up.  The dressing is water proof, so you can shower without any extra covering.  IF THE DRESSING FALLS OFF or the wound gets wet inside, change the dressing with sterile  gauze.  Please use good hand washing techniques before changing the dressing.  Do not use any lotions or creams on the incision until instructed by your surgeon.    ACTIVITY  Increase activity slowly as tolerated, but follow the weight bearing instructions below.   No driving for 6 weeks or until further direction given by your physician.  You cannot drive while taking narcotics.  No lifting or carrying greater than 10 lbs. until further directed by your surgeon. Avoid periods of inactivity such as sitting longer than an hour when not asleep. This helps prevent blood clots.  You may return to work once you are authorized by your doctor.     WEIGHT BEARING   Weight bearing as tolerated with assist device (walker, cane, etc) as directed, use it as long as suggested by your surgeon or therapist, typically at least 2-3 weeks.   EXERCISES  Results after joint replacement surgery are often greatly improved when you follow the exercise, range of motion and muscle strengthening exercises prescribed by your doctor. Safety measures are also important to protect the joint from further injury. Any time any of these exercises cause you to have increased pain or swelling, decrease what you are doing until you are comfortable again and then slowly increase them. If you have problems or questions, call your caregiver or physical therapist for advice.   Rehabilitation is important following a joint replacement. After just a few days of immobilization, the muscles of the leg can become weakened and shrink (atrophy).  These exercises are designed to build up the tone and strength of the thigh and leg muscles and to improve motion. Often times heat used for twenty to thirty minutes before working out will loosen up your tissues and help with improving the range of motion but do not use heat for the first two weeks following surgery (sometimes heat can increase post-operative swelling).   These exercises can be  done on a training (exercise) mat, on  the floor, on a table or on a bed. Use whatever works the best and is most comfortable for you.    Use music or television while you are exercising so that the exercises are a pleasant break in your day. This will make your life better with the exercises acting as a break in your routine that you can look forward to.   Perform all exercises about fifteen times, three times per day or as directed.  You should exercise both the operative leg and the other leg as well.   Exercises include:  Quad Sets - Tighten up the muscle on the front of the thigh (Quad) and hold for 5-10 seconds.   Straight Leg Raises - With your knee straight (if you were given a brace, keep it on), lift the leg to 60 degrees, hold for 3 seconds, and slowly lower the leg.  Perform this exercise against resistance later as your leg gets stronger.  Leg Slides: Lying on your back, slowly slide your foot toward your buttocks, bending your knee up off the floor (only go as far as is comfortable). Then slowly slide your foot back down until your leg is flat on the floor again.  Angel Wings: Lying on your back spread your legs to the side as far apart as you can without causing discomfort.  Hamstring Strength:  Lying on your back, push your heel against the floor with your leg straight by tightening up the muscles of your buttocks.  Repeat, but this time bend your knee to a comfortable angle, and push your heel against the floor.  You may put a pillow under the heel to make it more comfortable if necessary.   A rehabilitation program following joint replacement surgery can speed recovery and prevent re-injury in the future due to weakened muscles. Contact your doctor or a physical therapist for more information on knee rehabilitation.    CONSTIPATION  Constipation is defined medically as fewer than three stools per week and severe constipation as less than one stool per week.  Even if you have a regular  bowel pattern at home, your normal regimen is likely to be disrupted due to multiple reasons following surgery.  Combination of anesthesia, postoperative narcotics, change in appetite and fluid intake all can affect your bowels.   YOU MUST use at least one of the following options; they are listed in order of increasing strength to get the job done.  They are all available over the counter, and you may need to use some, POSSIBLY even all of these options:    Drink plenty of fluids (prune juice may be helpful) and high fiber foods Colace 100 mg by mouth twice a day  Senokot for constipation as directed and as needed Dulcolax (bisacodyl), take with full glass of water  Miralax (polyethylene glycol) once or twice a day as needed.  If you have tried all these things and are unable to have a bowel movement in the first 3-4 days after surgery call either your surgeon or your primary doctor.    If you experience loose stools or diarrhea, hold the medications until you stool forms back up.  If your symptoms do not get better within 1 week or if they get worse, check with your doctor.  If you experience "the worst abdominal pain ever" or develop nausea or vomiting, please contact the office immediately for further recommendations for treatment.   ITCHING:  If you experience itching with your medications, try taking only  a single pain pill, or even half a pain pill at a time.  You can also use Benadryl over the counter for itching or also to help with sleep.   TED HOSE STOCKINGS:  Use stockings on both legs until for at least 2 weeks or as directed by physician office. They may be removed at night for sleeping.  MEDICATIONS:  See your medication summary on the "After Visit Summary" that nursing will review with you.  You may have some home medications which will be placed on hold until you complete the course of blood thinner medication.  It is important for you to complete the blood thinner medication as  prescribed.  PRECAUTIONS:  If you experience chest pain or shortness of breath - call 911 immediately for transfer to the hospital emergency department.   If you develop a fever greater that 101 F, purulent drainage from wound, increased redness or drainage from wound, foul odor from the wound/dressing, or calf pain - CONTACT YOUR SURGEON.                                                   FOLLOW-UP APPOINTMENTS:  If you do not already have a post-op appointment, please call the office for an appointment to be seen by your surgeon.  Guidelines for how soon to be seen are listed in your "After Visit Summary", but are typically between 1-4 weeks after surgery.  OTHER INSTRUCTIONS:   Knee Replacement:  Do not place pillow under knee, focus on keeping the knee straight while resting. CPM instructions: 0-90 degrees, 2 hours in the morning, 2 hours in the afternoon, and 2 hours in the evening. Place foam block, curve side up under heel at all times except when in CPM or when walking.  DO NOT modify, tear, cut, or change the foam block in any way.  MAKE SURE YOU:  Understand these instructions.  Get help right away if you are not doing well or get worse.    Thank you for letting us be a part of your medical care team.  It is a privilege we respect greatly.  We hope these instructions will help you stay on track for a fast and full recovery!   Do not put a pillow under the knee. Place it under the heel.    Complete by:  As directed    Place gray foam block, curve side up under heel at all times except when in CPM or when walking.  DO NOT modify, tear, cut, or change in any way the gray foam block.   Increase activity slowly as tolerated    Complete by:  As directed    Patient may shower    Complete by:  As directed    Aquacel dressing is water proof    Wash over it and the whole leg with soap and water at the end of your shower   TED hose    Complete by:  As directed    Use stockings (TED hose)  for 2 weeks on both leg(s).  You may remove them at night for sleeping.      Follow-up Information    Lorn Junes, MD Follow up on 07/14/2016.   Specialty:  Orthopedic Surgery Why:  appt time 3 pm Contact information: [address] [place] [address]  [facility] Follow up on 07/01/2016.   Specialty:  Rehabilitation Why:  appt time 10:30   arrive at 10:15 for paperwork.  PT appt with Cheral [facility].   Contact information: [id number]udy Fe[place]           Signed: Linda Hedges 06/30/2016, 9:24 AM

## 2016-07-01 ENCOUNTER — Other Ambulatory Visit: Payer: Self-pay | Admitting: Physician Assistant

## 2016-07-01 ENCOUNTER — Ambulatory Visit (HOSPITAL_COMMUNITY): Payer: Medicare Other | Attending: Orthopedic Surgery

## 2016-07-01 DIAGNOSIS — M25562 Pain in left knee: Secondary | ICD-10-CM | POA: Diagnosis not present

## 2016-07-01 DIAGNOSIS — M1712 Unilateral primary osteoarthritis, left knee: Secondary | ICD-10-CM

## 2016-07-01 DIAGNOSIS — Z96652 Presence of left artificial knee joint: Secondary | ICD-10-CM

## 2016-07-01 DIAGNOSIS — R262 Difficulty in walking, not elsewhere classified: Secondary | ICD-10-CM | POA: Insufficient documentation

## 2016-07-01 DIAGNOSIS — M6281 Muscle weakness (generalized): Secondary | ICD-10-CM | POA: Diagnosis present

## 2016-07-01 NOTE — Therapy (Signed)
[place], [name], [address] Phone: [phone]   Fax:  [phone]  Physical Therapy Treatment  Patient Details  Name: Spencer [name] MRN: [id number] Date of Birth: [birth year] Referring Provider: [name]   Encounter Date: 07/01/2016      PT End of Session - 07/01/16 1249    Visit Number 1   Number of Visits 18   Date for PT Re-Evaluation 07/31/16   Authorization Type UHC medicare    Authorization Time Period 07/01/16-08/31/16   Authorization - Visit Number 1   Authorization - Number of Visits 10   PT Start Time [id number]   PT Stop Time 1115   PT Time Calculation (min) 43 min   Activity Tolerance Patient tolerated treatment well;Patient limited by fatigue;Patient limited by pain   Behavior During Therapy [facility] for tasks assessed/performed      Past Medical History:  Diagnosis Date  . 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 Fracture    ribs/ right wrist x3/ left ankle/cracked vertebrae-cervical/  . GERD (gastroesophageal reflux disease)   . Medical history non-contributory   . Primary localized osteoarthritis of left knee     Past Surgical History:  Procedure Laterality Date  . BASAL CELL CARCINOMA EXCISION    . COLONOSCOPY  07/04/2004   RMR: Diminutive rectal polyps, removed with cold biopsy forceps, otherwise normal/  Minimal left-sided diverticula, the remainder of the colonic mucosa normal. Hyerplastic  . COLONOSCOPY N/A 03/27/2013   Dr.Rourk-prominent internal hemorrhoids o/w normal rectum. scattered sigmoid diverticula with associated areas of submucosal petechiae. two 51mm diminutive polypoid lesions opposite the ileocecal valve the remainder of the colonic mucosa appeared normal. bx= tubular adenoma, benign hyperemic colorectal mucosa with focal active colitis  . FOOT SURGERY Left    X 2-otif  . FOOT SURGERY Right 2003  . HEMORRHOID BANDING    . KNEE SURGERY Left 1973   open  . OLECRANON BURSECTOMY Right 03/29/2014   Procedure: EXCISION OF RIGHT OLECRANON BURSA;  Surgeon: [name], MD;  Location: [facility];  Service: Orthopedics;  Laterality: Right;  . TOTAL KNEE ARTHROPLASTY Left 06/29/2016   Procedure: LEFT TOTAL KNEE ARTHROPLASTY;  Surgeon: [name], MD;  Location: [place];  Service: Orthopedics;  Laterality: Left;    There were no vitals filed for this visit.      Subjective Assessment - 07/01/16 1038    Subjective Pt reports he was DC from hospital yesterday, s/p left TKA on 11/27. He's been using his CPM at homem 6hours/day (1 day so far).    Pertinent History Multiple fractures from sports over the years. History of left knee surgery in 1974 for debridement.     How long can you sit comfortably? Not sure.    How long can you stand comfortably? No issues at this time.    How long can you walk comfortably? Less than 563ft.    Patient Stated Goals Return to hunting and playing golf.    Currently in Pain? No/denies            [facility] PT Assessment - 07/01/16 0001      Assessment   Medical Diagnosis s/p Left TKA    Referring Provider [name]    Onset Date/Surgical Date 06/29/16   Hand Dominance Right   Next MD Visit 07/14/16   Prior Therapy Acute care only     Precautions   Precautions Knee   Precaution Booklet  Issued --  in acute   Required Braces or Orthoses --  none     Restrictions   Weight Bearing Restrictions Yes   LLE Weight Bearing Weight bearing as tolerated     Balance Screen   Has the patient fallen in the past 6 months Yes   How many times? 1  while playing golf   Has the patient had a decrease in activity level because of a fear of falling?  Yes   Is the patient reluctant to leave their home because of a fear of falling?  No     Prior Function   Level of Independence Independent   Vocation Retired     ROM / Strength   AROM / PROM / Strength PROM     PROM   PROM Assessment Site Knee   Right/Left  Knee Left   Left Knee Extension 14   Left Knee Flexion 89     Ambulation/Gait   Ambulation/Gait Yes   Ambulation/Gait Assistance 6: Modified independent (Device/Increase time)   Ambulation Distance (Feet) 100 Feet   Assistive device Rolling walker   Gait Pattern Step-through pattern   Gait velocity Maximal Gait Speed: 0.68m/s   Gait Comments VC for equal step width/length, and heel strike.   RW height adjustment, and VC for upright posture.                      [facility] PT Treatment/Exercise - 07/01/16 0001      Exercises   Exercises Knee/Hip     Knee/Hip Exercises: Supine   Quad Sets 10 reps;1 set  10x5sec hold (HEP)    Short Arc Quad Sets Left;10 reps;AROM;Strengthening  HEP education   Heel Slides 10 reps;1 set;Left;AROM  10x5sec   Bridges Both;Strengthening;1 set;15 reps  HEP education   Straight Leg Raises 10 reps;Left;Strengthening;AAROM  Eccentrics, with belt assistance   Knee Extension Limitations --  14   Knee Flexion Limitations 89   Other Supine Knee/Hip Exercises lateral abduction heel slides: 1x15 (HEP)   Other Supine Knee/Hip Exercises bent knee raise: 1x10 (HEP)                PT Education - 07/01/16 1248    Education provided Yes   Education Details HEP and swelling management.    Person(s) Educated Patient   Methods Explanation   Comprehension Verbalized understanding          PT Short Term Goals - 07/01/16 2131      PT SHORT TERM GOAL #1   Title After 4 weeks patient will demonstrate improved Left knee ROM AEB Lt knee flexion 7-113 degrees to improve safety getting into bathtub.    Status New     PT SHORT TERM GOAL #2   Title After 4 weeks patient will demonstrate ability to tolerate 6 minutes sustained AMB with overall gait speed >1.58m/s to improve safety with household distance AMB.    Status New     PT SHORT TERM GOAL #3   Title After 4 weeks patient will demonstrate imrpoved strength in Lt LE AEB MMT4/5 or  greater to restore to PLOF.    Status New           PT Long Term Goals - 07/01/16 2135      PT LONG TERM GOAL #1   Title After 8 weeks patient will demonstrate improved Left knee ROM AEB Lt knee flexion 4-121 degrees to improve safety getting into bathtub.  Status New     PT LONG TERM GOAL #2   Title After 8 weeks patient will demonstrate ability to tolerate 6 minutes sustained AMB with overall gait speed >1.3 m/s to improve ease of IADL and return to golfing.   Status New     PT LONG TERM GOAL #3   Title After 8 weeks patient will demonstrate imrpoved strength in Lt LE AEB MMT4+/5 or greater to restore to PLOF.    Status New               Plan - 07-21-16 1252    Clinical Impression Statement Pt presenting on POD 3 s/p Left TKA. Pt presents with postop edema WNL, ROM limitations, strength impairment, pain, decreased activity tolerance and difficulty with simple mobility required for basic ADL.     Rehab Potential Good   PT Frequency 2x / week   PT Duration 8 weeks   PT Treatment/Interventions Therapeutic activities;Moist Heat;Electrical Stimulation;Cryotherapy;DME Instruction;Gait training;Stair training;Functional mobility training;Dry needling;Manual techniques;Scar mobilization;Passive range of motion;Balance training;Therapeutic exercise   PT Next Visit Plan Review HEP, treatment goals, gentle ROM.    PT Home Exercise Plan Eval: Heel slides, quad sets, SAQ, bent knee raise, SLR, Abduction heel slide.    Consulted and Agree with Plan of Care Patient      Patient will benefit from skilled therapeutic intervention in order to improve the following deficits and impairments:  Abnormal gait, Decreased range of motion, Difficulty walking, Pain, Hypomobility, Decreased strength, Decreased mobility, Decreased activity tolerance, Impaired flexibility, Improper body mechanics  Visit Diagnosis: Acute pain of left knee - Plan: PT plan of care cert/re-cert  Difficulty in  walking, not elsewhere classified - Plan: PT plan of care cert/re-cert  Muscle weakness (generalized) - Plan: PT plan of care cert/re-cert       G-Codes - 07-21-2016 11/21/2135    Functional Assessment Tool Used Clinical Judgment based on objective examination findings.    Functional Limitation Mobility: Walking and moving around   Mobility: Walking and Moving Around Current Status [phone]) At least 20 percent but less than 40 percent impaired, limited or restricted   Mobility: Walking and Moving Around Goal Status [phone]) At least 1 percent but less than 20 percent impaired, limited or restricted      Problem List Patient Active Problem List   Diagnosis Date Noted  . Primary localized osteoarthritis of left knee   . Rectal bleeding 03/09/2013    9:41 PM, 07-21-2016 Etta Grandchild, PT, DPT Physical Therapist at [place] [phone] (office)     [place] [address] [place], [name], [address] Phone: [phone]   Fax:  [phone]  Name: Spencer [name] MRN: [id number] Date of Birth: 12/21/1945

## 2016-07-02 ENCOUNTER — Telehealth (HOSPITAL_COMMUNITY): Payer: Self-pay

## 2016-07-02 NOTE — Telephone Encounter (Signed)
07/02/16  Pt called to say that his entire groin area is purple and from his hamstring to posterior is black... His pain level is a 6 after 2 oxy.... He has been sitting on the torture cushion, has been walking up and down his house.... Should he be doing his exercises?  I told [place] and he suggested the patient call his surgeon.

## 2016-07-07 ENCOUNTER — Ambulatory Visit (HOSPITAL_COMMUNITY): Payer: Medicare Other | Attending: Orthopedic Surgery

## 2016-07-07 DIAGNOSIS — M25562 Pain in left knee: Secondary | ICD-10-CM | POA: Diagnosis present

## 2016-07-07 DIAGNOSIS — M6281 Muscle weakness (generalized): Secondary | ICD-10-CM | POA: Diagnosis present

## 2016-07-07 DIAGNOSIS — R262 Difficulty in walking, not elsewhere classified: Secondary | ICD-10-CM | POA: Insufficient documentation

## 2016-07-07 NOTE — Therapy (Signed)
[place], [name], [address] Phone: [phone]   Fax:  [phone]  Physical Therapy Treatment  Patient Details  Name: Spencer [name] MRN: [id number] Date of Birth: 08/10/1945 Referring Provider: [name]   Encounter Date: 07/07/2016      PT End of Session - 07/07/16 0929    Visit Number 2   Number of Visits 18   Date for PT Re-Evaluation 07/31/16   Authorization Type UHC medicare    Authorization Time Period 07/01/16-08/31/16   Authorization - Visit Number 2   Authorization - Number of Visits 10   PT Start Time 0910   PT Stop Time 0945   PT Time Calculation (min) 35 min   Activity Tolerance Patient tolerated treatment well;No increased pain   Behavior During Therapy WFL for tasks assessed/performed      Past Medical History:  Diagnosis Date  . 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 Fracture    ribs/ right wrist x3/ left ankle/cracked vertebrae-cervical/  . GERD (gastroesophageal reflux disease)   . Medical history non-contributory   . Primary localized osteoarthritis of left knee     Past Surgical History:  Procedure Laterality Date  . BASAL CELL CARCINOMA EXCISION    . COLONOSCOPY  07/04/2004   RMR: Diminutive rectal polyps, removed with cold biopsy forceps, otherwise normal/  Minimal left-sided diverticula, the remainder of the colonic mucosa normal. Hyerplastic  . COLONOSCOPY N/A 03/27/2013   Dr.Rourk-prominent internal hemorrhoids o/w normal rectum. scattered sigmoid diverticula with associated areas of submucosal petechiae. two 64mm diminutive polypoid lesions opposite the ileocecal valve the remainder of the colonic mucosa appeared normal. bx= tubular adenoma, benign hyperemic colorectal mucosa with focal active colitis  . FOOT SURGERY Left    X 2-otif  . FOOT SURGERY Right 2003  . HEMORRHOID BANDING    . KNEE SURGERY Left 1973   open  . OLECRANON BURSECTOMY  Right 03/29/2014   Procedure: EXCISION OF RIGHT OLECRANON BURSA;  Surgeon: [name], MD;  Location: [facility];  Service: Orthopedics;  Laterality: Right;  . TOTAL KNEE ARTHROPLASTY Left 06/29/2016   Procedure: LEFT TOTAL KNEE ARTHROPLASTY;  Surgeon: [name], MD;  Location: [place];  Service: Orthopedics;  Laterality: Left;    There were no vitals filed for this visit.      Subjective Assessment - 07/07/16 0912    Subjective Pt stated compliance with HEP wihtout questions.  Current pain scale 3/10 achey knee pain.  Stated pain with CPM 6 hr/ day.   Pertinent History Multiple fractures from sports over the years. History of left knee surgery in 1974 for debridement.     Patient Stated Goals Return to hunting and playing golf.    Currently in Pain? Yes   Pain Score 3    Pain Location Knee   Pain Orientation Left   Pain Type Surgical pain;Acute pain   Aggravating Factors  certain exercises   Pain Relieving Factors walking   Effect of Pain on Daily Activities reduced                         [facility] Adult PT Treatment/Exercise - 07/07/16 0001      Knee/Hip Exercises: Supine   Quad Sets 10 reps;1 set  Tactile cueing for distal quad contraction, improved followi   Quad Sets Limitations Tactile cueing for distal quad contraction, improved following SAQ   Short Arc  Quad Sets Left;10 reps;AROM;Strengthening  Good distal quad contraction/education for proper activation   Heel Slides 10 reps;1 set;Left;AROM   Heel Slides Limitations 10-90 degrees   Straight Leg Raises 10 reps;Left;Strengthening;AAROM   Other Supine Knee/Hip Exercises lateral abduction heel slides: 1x15 (HEP)     Manual Therapy   Manual Therapy Edema management   Manual therapy comments Manual complete separate rest of tx   Edema Management Retro massage with LE elevated                PT Education - 07/07/16 0945    Education provided Yes   Education Details Reviewed goals, complaince and  assured correct technique with HEP, copy of eval given to pt.  Instructed retro massage to pt/wife.  Educated swelling management techniques   Person(s) Educated Patient;Spouse   Methods Explanation;Demonstration;Handout;Verbal cues   Comprehension Verbalized understanding;Returned demonstration          PT Short Term Goals - 07/01/16 2131      PT SHORT TERM GOAL #1   Title After 4 weeks patient will demonstrate improved Left knee ROM AEB Lt knee flexion 7-113 degrees to improve safety getting into bathtub.    Status New     PT SHORT TERM GOAL #2   Title After 4 weeks patient will demonstrate ability to tolerate 6 minutes sustained AMB with overall gait speed >1.61m/s to improve safety with household distance AMB.    Status New     PT SHORT TERM GOAL #3   Title After 4 weeks patient will demonstrate imrpoved strength in Lt LE AEB MMT4/5 or greater to restore to PLOF.    Status New           PT Long Term Goals - 07/01/16 2135      PT LONG TERM GOAL #1   Title After 8 weeks patient will demonstrate improved Left knee ROM AEB Lt knee flexion 4-121 degrees to improve safety getting into bathtub.    Status New     PT LONG TERM GOAL #2   Title After 8 weeks patient will demonstrate ability to tolerate 6 minutes sustained AMB with overall gait speed >1.3 m/s to improve ease of IADL and return to golfing.   Status New     PT LONG TERM GOAL #3   Title After 8 weeks patient will demonstrate imrpoved strength in Lt LE AEB MMT4+/5 or greater to restore to PLOF.    Status New               Plan - 07/07/16 [id number]    Clinical Impression Statement Reviewed goals, assured correct techniques with HEP and copy of eval given.  Pt required initially verbal and tactile cueing to improve quadricep contraction and decrease gluteal activation, able to complete correct quad set following cueing.  Session focus on improving ROM and education to pt and wife on swelling technqiues to assist with  ROM and reduce pain.  EOS improved ROM 10-90 degrees, pain free and improved gait mechanics.     Rehab Potential Good   PT Frequency 2x / week   PT Duration 8 weeks   PT Treatment/Interventions Therapeutic activities;Moist Heat;Electrical Stimulation;Cryotherapy;DME Instruction;Gait training;Stair training;Functional mobility training;Dry needling;Manual techniques;Scar mobilization;Passive range of motion;Balance training;Therapeutic exercise   PT Next Visit Plan Focus ROM and manual technqiues for edema control.  Add hamstring and gastroc stretches and knee drives next session.  Progress to strengthening when appropriate   PT Home Exercise Plan Eval: Heel slides, quad sets, SAQ,  bent knee raise, SLR, Abduction heel slide.       Patient will benefit from skilled therapeutic intervention in order to improve the following deficits and impairments:  Abnormal gait, Decreased range of motion, Difficulty walking, Pain, Hypomobility, Decreased strength, Decreased mobility, Decreased activity tolerance, Impaired flexibility, Improper body mechanics  Visit Diagnosis: Acute pain of left knee  Difficulty in walking, not elsewhere classified  Muscle weakness (generalized)     Problem List Patient Active Problem List   Diagnosis Date Noted  . Primary localized osteoarthritis of left knee   . Rectal bleeding 03/09/2013   Ihor Austin, [place]; [place]  Aldona Lento 07/07/2016, 10:07 AM  [place], [name], [address] Phone: [phone]   Fax:  [phone]  Name: CHARES PAVELKO MRN: [id number] Date of Birth: 1946/03/23

## 2016-07-08 ENCOUNTER — Ambulatory Visit (HOSPITAL_COMMUNITY): Payer: Medicare Other

## 2016-07-10 ENCOUNTER — Ambulatory Visit (HOSPITAL_COMMUNITY): Payer: Medicare Other

## 2016-07-10 DIAGNOSIS — R262 Difficulty in walking, not elsewhere classified: Secondary | ICD-10-CM

## 2016-07-10 DIAGNOSIS — M25562 Pain in left knee: Secondary | ICD-10-CM

## 2016-07-10 DIAGNOSIS — M6281 Muscle weakness (generalized): Secondary | ICD-10-CM

## 2016-07-10 NOTE — Therapy (Signed)
[place], [name], [address] Phone: [phone]   Fax:  [phone]  Physical Therapy Treatment  Patient Details  Name: Spencer [name] MRN: [id number] Date of Birth: May 21, 1946 Referring Provider: [name]   Encounter Date: 07/10/2016      PT End of Session - 07/10/16 1105    Visit Number 3   Number of Visits 18   Date for PT Re-Evaluation 07/31/16   Authorization Type UHC medicare    Authorization Time Period 07/01/16-08/31/16   Authorization - Visit Number 3   Authorization - Number of Visits 10   PT Start Time [id number]   PT Stop Time 1118   PT Time Calculation (min) 43 min   Equipment Utilized During Treatment Gait belt   Activity Tolerance Patient tolerated treatment well;No increased pain   Behavior During Therapy WFL for tasks assessed/performed      Past Medical History:  Diagnosis Date  . 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 Fracture    ribs/ right wrist x3/ left ankle/cracked vertebrae-cervical/  . GERD (gastroesophageal reflux disease)   . Medical history non-contributory   . Primary localized osteoarthritis of left knee     Past Surgical History:  Procedure Laterality Date  . BASAL CELL CARCINOMA EXCISION    . COLONOSCOPY  07/04/2004   RMR: Diminutive rectal polyps, removed with cold biopsy forceps, otherwise normal/  Minimal left-sided diverticula, the remainder of the colonic mucosa normal. Hyerplastic  . COLONOSCOPY N/A 03/27/2013   Dr.Rourk-prominent internal hemorrhoids o/w normal rectum. scattered sigmoid diverticula with associated areas of submucosal petechiae. two 92mm diminutive polypoid lesions opposite the ileocecal valve the remainder of the colonic mucosa appeared normal. bx= tubular adenoma, benign hyperemic colorectal mucosa with focal active colitis  . FOOT SURGERY Left    X 2-otif  . FOOT SURGERY Right 2003  . HEMORRHOID BANDING    . KNEE  SURGERY Left 1973   open  . OLECRANON BURSECTOMY Right 03/29/2014   Procedure: EXCISION OF RIGHT OLECRANON BURSA;  Surgeon: [name], MD;  Location: [facility];  Service: Orthopedics;  Laterality: Right;  . TOTAL KNEE ARTHROPLASTY Left 06/29/2016   Procedure: LEFT TOTAL KNEE ARTHROPLASTY;  Surgeon: [name], MD;  Location: [facility];  Service: Orthopedics;  Laterality: Left;    There were no vitals filed for this visit.      Subjective Assessment - 07/10/16 1040    Subjective Patient reports all is going well. HEP is getting easier. His pain is imporving, only intermittent. Still in CPM 6 hours up to 110*.    Pertinent History Multiple fractures from sports over the years. History of left knee surgery in 1974 for debridement.     Currently in Pain? No/denies                         [facility] Adult PT Treatment/Exercise - 07/10/16 0001      Ambulation/Gait   Ambulation/Gait Yes   Ambulation Distance (Feet) 450 Feet   Assistive device None   Gait Pattern Step-through pattern   Gait velocity 0.4m/s     Knee/Hip Exercises: Stretches   Quad Stretch Left;10 seconds  10x5sec lunge on 12"step   Hip Flexor Stretch --  Prone Rectus  knee flexion stretch: 3x30sec     Knee/Hip Exercises: Seated   Long Arc Quad Left;2 sets   Long Arc Quad Weight 4 lbs.  try  more next visit   Sit to Sand 2 sets;10 reps;without UE support  from 24" surface     Knee/Hip Exercises: Supine   Short Arc Quad Sets 3 sets;10 reps  1x10 c 0lb; 3x10 c 3lb lb   Bridges 2 sets;Both;Strengthening  knees at 70* for hamstrings biad   Straight Leg Raises 2 sets;15 reps;Left     Knee/Hip Exercises: Prone   Hamstring Curl 3 sets;10 reps   Hamstring Curl Limitations 4lb    Prone Knee Hang 3 minutes;Weights  3x60sec c 4lb                  PT Short Term Goals - 07/01/16 2131      PT SHORT TERM GOAL #1   Title After 4 weeks patient will demonstrate improved Left knee ROM AEB Lt knee  flexion 7-113 degrees to improve safety getting into bathtub.    Status New     PT SHORT TERM GOAL #2   Title After 4 weeks patient will demonstrate ability to tolerate 6 minutes sustained AMB with overall gait speed >1.22m/s to improve safety with household distance AMB.    Status New     PT SHORT TERM GOAL #3   Title After 4 weeks patient will demonstrate imrpoved strength in Lt LE AEB MMT4/5 or greater to restore to PLOF.    Status New           PT Long Term Goals - 07/01/16 2135      PT LONG TERM GOAL #1   Title After 8 weeks patient will demonstrate improved Left knee ROM AEB Lt knee flexion 4-121 degrees to improve safety getting into bathtub.    Status New     PT LONG TERM GOAL #2   Title After 8 weeks patient will demonstrate ability to tolerate 6 minutes sustained AMB with overall gait speed >1.3 m/s to improve ease of IADL and return to golfing.   Status New     PT LONG TERM GOAL #3   Title After 8 weeks patient will demonstrate imrpoved strength in Lt LE AEB MMT4+/5 or greater to restore to PLOF.    Status New               Plan - 07/10/16 1106    Clinical Impression Statement Session going well, exercises progressed today adding weight and sets to many exercises. Patient making excellent progress toward goals. His self selected gait speed today is twice as fast as his maximal gait speed last week. No changes to POC at this time. Mild continued pain with STS.    Rehab Potential Good   PT Frequency 2x / week   PT Duration 8 weeks   PT Treatment/Interventions Therapeutic activities;Moist Heat;Electrical Stimulation;Cryotherapy;DME Instruction;Gait training;Stair training;Functional mobility training;Dry needling;Manual techniques;Scar mobilization;Passive range of motion;Balance training;Therapeutic exercise   PT Next Visit Plan Consider adding in  more exercise for HEP. Focus ROM and manual technqiues for edema control.  Add hamstring and gastroc stretches and  knee drives next session.  Progress to strengthening when appropriate   PT Home Exercise Plan Eval: Heel slides, quad sets, SAQ, bent knee raise, SLR, Abduction heel slide.    Consulted and Agree with Plan of Care Patient      Patient will benefit from skilled therapeutic intervention in order to improve the following deficits and impairments:  Abnormal gait, Decreased range of motion, Difficulty walking, Pain, Hypomobility, Decreased strength, Decreased mobility, Decreased activity tolerance, Impaired flexibility, Improper body mechanics  Visit Diagnosis: Acute pain of left knee  Difficulty in walking, not elsewhere classified  Muscle weakness (generalized)     Problem List Patient Active Problem List   Diagnosis Date Noted  . Primary localized osteoarthritis of left knee   . Rectal bleeding 03/09/2013    11:21 AM, 07/10/16 Etta Grandchild, PT, DPT Physical Therapist at [facility] [phone] (office)     [facility] [address] [place], [name], [address] Phone: [phone]   Fax:  [phone]  Name: ZERIK HARBAUGH MRN: [id number] Date of Birth: Oct 14, 1945

## 2016-07-13 ENCOUNTER — Ambulatory Visit (HOSPITAL_COMMUNITY): Payer: Medicare Other | Admitting: Physical Therapy

## 2016-07-13 DIAGNOSIS — M6281 Muscle weakness (generalized): Secondary | ICD-10-CM

## 2016-07-13 DIAGNOSIS — M25562 Pain in left knee: Secondary | ICD-10-CM | POA: Diagnosis not present

## 2016-07-13 DIAGNOSIS — R262 Difficulty in walking, not elsewhere classified: Secondary | ICD-10-CM

## 2016-07-13 NOTE — Therapy (Signed)
[place], [name], [address] Phone: [phone]   Fax:  [phone]  Physical Therapy Treatment  Patient Details  Name: Spencer [name] MRN: [id number] Date of Birth: 09/08/1945 Referring Provider: [name]   Encounter Date: 07/13/2016      PT End of Session - 07/13/16 0943    Visit Number 4   Number of Visits 18   Date for PT Re-Evaluation 07/31/16   Authorization Type [employer] medicare    Authorization Time Period 07/01/16-08/31/16   Authorization - Visit Number 4   Authorization - Number of Visits 10   PT Start Time 0858   PT Stop Time 0942   PT Time Calculation (min) 44 min   Activity Tolerance Patient tolerated treatment well   Behavior During Therapy [facility] for tasks assessed/performed      Past Medical History:  Diagnosis Date  . 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 Fracture    ribs/ right wrist x3/ left ankle/cracked vertebrae-cervical/  . GERD (gastroesophageal reflux disease)   . Medical history non-contributory   . Primary localized osteoarthritis of left knee     Past Surgical History:  Procedure Laterality Date  . BASAL CELL CARCINOMA EXCISION    . COLONOSCOPY  07/04/2004   RMR: Diminutive rectal polyps, removed with cold biopsy forceps, otherwise normal/  Minimal left-sided diverticula, the remainder of the colonic mucosa normal. Hyerplastic  . COLONOSCOPY N/A 03/27/2013   Dr.Rourk-prominent internal hemorrhoids o/w normal rectum. scattered sigmoid diverticula with associated areas of submucosal petechiae. two 76mm diminutive polypoid lesions opposite the ileocecal valve the remainder of the colonic mucosa appeared normal. bx= tubular adenoma, benign hyperemic colorectal mucosa with focal active colitis  . FOOT SURGERY Left    X 2-otif  . FOOT SURGERY Right 2003  . HEMORRHOID BANDING    . KNEE SURGERY Left 1973   open  . OLECRANON BURSECTOMY Right 03/29/2014    Procedure: EXCISION OF RIGHT OLECRANON BURSA;  Surgeon: [name], MD;  [facility];  Service: Orthopedics;  Laterality: Right;  . TOTAL KNEE ARTHROPLASTY Left 06/29/2016   Procedure: LEFT TOTAL KNEE ARTHROPLASTY;  Surgeon: [name], MD;  Location: [facility];  Service: Orthopedics;  Laterality: Left;    There were no vitals filed for this visit.      Subjective Assessment - 07/13/16 0900    Subjective Patient arrives today stating he is feeling much stiffer after his PT Friday, it is still really bothering him. Still using CPM for 2 hours at at time.    Pertinent History Multiple fractures from sports over the years. History of left knee surgery in 1974 for debridement.     Patient Stated Goals Return to hunting and playing golf.    Currently in Pain? No/denies  just stiff             [facility] PT Assessment - 07/13/16 0001      PROM   Left Knee Extension 10  5 post edema massage    Left Knee Flexion 91  92 post edema massage                      [facility] Adult PT Treatment/Exercise - 07/13/16 0001      Knee/Hip Exercises: Stretches   Active Hamstring Stretch 3 reps;30 seconds;Left   Knee: Self-Stretch to increase Flexion Left;10 seconds   Knee: Self-Stretch Limitations 10 reps, 10 second holds  Gastroc Stretch Both;3 reps;30 seconds     Knee/Hip Exercises: Standing   Heel Raises Both;1 set;20 reps   Heel Raises Limitations toe and heel    Rocker Board 2 minutes   Rocker Board Limitations lateral, intermittent UEs      Knee/Hip Exercises: Supine   Quad Sets Both;Left;15 reps   Quad Sets Limitations 5 second holds    Heel Slides Left;15 reps   Heel Slides Limitations 5 second holds      Manual Therapy   Manual Therapy Edema management   Manual therapy comments Manual complete separate rest of tx   Edema Management Retro massage with LE elevated                PT Education - 07/13/16 [phone]    Education provided Yes   Education Details  need to focus on ROM over strength at this point in recovery especially due to knee stiffness post surgery, HEP udates, encouraged retro-massage at home    Person(s) Educated Patient;Spouse   Methods Explanation   Comprehension Verbalized understanding          PT Short Term Goals - 07/01/16 2131      PT SHORT TERM GOAL #1   Title After 4 weeks patient will demonstrate improved Left knee ROM AEB Lt knee flexion 7-113 degrees to improve safety getting into bathtub.    Status New     PT SHORT TERM GOAL #2   Title After 4 weeks patient will demonstrate ability to tolerate 6 minutes sustained AMB with overall gait speed >1.13m/s to improve safety with household distance AMB.    Status New     PT SHORT TERM GOAL #3   Title After 4 weeks patient will demonstrate imrpoved strength in Lt LE AEB MMT4/5 or greater to restore to PLOF.    Status New           PT Long Term Goals - 07/01/16 2135      PT LONG TERM GOAL #1   Title After 8 weeks patient will demonstrate improved Left knee ROM AEB Lt knee flexion 4-121 degrees to improve safety getting into bathtub.    Status New     PT LONG TERM GOAL #2   Title After 8 weeks patient will demonstrate ability to tolerate 6 minutes sustained AMB with overall gait speed >1.3 m/s to improve ease of IADL and return to golfing.   Status New     PT LONG TERM GOAL #3   Title After 8 weeks patient will demonstrate imrpoved strength in Lt LE AEB MMT4+/5 or greater to restore to PLOF.    Status New               Plan - 07/13/16 0943    Clinical Impression Statement Performed functional stretching today and assessed ROM today, at 10 to 91 degrees, due to patient concerns over increased knee stiffness after last session. Focused on ROM improving techniques and edema control activities today, avoiding weighted exercise today due to patient continuing to lack ROM. Will plan to re-incorporate weighted exercises when patient consistently reaches  satisfactory and consistent ROM. Updated HEP as appropriate this session.    Rehab Potential Good   PT Frequency 2x / week   PT Duration 8 weeks   PT Treatment/Interventions Therapeutic activities;Moist Heat;Electrical Stimulation;Cryotherapy;DME Instruction;Gait training;Stair training;Functional mobility training;Dry needling;Manual techniques;Scar mobilization;Passive range of motion;Balance training;Therapeutic exercise   PT Next Visit Plan Focus on ROM and edema control! Do not focus on  strength until ROM has reached satisfactory level. Gait training.    PT Home Exercise Plan Eval: Heel slides, quad sets, SAQ, bent knee raise, SLR, Abduction heel slide.; replaced SAQs with quad sets, added seated extension stretch and seated knee flexion  stretch    Consulted and Agree with Plan of Care Patient;Family member/caregiver   Family Member Consulted spouse       Patient will benefit from skilled therapeutic intervention in order to improve the following deficits and impairments:  Abnormal gait, Decreased range of motion, Difficulty walking, Pain, Hypomobility, Decreased strength, Decreased mobility, Decreased activity tolerance, Impaired flexibility, Improper body mechanics  Visit Diagnosis: Acute pain of left knee  Difficulty in walking, not elsewhere classified  Muscle weakness (generalized)     Problem List Patient Active Problem List   Diagnosis Date Noted  . Primary localized osteoarthritis of left knee   . Rectal bleeding 03/09/2013    Deniece Ree PT, DPT [place] [address] [place], [name], [address] Phone: [phone]   Fax:  [phone]  Name: Spencer [name] MRN: [id number] Date of Birth: 06-05-1946

## 2016-07-13 NOTE — Patient Instructions (Signed)
   QUAD SET  Tighten your top thigh muscle as you attempt to press the back of your knee downward towards the table.  Hold for 5 seconds and relax. Repeat 10-15 times, 3 times per day.     KNEE FLEXION STRETCH - SELF ASSISTED  While seated in a chair, use your unaffected leg to bend your affected knee until a stretch is felt.  Hold for 10 seconds and repeat 10 times, 3 times per day.    KNEE EXTENSION STRETCH - PROPPED  While seated, prop your foot up on another chair and allow gravity to stretch your knee towards a more straightened position.   Start by staying in this position for 3-5 minutes, twice a day. Add 1-2 minutes to the stretch every 3-4 days.

## 2016-07-15 ENCOUNTER — Ambulatory Visit (HOSPITAL_COMMUNITY): Payer: Medicare Other

## 2016-07-15 DIAGNOSIS — R262 Difficulty in walking, not elsewhere classified: Secondary | ICD-10-CM

## 2016-07-15 DIAGNOSIS — M6281 Muscle weakness (generalized): Secondary | ICD-10-CM

## 2016-07-15 DIAGNOSIS — M25562 Pain in left knee: Secondary | ICD-10-CM

## 2016-07-15 NOTE — Therapy (Signed)
[place], [name], [address] Phone: [phone]   Fax:  [phone]  Physical Therapy Treatment  Patient Details  Name: Spencer [name] MRN: [id number] Date of Birth: 10-25-1945 Referring Provider: [name]   Encounter Date: 07/15/2016      PT End of Session - 07/15/16 1006    Visit Number 5   Number of Visits 18   Date for PT Re-Evaluation 07/31/16   Authorization Type [employer] medicare    Authorization Time Period 07/01/16-08/31/16   Authorization - Visit Number 5   Authorization - Number of Visits 10   PT Start Time 0945   PT Stop Time 1025   PT Time Calculation (min) 40 min   Activity Tolerance Patient tolerated treatment well   Behavior During Therapy [facility] for tasks assessed/performed      Past Medical History:  Diagnosis Date  . 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 Fracture    ribs/ right wrist x3/ left ankle/cracked vertebrae-cervical/  . GERD (gastroesophageal reflux disease)   . Medical history non-contributory   . Primary localized osteoarthritis of left knee     Past Surgical History:  Procedure Laterality Date  . BASAL CELL CARCINOMA EXCISION    . COLONOSCOPY  07/04/2004   RMR: Diminutive rectal polyps, removed with cold biopsy forceps, otherwise normal/  Minimal left-sided diverticula, the remainder of the colonic mucosa normal. Hyerplastic  . COLONOSCOPY N/A 03/27/2013   Dr.Rourk-prominent internal hemorrhoids o/w normal rectum. scattered sigmoid diverticula with associated areas of submucosal petechiae. two 59mm diminutive polypoid lesions opposite the ileocecal valve the remainder of the colonic mucosa appeared normal. bx= tubular adenoma, benign hyperemic colorectal mucosa with focal active colitis  . FOOT SURGERY Left    X 2-otif  . FOOT SURGERY Right 2003  . HEMORRHOID BANDING    . KNEE SURGERY Left 1973   open  . OLECRANON BURSECTOMY Right 03/29/2014   Procedure: EXCISION OF RIGHT OLECRANON BURSA;  Surgeon: [name], MD;  Location: [facility];  Service: Orthopedics;  Laterality: Right;  . TOTAL KNEE ARTHROPLASTY Left 06/29/2016   Procedure: LEFT TOTAL KNEE ARTHROPLASTY;  Surgeon: [name], MD;  Location: [place];  Service: Orthopedics;  Laterality: Left;    There were no vitals filed for this visit.      Subjective Assessment - 07/15/16 0948    Subjective Pt doign well today. Reports he is done with CPM at home. His walking is improving and HEP is going well 3x daily. Still having trouble with sleeping in bed, and continues to sleep in the bed.    Pertinent History Multiple fractures from sports over the years. History of left knee surgery in 1974 for debridement.     Currently in Pain? No/denies                         [facility] Adult PT Treatment/Exercise - 07/15/16 0001      Knee/Hip Exercises: Stretches   Hip Flexor Stretch Left;30 seconds;3 reps  Prone Rectus Stretch c rope    Knee: Self-Stretch to increase Flexion Left;10 seconds  // bars, 12" step   Knee: Self-Stretch Limitations 10 reps, 10 second holds    Gastroc Stretch Both;30 seconds;2 reps     Knee/Hip Exercises: Standing   Heel Raises 2 sets;Both  2x25   Knee Flexion Left;3 sets;10 reps   Knee Flexion Limitations 2lb   SLS narrow  stance on foam   3x30sec   Other Standing Knee Exercises Left tandem stance 3x30sec     Knee/Hip Exercises: Seated   Long Arc Quad Left;10 reps;3 sets  5#   Long Arc Quad Weight 5 lbs.   Other Seated Knee/Hip Exercises Heel slides left, continuous 3 minutes:   Sit to Sand 2 sets;10 reps;without UE support  from 23" surface     Knee/Hip Exercises: Supine   Knee Extension Limitations 15 degrees AROM   Knee Flexion Limitations 93 degrees AAROM                  PT Short Term Goals - 07/01/16 2131      PT SHORT TERM GOAL #1   Title After 4 weeks patient will demonstrate improved Left knee ROM AEB Lt  knee flexion 7-113 degrees to improve safety getting into bathtub.    Status New     PT SHORT TERM GOAL #2   Title After 4 weeks patient will demonstrate ability to tolerate 6 minutes sustained AMB with overall gait speed >1.83m/s to improve safety with household distance AMB.    Status New     PT SHORT TERM GOAL #3   Title After 4 weeks patient will demonstrate imrpoved strength in Lt LE AEB MMT4/5 or greater to restore to PLOF.    Status New           PT Long Term Goals - 07/01/16 2135      PT LONG TERM GOAL #1   Title After 8 weeks patient will demonstrate improved Left knee ROM AEB Lt knee flexion 4-121 degrees to improve safety getting into bathtub.    Status New     PT LONG TERM GOAL #2   Title After 8 weeks patient will demonstrate ability to tolerate 6 minutes sustained AMB with overall gait speed >1.3 m/s to improve ease of IADL and return to golfing.   Status New     PT LONG TERM GOAL #3   Title After 8 weeks patient will demonstrate imrpoved strength in Lt LE AEB MMT4+/5 or greater to restore to PLOF.    Status New               Plan - 07/15/16 1009    Clinical Impression Statement Pt continues to respond well to fucntional and isolated strenthening, with mild improvements in ROM. No major fatigue or pain limitations in session. Added in more balance and proprioceptive activities this session as well. Patient making progress toward goals.    Rehab Potential Good   PT Frequency 2x / week   PT Duration 8 weeks   PT Treatment/Interventions Therapeutic activities;Moist Heat;Electrical Stimulation;Cryotherapy;DME Instruction;Gait training;Stair training;Functional mobility training;Dry needling;Manual techniques;Scar mobilization;Passive range of motion;Balance training;Therapeutic exercise   PT Next Visit Plan Continue with current program, address edema as needed.    PT Home Exercise Plan Eval: Heel slides, quad sets, SAQ, bent knee raise, SLR, Abduction heel  slide.; replaced SAQs with quad sets, added seated extension stretch and seated knee flexion  stretch    Consulted and Agree with Plan of Care Patient;Family member/caregiver   Family Member Consulted spouse       Patient will benefit from skilled therapeutic intervention in order to improve the following deficits and impairments:  Abnormal gait, Decreased range of motion, Difficulty walking, Pain, Hypomobility, Decreased strength, Decreased mobility, Decreased activity tolerance, Impaired flexibility, Improper body mechanics  Visit Diagnosis: Acute pain of left knee  Difficulty in walking, not elsewhere  classified  Muscle weakness (generalized)     Problem List Patient Active Problem List   Diagnosis Date Noted  . Primary localized osteoarthritis of left knee   . Rectal bleeding 03/09/2013    10:29 AM, 07/15/16 Etta Grandchild, PT, DPT Physical Therapist at [facility] [phone] (office)     [place] [address] [place], [name], [address] Phone: [phone]   Fax:  [phone]  Name: LARSEN COLLISTER MRN: [id number] Date of Birth: [birth year]

## 2016-07-17 ENCOUNTER — Ambulatory Visit (HOSPITAL_COMMUNITY): Payer: Medicare Other

## 2016-07-17 DIAGNOSIS — M25562 Pain in left knee: Secondary | ICD-10-CM | POA: Diagnosis not present

## 2016-07-17 DIAGNOSIS — R262 Difficulty in walking, not elsewhere classified: Secondary | ICD-10-CM

## 2016-07-17 DIAGNOSIS — M6281 Muscle weakness (generalized): Secondary | ICD-10-CM

## 2016-07-17 NOTE — Therapy (Addendum)
[place], [name], [address] Phone: [phone]   Fax:  [phone]  Physical Therapy Treatment  Patient Details  Name: Spencer [name] MRN: [id number] Date of Birth: 17-Dec-1945 Referring Provider: [name]   Encounter Date: 07/17/2016      PT End of Session - 07/17/16 0846    Visit Number 6   Number of Visits 18   Date for PT Re-Evaluation 07/31/16   Authorization Type [employer] medicare    Authorization Time Period 07/01/16-08/31/16   Authorization - Visit Number 6   Authorization - Number of Visits 10   PT Start Time 0816   PT Stop Time 0856   PT Time Calculation (min) 40 min   Equipment Utilized During Treatment Gait belt   Activity Tolerance Patient tolerated treatment well   Behavior During Therapy [facility] for tasks assessed/performed      Past Medical History:  Diagnosis Date  . 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 Fracture    ribs/ right wrist x3/ left ankle/cracked vertebrae-cervical/  . GERD (gastroesophageal reflux disease)   . Medical history non-contributory   . Primary localized osteoarthritis of left knee     Past Surgical History:  Procedure Laterality Date  . BASAL CELL CARCINOMA EXCISION    . COLONOSCOPY  07/04/2004   RMR: Diminutive rectal polyps, removed with cold biopsy forceps, otherwise normal/  Minimal left-sided diverticula, the remainder of the colonic mucosa normal. Hyerplastic  . COLONOSCOPY N/A 03/27/2013   Dr.Rourk-prominent internal hemorrhoids o/w normal rectum. scattered sigmoid diverticula with associated areas of submucosal petechiae. two 41mm diminutive polypoid lesions opposite the ileocecal valve the remainder of the colonic mucosa appeared normal. bx= tubular adenoma, benign hyperemic colorectal mucosa with focal active colitis  . FOOT SURGERY Left    X 2-otif  . FOOT SURGERY Right 2003  . HEMORRHOID BANDING    . KNEE SURGERY Left 1973   open  . OLECRANON BURSECTOMY Right 03/29/2014   Procedure: EXCISION OF RIGHT OLECRANON BURSA;  Surgeon: [name], MD;  Location: [facility];  Service: Orthopedics;  Laterality: Right;  . TOTAL KNEE ARTHROPLASTY Left 06/29/2016   Procedure: LEFT TOTAL KNEE ARTHROPLASTY;  Surgeon: [name], MD;  Location: [place];  Service: Orthopedics;  Laterality: Left;    There were no vitals filed for this visit.      Subjective Assessment - 07/17/16 0822    Subjective Pt reports he is doing alright. He felt good after last PT session, but then he tried to get up on his feet the next day, more standing. Pt reports he misunderstood that he was suposed to be doing more easy walking, and plans to do more walking later this weekend.     Pertinent History Multiple fractures from sports over the years. History of left knee surgery in 1974 for debridement.     Currently in Pain? No/denies  loosening up some. stiffness only.                          [place] Adult PT Treatment/Exercise - 07/17/16 0001      Knee/Hip Exercises: Stretches   Knee: Self-Stretch to increase Flexion Left;Other (comment)  // bars, 12" step   Knee: Self-Stretch Limitations 15 reps, 5 second holds    Gastroc Stretch Both;30 seconds;2 reps     Knee/Hip Exercises: Standing   Heel Raises 2 sets;Both  2x25  Knee Flexion Left;3 sets;10 reps   Knee Flexion Limitations 2lb     Knee/Hip Exercises: Seated   Long Arc Quad Left;10 reps;3 sets  5#   Long Arc Quad Weight 5 lbs.     Knee/Hip Exercises: Supine   Heel Slides Left;1 set;Other (comment)  continuous for 4 minutes ( alt quad sets)   Heel Slides Limitations 2" board with towel    Knee Extension Limitations 11 degrees   Knee Flexion Limitations 100 degrees      Narrow Stance Foam 90 seconds Narrow Stance Foam 90 seconds head turns Narrow Stance Foam 90 seconds trunk rotation Narrow Stance Foam 90 seconds wall rebounder volley ball  SemiTandem Stance:  3x30sec bilat            PT Short Term Goals - 07/01/16 2131      PT SHORT TERM GOAL #1   Title After 4 weeks patient will demonstrate improved Left knee ROM AEB Lt knee flexion 7-113 degrees to improve safety getting into bathtub.    Status New     PT SHORT TERM GOAL #2   Title After 4 weeks patient will demonstrate ability to tolerate 6 minutes sustained AMB with overall gait speed >1.16m/s to improve safety with household distance AMB.    Status New     PT SHORT TERM GOAL #3   Title After 4 weeks patient will demonstrate imrpoved strength in Lt LE AEB MMT4/5 or greater to restore to PLOF.    Status New           PT Long Term Goals - 07/01/16 2135      PT LONG TERM GOAL #1   Title After 8 weeks patient will demonstrate improved Left knee ROM AEB Lt knee flexion 4-121 degrees to improve safety getting into bathtub.    Status New     PT LONG TERM GOAL #2   Title After 8 weeks patient will demonstrate ability to tolerate 6 minutes sustained AMB with overall gait speed >1.3 m/s to improve ease of IADL and return to golfing.   Status New     PT LONG TERM GOAL #3   Title After 8 weeks patient will demonstrate imrpoved strength in Lt LE AEB MMT4+/5 or greater to restore to PLOF.    Status New               Plan - 07/17/16 0847    Clinical Impression Statement Pt doing well today. Reports his knee felt great after last session. Continued to focus on progressing strength, ROM, and balance. Pt deomnstrating improved balance thsi session , with additional perturbation than previosuly. No changes to POC at this time. Progress toward goals is evident.    Rehab Potential Good   PT Frequency 2x / week   PT Duration 8 weeks   PT Treatment/Interventions Therapeutic activities;Moist Heat;Electrical Stimulation;Cryotherapy;DME Instruction;Gait training;Stair training;Functional mobility training;Dry needling;Manual techniques;Scar mobilization;Passive range of motion;Balance  training;Therapeutic exercise   PT Next Visit Plan Continue with current program, address edema as needed, no more than 10 minutes per session.    PT Home Exercise Plan Eval: Heel slides, quad sets, SAQ, bent knee raise, SLR, Abduction heel slide.; replaced SAQs with quad sets, added seated extension stretch and seated knee flexion  stretch    Consulted and Agree with Plan of Care Patient;Family member/caregiver   Family Member Consulted spouse       Patient will benefit from skilled therapeutic intervention in order to improve the following deficits and  impairments:  Abnormal gait, Decreased range of motion, Difficulty walking, Pain, Hypomobility, Decreased strength, Decreased mobility, Decreased activity tolerance, Impaired flexibility, Improper body mechanics  Visit Diagnosis: Acute pain of left knee  Difficulty in walking, not elsewhere classified  Muscle weakness (generalized)     Problem List Patient Active Problem List   Diagnosis Date Noted  . Primary localized osteoarthritis of left knee   . Rectal bleeding 03/09/2013    9:27 AM, 07/17/16 Etta Grandchild, PT, DPT Physical Therapist at [facility] [phone] (office)     [place] [address] [place], [name], [address] Phone: [phone]   Fax:  [phone]  Name: Spencer [name] MRN: [id number] Date of Birth: Mar 11, 1946

## 2016-07-21 ENCOUNTER — Ambulatory Visit (HOSPITAL_COMMUNITY): Payer: Medicare Other

## 2016-07-21 DIAGNOSIS — M6281 Muscle weakness (generalized): Secondary | ICD-10-CM

## 2016-07-21 DIAGNOSIS — R262 Difficulty in walking, not elsewhere classified: Secondary | ICD-10-CM

## 2016-07-21 DIAGNOSIS — M25562 Pain in left knee: Secondary | ICD-10-CM | POA: Diagnosis not present

## 2016-07-21 NOTE — Therapy (Signed)
[place], [name], [address] Phone: [phone]   Fax:  [phone]  Physical Therapy Treatment  Patient Details  Name: Spencer [name] MRN: [id number] Date of Birth: 1946-01-31 Referring Provider: [name]   Encounter Date: 08-06-2016      PT End of Session - Aug 06, 2016 0933    Visit Number 7   Number of Visits 18   Date for PT Re-Evaluation 07/31/16   Authorization Type [employer] medicare- G-codes done on Aug 06, 2016   Authorization Time Period 07/01/16-08/31/16   Authorization - Visit Number 7   Authorization - Number of Visits 19   PT Start Time 0902   PT Stop Time 0942   PT Time Calculation (min) 40 min   Activity Tolerance Patient tolerated treatment well   Behavior During Therapy [facility] for tasks assessed/performed      Past Medical History:  Diagnosis Date  . 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 Fracture    ribs/ right wrist x3/ left ankle/cracked vertebrae-cervical/  . GERD (gastroesophageal reflux disease)   . Medical history non-contributory   . Primary localized osteoarthritis of left knee     Past Surgical History:  Procedure Laterality Date  . BASAL CELL CARCINOMA EXCISION    . COLONOSCOPY  07/04/2004   RMR: Diminutive rectal polyps, removed with cold biopsy forceps, otherwise normal/  Minimal left-sided diverticula, the remainder of the colonic mucosa normal. Hyerplastic  . COLONOSCOPY N/A 03/27/2013   Dr.Rourk-prominent internal hemorrhoids o/w normal rectum. scattered sigmoid diverticula with associated areas of submucosal petechiae. two 51mm diminutive polypoid lesions opposite the ileocecal valve the remainder of the colonic mucosa appeared normal. bx= tubular adenoma, benign hyperemic colorectal mucosa with focal active colitis  . FOOT SURGERY Left    X 2-otif  . FOOT SURGERY Right 2003  . HEMORRHOID BANDING    . KNEE SURGERY Left 1973   open  . OLECRANON  BURSECTOMY Right 03/29/2014   Procedure: EXCISION OF RIGHT OLECRANON BURSA;  Surgeon: [name], MD;  Location: [facility];  Service: Orthopedics;  Laterality: Right;  . TOTAL KNEE ARTHROPLASTY Left 06/29/2016   Procedure: LEFT TOTAL KNEE ARTHROPLASTY;  Surgeon: [name], MD;  Location: [facility];  Service: Orthopedics;  Laterality: Left;    There were no vitals filed for this visit.      Subjective Assessment - 06-Aug-2016 0912    Subjective Pt doign well, has been walking more in public, left leg gets tired, but pain adn swelling remain stable thereafter.    Pertinent History Multiple fractures from sports over the years. History of left knee surgery in 1974 for debridement.     Currently in Pain? No/denies                         [facility] Adult PT Treatment/Exercise - Aug 06, 2016 0001      Ambulation/Gait   Ambulation/Gait Yes   Gait velocity 1.02 m/s     Knee/Hip Exercises: Stretches   Active Hamstring Stretch 3 reps;30 seconds;Left   Hip Flexor Stretch Left;30 seconds;4 reps  Prone Rectus Stretch c rope;  ART to quads between sets    Knee: Self-Stretch to increase Flexion Left;10 seconds  // bars, 12" step   Knee: Self-Stretch Limitations 10 reps, 10 second holds    Press photographer Both;30 seconds;2 reps     Knee/Hip Exercises: Seated   Long Arc Quad Left;2 sets;10 reps  Long Arc [employer] --  7lbs   Sit to [employer] 2 sets;10 reps;without UE support  from 21" surface     Knee/Hip Exercises: Supine   Quad Sets 10 reps;3 sets;Left  VC to avoid glut emax dominance   Short Arc [employer] 10 reps;Left;2 sets   Knee Extension Limitations 7 degrees   Knee Flexion Limitations 107 degrees     Knee/Hip Exercises: Prone   Hamstring Curl 10 reps;2 sets   Hamstring Curl Limitations 4lb    Prone Knee Hang Weights;2 minutes  2x60sec c 4lb     Manual Therapy   Manual Therapy Soft tissue mobilization;Joint mobilization   Joint Mobilization Supine: Lateral Rotation                   PT Short Term Goals - 07/21/16 0942      PT SHORT TERM GOAL #1   Title After 4 weeks patient will demonstrate improved Left knee ROM AEB Lt knee flexion 7-113 degrees to improve safety getting into bathtub.    Baseline On 07/21/16, 7-107 degrees left knee flexion.    Status On-going     PT SHORT TERM GOAL #2   Title After 4 weeks patient will demonstrate ability to tolerate 6 minutes sustained AMB with overall gait speed >1.35m/s to improve safety with household distance AMB.    Baseline 6MWT averaging 1.7m/s, pain stable.    Status Achieved     PT SHORT TERM GOAL #3   Title After 4 weeks patient will demonstrate imrpoved strength in Lt LE AEB MMT4/5 or greater to restore to PLOF.    Status Achieved           PT Long Term Goals - 07/01/16 2135      PT LONG TERM GOAL #1   Title After 8 weeks patient will demonstrate improved Left knee ROM AEB Lt knee flexion 4-121 degrees to improve safety getting into bathtub.    Status New     PT LONG TERM GOAL #2   Title After 8 weeks patient will demonstrate ability to tolerate 6 minutes sustained AMB with overall gait speed >1.3 m/s to improve ease of IADL and return to golfing.   Status New     PT LONG TERM GOAL #3   Title After 8 weeks patient will demonstrate imrpoved strength in Lt LE AEB MMT4+/5 or greater to restore to PLOF.    Status New               Plan - 07/21/16 0933    Clinical Impression Statement Pt progressing well toward short term goals. His reassessment is coming up in 10 days, and he has already made his goals from 6MWT, and is making progress toward range goal, now at 7-107 degrees Lt knee flexion. All weights progressed this session with good response. Seat surface lowered for STS today as well. No changes to POC at this time.    Rehab Potential Good   PT Frequency 2x / week   PT Duration 8 weeks   PT Treatment/Interventions Therapeutic activities;Moist Heat;Electrical  Stimulation;Cryotherapy;DME Instruction;Gait training;Stair training;Functional mobility training;Dry needling;Manual techniques;Scar mobilization;Passive range of motion;Balance training;Therapeutic exercise   PT Next Visit Plan Continue with current program, cont to progress strength as appropriate, integrate high level balances, address edema as needed, no more than 10 minutes per session.    PT Home Exercise Plan Eval: Heel slides, quad sets, SAQ, bent knee raise, SLR, Abduction heel slide.; replaced SAQs with quad sets,  added seated extension stretch and seated knee flexion  stretch    Consulted and Agree with Plan of Care Patient;Family member/caregiver   Family Member Consulted spouse       Patient will benefit from skilled therapeutic intervention in order to improve the following deficits and impairments:  Abnormal gait, Decreased range of motion, Difficulty walking, Pain, Hypomobility, Decreased strength, Decreased mobility, Decreased activity tolerance, Impaired flexibility, Improper body mechanics  Visit Diagnosis: Acute pain of left knee  Difficulty in walking, not elsewhere classified  Muscle weakness (generalized)       G-Codes - 08-19-16 0948    Functional Assessment Tool Used Clinical Judgment based on objective examination findings.    Functional Limitation Mobility: Walking and moving around   Mobility: Walking and Moving Around Current Status [phone]) At least 1 percent but less than 20 percent impaired, limited or restricted   Mobility: Walking and Moving Around Goal Status [phone]) At least 1 percent but less than 20 percent impaired, limited or restricted      Problem List Patient Active Problem List   Diagnosis Date Noted  . Primary localized osteoarthritis of left knee   . Rectal bleeding 03/09/2013    9:53 AM, Aug 19, 2016 Etta Grandchild, PT, DPT Physical Therapist at [facility] [phone] (office)     [facility] [address] [place], [name], [address] Phone: [phone]   Fax:  [phone]  Name: Spencer [name] MRN: [id number] Date of Birth: [birth year]

## 2016-07-23 ENCOUNTER — Ambulatory Visit (HOSPITAL_COMMUNITY): Payer: Medicare Other

## 2016-07-23 DIAGNOSIS — M6281 Muscle weakness (generalized): Secondary | ICD-10-CM

## 2016-07-23 DIAGNOSIS — R262 Difficulty in walking, not elsewhere classified: Secondary | ICD-10-CM

## 2016-07-23 DIAGNOSIS — M25562 Pain in left knee: Secondary | ICD-10-CM | POA: Diagnosis not present

## 2016-07-23 NOTE — Patient Instructions (Signed)
Hamstring Step 3    Left leg in maximal straight leg raise, heel at maximal stretch, straighten knee further by tightening knee cap. Warning: Intense stretch. Stay within tolerance. Hold 30 seconds. Relax knee cap only. Repeat 3 times.  Copyright  VHI. All rights reserved.   Calf Stretch    Place hands on wall at shoulder height. Keeping back leg straight, bend front leg, feet pointing forward, heels flat on floor. Lean forward slightly until stretch is felt in calf of back leg. Hold stretch 30 seconds, breathing slowly in and out. Repeat stretch with other leg back. Do 3 sessions per day. Variation: Use chair or table for support.  Copyright  VHI. All rights reserved.

## 2016-07-23 NOTE — Therapy (Signed)
[place], [name], [address] Phone: [phone]   Fax:  [phone]  Physical Therapy Treatment  Patient Details  Name: Spencer [name] MRN: [id number] Date of Birth: 08-Dec-1945 Referring Provider: [name]   Encounter Date: 07/23/2016      PT End of Session - 07/23/16 0924    Visit Number 8   Number of Visits 18   Date for PT Re-Evaluation 07/31/16   Authorization Type [employer] medicare- G-codes done on Aug 14, 2016   Authorization Time Period 07/01/16-08/31/16   Authorization - Visit Number 8   Authorization - Number of Visits 19   PT Start Time [id number]   PT Stop Time 0946   PT Time Calculation (min) 48 min   Activity Tolerance Patient tolerated treatment well   Behavior During Therapy [facility] for tasks assessed/performed      Past Medical History:  Diagnosis Date  . 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 Fracture    ribs/ right wrist x3/ left ankle/cracked vertebrae-cervical/  . GERD (gastroesophageal reflux disease)   . Medical history non-contributory   . Primary localized osteoarthritis of left knee     Past Surgical History:  Procedure Laterality Date  . BASAL CELL CARCINOMA EXCISION    . COLONOSCOPY  07/04/2004   RMR: Diminutive rectal polyps, removed with cold biopsy forceps, otherwise normal/  Minimal left-sided diverticula, the remainder of the colonic mucosa normal. Hyerplastic  . COLONOSCOPY N/A 03/27/2013   Dr.Rourk-prominent internal hemorrhoids o/w normal rectum. scattered sigmoid diverticula with associated areas of submucosal petechiae. two 11mm diminutive polypoid lesions opposite the ileocecal valve the remainder of the colonic mucosa appeared normal. bx= tubular adenoma, benign hyperemic colorectal mucosa with focal active colitis  . FOOT SURGERY Left    X 2-otif  . FOOT SURGERY Right 2003  . HEMORRHOID BANDING    . KNEE SURGERY Left 1973   open  . OLECRANON  BURSECTOMY Right 03/29/2014   Procedure: EXCISION OF RIGHT OLECRANON BURSA;  Surgeon: [name], MD;  Location: [facility];  Service: Orthopedics;  Laterality: Right;  . TOTAL KNEE ARTHROPLASTY Left 06/29/2016   Procedure: LEFT TOTAL KNEE ARTHROPLASTY;  Surgeon: [name], MD;  Location: [facility];  Service: Orthopedics;  Laterality: Left;    There were no vitals filed for this visit.      Subjective Assessment - 07/23/16 0922    Subjective Pt stated knee is feeling good today, no reports of pain just tightness and swelling today.     Pertinent History Multiple fractures from sports over the years. History of left knee surgery in 1974 for debridement.     Patient Stated Goals Return to hunting and playing golf.    Currently in Pain? No/denies         AROM following manual: 4-114 degrees                 [facility] Adult PT Treatment/Exercise - 07/23/16 0001      Knee/Hip Exercises: Stretches   Active Hamstring Stretch 3 reps;30 seconds;Left   Active Hamstring Stretch Limitations supine with rope   Quad Stretch 3 reps;30 seconds   Knee: Self-Stretch to increase Flexion Left;10 seconds   Knee: Self-Stretch Limitations 10 reps, 10 second holds 12in step   Gastroc Stretch Left;1 rep;30 seconds   Gastroc Stretch Limitations against wall- HEP given   Other Knee/Hip Stretches slant board 2x 30"     Knee/Hip Exercises: Aerobic  Stationary Bike Seat 20 initial session to reduce tightness 5'     Knee/Hip Exercises: Supine   Quad Sets 10 reps;3 sets;Left   Quad Sets Limitations cueing to relax gluteal    Short Arc Quad Sets 15 reps   Patellar Mobs complete   Knee Extension Limitations 4 degrees   Knee Flexion Limitations 114 degrees     Manual Therapy   Manual Therapy Joint mobilization;Edema management   Manual therapy comments Manual complete separate rest of tx   Edema Management Retro massage with LE elevated   Joint Mobilization Patella mobs all directions; tib/fib                   PT Short Term Goals - 07/21/16 [id number]      PT SHORT TERM GOAL #1   Title After 4 weeks patient will demonstrate improved Left knee ROM AEB Lt knee flexion 7-113 degrees to improve safety getting into bathtub.    Baseline On 07/21/16, 7-107 degrees left knee flexion.    Status On-going     PT SHORT TERM GOAL #2   Title After 4 weeks patient will demonstrate ability to tolerate 6 minutes sustained AMB with overall gait speed >1.53m/s to improve safety with household distance AMB.    Baseline 6MWT averaging 1.80m/s, pain stable.    Status Achieved     PT SHORT TERM GOAL #3   Title After 4 weeks patient will demonstrate imrpoved strength in Lt LE AEB MMT4/5 or greater to restore to PLOF.    Status Achieved           PT Long Term Goals - 07/01/16 2135      PT LONG TERM GOAL #1   Title After 8 weeks patient will demonstrate improved Left knee ROM AEB Lt knee flexion 4-121 degrees to improve safety getting into bathtub.    Status New     PT LONG TERM GOAL #2   Title After 8 weeks patient will demonstrate ability to tolerate 6 minutes sustained AMB with overall gait speed >1.3 m/s to improve ease of IADL and return to golfing.   Status New     PT LONG TERM GOAL #3   Title After 8 weeks patient will demonstrate imrpoved strength in Lt LE AEB MMT4+/5 or greater to restore to PLOF.    Status New               Plan - 07/23/16 1003    Clinical Impression Statement Session focus on improving knee ROM.  Began session on bicycle to reduce tightness, initial rocking then able to complete full revolution.  Manual retro massage complete prior ROM therex for edema control to assist with range.  Noted tightness medial gastroc and hamstring musculature.  Pt educated on benefits of stretches and given HEP to begin gastroc and hamstring stretches at home.  Improved AROM at EOS 4-114 degrees.   Rehab Potential Good   PT Frequency 2x / week   PT Duration 8 weeks   PT  Treatment/Interventions Therapeutic activities;Moist Heat;Electrical Stimulation;Cryotherapy;DME Instruction;Gait training;Stair training;Functional mobility training;Dry needling;Manual techniques;Scar mobilization;Passive range of motion;Balance training;Therapeutic exercise   PT Next Visit Plan Continue with current program, cont to progress strength as appropriate, integrate high level balances, address edema as needed, no more than 10 minutes per session.    PT Home Exercise Plan Eval: Heel slides, quad sets, SAQ, bent knee raise, SLR, Abduction heel slide.; replaced SAQs with quad sets, added seated extension stretch and seated knee  flexion  stretch    Consulted and Agree with Plan of Care Patient;Family member/caregiver      Patient will benefit from skilled therapeutic intervention in order to improve the following deficits and impairments:  Abnormal gait, Decreased range of motion, Difficulty walking, Pain, Hypomobility, Decreased strength, Decreased mobility, Decreased activity tolerance, Impaired flexibility, Improper body mechanics  Visit Diagnosis: Acute pain of left knee  Difficulty in walking, not elsewhere classified  Muscle weakness (generalized)     Problem List Patient Active Problem List   Diagnosis Date Noted  . Primary localized osteoarthritis of left knee   . Rectal bleeding 03/09/2013   Ihor [place], [place]; [place]  Aldona Lento 07/23/2016, 12:57 PM  [facility] [address] [place], [name], [address] Phone: [phone]   Fax:  [phone]  Name: Spencer [name] MRN: [id number] Date of Birth: 1946-05-18

## 2016-07-30 ENCOUNTER — Ambulatory Visit (HOSPITAL_COMMUNITY): Payer: Medicare Other

## 2016-07-30 DIAGNOSIS — M25562 Pain in left knee: Secondary | ICD-10-CM | POA: Diagnosis not present

## 2016-07-30 DIAGNOSIS — R262 Difficulty in walking, not elsewhere classified: Secondary | ICD-10-CM

## 2016-07-30 DIAGNOSIS — M6281 Muscle weakness (generalized): Secondary | ICD-10-CM

## 2016-07-30 NOTE — Therapy (Signed)
[place], [name], [address] Phone: [phone]   Fax:  [phone]  Physical Therapy Treatment  Patient Details  Name: Spencer [name] MRN: [id number] Date of Birth: 11-28-1945 Referring Provider: [name]   Encounter Date: 07/30/2016      PT End of Session - 07/30/16 0949    Visit Number 9   Number of Visits 16   Date for PT Re-Evaluation 07/31/16   Authorization Type [employer] medicare- G-codes done on 07-26-16   Authorization Time Period 07/01/16-08/31/16   Authorization - Visit Number 9   Authorization - Number of Visits 10   PT Start Time [phone]   PT Stop Time 1030   PT Time Calculation (min) 48 min   Activity Tolerance Patient tolerated treatment well   Behavior During Therapy [facility] for tasks assessed/performed      Past Medical History:  Diagnosis Date  . 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 Fracture    ribs/ right wrist x3/ left ankle/cracked vertebrae-cervical/  . GERD (gastroesophageal reflux disease)   . Medical history non-contributory   . Primary localized osteoarthritis of left knee     Past Surgical History:  Procedure Laterality Date  . BASAL CELL CARCINOMA EXCISION    . COLONOSCOPY  07/04/2004   RMR: Diminutive rectal polyps, removed with cold biopsy forceps, otherwise normal/  Minimal left-sided diverticula, the remainder of the colonic mucosa normal. Hyerplastic  . COLONOSCOPY N/A 03/27/2013   Dr.Rourk-prominent internal hemorrhoids o/w normal rectum. scattered sigmoid diverticula with associated areas of submucosal petechiae. two 56mm diminutive polypoid lesions opposite the ileocecal valve the remainder of the colonic mucosa appeared normal. bx= tubular adenoma, benign hyperemic colorectal mucosa with focal active colitis  . FOOT SURGERY Left    X 2-otif  . FOOT SURGERY Right 2003  . HEMORRHOID BANDING    . KNEE SURGERY Left 1973   open  . OLECRANON  BURSECTOMY Right 03/29/2014   Procedure: EXCISION OF RIGHT OLECRANON BURSA;  Surgeon: [name], MD;  Location: [facility];  Service: Orthopedics;  Laterality: Right;  . TOTAL KNEE ARTHROPLASTY Left 06/29/2016   Procedure: LEFT TOTAL KNEE ARTHROPLASTY;  Surgeon: [name], MD;  Location: [facility];  Service: Orthopedics;  Laterality: Left;    There were no vitals filed for this visit.      Subjective Assessment - 07/30/16 0946    Subjective Pt stated knee is feelinig good today, minimal pain mainly stiffness today.  Reports of abilty to sleep for 6 hours last night following surgery.     Pertinent History Multiple fractures from sports over the years. History of left knee surgery in 1974 for debridement.     Patient Stated Goals Return to hunting and playing golf.    Currently in Pain? Yes   Pain Score 1    Pain Location Knee   Pain Orientation Left   Pain Descriptors / Indicators Tightness  Stiffness   Pain Type Surgical pain;Acute pain   Aggravating Factors  certain exercises   Pain Relieving Factors walking   Effect of Pain on Daily Activities reduced      AROM 4-112 degrees today       [facility] Adult PT Treatment/Exercise - 07/30/16 0001      Knee/Hip Exercises: Stretches   Active Hamstring Stretch 3 reps;30 seconds;Left   Active Hamstring Stretch Limitations supine with rope   Quad Stretch 3 reps;30 seconds   Quad Stretch Limitations  prone wiht rope   Knee: Self-Stretch to increase Flexion Left;10 seconds   Knee: Self-Stretch Limitations 10 reps, 10 second holds 12in step     Knee/Hip Exercises: Aerobic   Stationary Bike Seat 20 initial session to reduce tightness 5'     Knee/Hip Exercises: Standing   Heel Raises Both;20 reps   Terminal Knee Extension Limitations RTB 15x 5"   Wall Squat 10 reps;5 seconds     Knee/Hip Exercises: Supine   Quad Sets 10 reps;3 sets;Left   Quad Sets Limitations Cueing to improve contraction   Short Arc Quad Sets 20 reps   Heel Slides  10 reps   Terminal Knee Extension Left;10 reps   Terminal Knee Extension Limitations Cueing for    Straight Leg Raises 10 reps   Straight Leg Raises Limitations cueing for distal quad contraciton prior SLR     Knee/Hip Exercises: Prone   Hip Extension Limitations TKE 10x 5"                  PT Short Term Goals - 07/21/16 [id number]      PT SHORT TERM GOAL #1   Title After 4 weeks patient will demonstrate improved Left knee ROM AEB Lt knee flexion 7-113 degrees to improve safety getting into bathtub.    Baseline On 07/21/16, 7-107 degrees left knee flexion.    Status On-going     PT SHORT TERM GOAL #2   Title After 4 weeks patient will demonstrate ability to tolerate 6 minutes sustained AMB with overall gait speed >1.37m/s to improve safety with household distance AMB.    Baseline 6MWT averaging 1.41m/s, pain stable.    Status Achieved     PT SHORT TERM GOAL #3   Title After 4 weeks patient will demonstrate imrpoved strength in Lt LE AEB MMT4/5 or greater to restore to PLOF.    Status Achieved           PT Long Term Goals - 07/01/16 2135      PT LONG TERM GOAL #1   Title After 8 weeks patient will demonstrate improved Left knee ROM AEB Lt knee flexion 4-121 degrees to improve safety getting into bathtub.    Status New     PT LONG TERM GOAL #2   Title After 8 weeks patient will demonstrate ability to tolerate 6 minutes sustained AMB with overall gait speed >1.3 m/s to improve ease of IADL and return to golfing.   Status New     PT LONG TERM GOAL #3   Title After 8 weeks patient will demonstrate imrpoved strength in Lt LE AEB MMT4+/5 or greater to restore to PLOF.    Status New               Plan - 07/30/16 1018    Clinical Impression Statement Continues session focus on improving ROM.Marland Kitchen  Began session on bicycle to address knee mobiilty with abilty to complete full revoluatin initially.  Added TKE in multiple positions, verbal and tactile cueing to improve quad  contraction in supine and relax gluteal musculature.  Added wall slides for gluteal strengthening and to improve flexion.  No reoprts of increased pain through session.   Rehab Potential Good   PT Frequency 2x / week   PT Duration 8 weeks   PT Treatment/Interventions Therapeutic activities;Moist Heat;Electrical Stimulation;Cryotherapy;DME Instruction;Gait training;Stair training;Functional mobility training;Dry needling;Manual techniques;Scar mobilization;Passive range of motion;Balance training;Therapeutic exercise   PT Next Visit Plan Continue with current program, cont to progress strength as  appropriate, integrate high level balances, address edema as needed, no more than 10 minutes per session.    PT Home Exercise Plan Eval: Heel slides, quad sets, SAQ, bent knee raise, SLR, Abduction heel slide.; replaced SAQs with quad sets, added seated extension stretch and seated knee flexion  stretch; 12/28 standing TKE      Patient will benefit from skilled therapeutic intervention in order to improve the following deficits and impairments:  Abnormal gait, Decreased range of motion, Difficulty walking, Pain, Hypomobility, Decreased strength, Decreased mobility, Decreased activity tolerance, Impaired flexibility, Improper body mechanics  Visit Diagnosis: Acute pain of left knee  Difficulty in walking, not elsewhere classified  Muscle weakness (generalized)     Problem List Patient Active Problem List   Diagnosis Date Noted  . Primary localized osteoarthritis of left knee   . Rectal bleeding 03/09/2013    Aldona Lento 07/30/2016, 2:43 PM  [facility] [address] [place], [name], [address] Phone: [phone]   Fax:  [phone]  Name: TRAVUS SACCHETTI MRN: [id number] Date of Birth: 04/28/1946

## 2016-08-04 ENCOUNTER — Ambulatory Visit (HOSPITAL_COMMUNITY): Payer: Medicare Other | Attending: Orthopedic Surgery | Admitting: Physical Therapy

## 2016-08-04 DIAGNOSIS — M25562 Pain in left knee: Secondary | ICD-10-CM | POA: Insufficient documentation

## 2016-08-04 DIAGNOSIS — M6281 Muscle weakness (generalized): Secondary | ICD-10-CM

## 2016-08-04 DIAGNOSIS — R262 Difficulty in walking, not elsewhere classified: Secondary | ICD-10-CM | POA: Diagnosis present

## 2016-08-04 NOTE — Therapy (Signed)
[place] [address] [place], [name], [address] Phone: [phone]   Fax:  [phone]  Physical Therapy Treatment (Re-assessment) Patient Details  Name: Spencer [name] MRN: [id number] Date of Birth: 1946-05-15 Referring Provider: [name]   Encounter Date: 08/04/2016      PT End of Session - 08/04/16 0944    Visit Number 10   Number of Visits 18   Date for PT Re-Evaluation 08/31/16   Authorization Type [employer] medicare- G-codes done on 2016-07-24   Authorization Time Period 07/01/16-08/31/16   PT Start Time 0900   PT Stop Time 0942   PT Time Calculation (min) 42 min   Activity Tolerance Patient tolerated treatment well   Behavior During Therapy [facility] for tasks assessed/performed      Past Medical History:  Diagnosis Date  . 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 Fracture    ribs/ right wrist x3/ left ankle/cracked vertebrae-cervical/  . GERD (gastroesophageal reflux disease)   . Medical history non-contributory   . Primary localized osteoarthritis of left knee     Past Surgical History:  Procedure Laterality Date  . BASAL CELL CARCINOMA EXCISION    . COLONOSCOPY  07/04/2004   RMR: Diminutive rectal polyps, removed with cold biopsy forceps, otherwise normal/  Minimal left-sided diverticula, the remainder of the colonic mucosa normal. Hyerplastic  . COLONOSCOPY N/A 03/27/2013   Dr.Rourk-prominent internal hemorrhoids o/w normal rectum. scattered sigmoid diverticula with associated areas of submucosal petechiae. two 73m diminutive polypoid lesions opposite the ileocecal valve the remainder of the colonic mucosa appeared normal. bx= tubular adenoma, benign hyperemic colorectal mucosa with focal active colitis  . FOOT SURGERY Left    X 2-otif  . FOOT SURGERY Right 2003  . HEMORRHOID BANDING    . KNEE SURGERY Left 1973   open  . OLECRANON BURSECTOMY Right 03/29/2014   Procedure: EXCISION OF RIGHT  OLECRANON BURSA;  Surgeon: WSanjuana Kava MD;  Location: [facility];  Service: Orthopedics;  Laterality: Right;  . TOTAL KNEE ARTHROPLASTY Left 06/29/2016   Procedure: LEFT TOTAL KNEE ARTHROPLASTY;  Surgeon: RElsie Saas MD;  [facility] [facility]  Service: Orthopedics;  Laterality: Left;    There were no vitals filed for this visit.      Subjective Assessment - 08/04/16 0900    Subjective Patient arrives stating that he is making progress; he is frustrated that his progress is slower than he thought. The hardest thing for him to do right now is sleeping due to knee achiness. He is trying to start leading with his L foot when going up stairs. He has been walking quite a bit. He still has not returned to golf/hunting/etc, he still has not been walking out through swamps.    Pertinent History Multiple fractures from sports over the years. History of left knee surgery in 1974 for debridement.     How long can you sit comfortably? 1/2- can sit through a meal but could not sit for 4 hours to play poker    How long can you stand comfortably? 1/2- no limits    How long can you walk comfortably? 1/2- no limits    Patient Stated Goals Return to hunting and playing golf.    Currently in Pain? No/denies  did some stretches before he came             [facility] - 08[facility] 0001      Observation/Other Assessments  Observations 10.3 5x STS      AROM   Left Knee Extension 7  4 after manual overpressure drill    Left Knee Flexion 105  111 after manual      Strength   Right Hip Flexion 4+/5   Right Hip Extension 4/5   Right Hip ABduction 5/5   Left Hip Flexion 4+/5   Left Hip Extension 4/5   Left Hip ABduction 4+/5   Right Knee Flexion 4+/5   Right Knee Extension 5/5   Left Knee Flexion 4/5   Left Knee Extension 5/5   Right Ankle Dorsiflexion 5/5   Left Ankle Dorsiflexion 5/5     Ambulation/Gait   Gait Comments reduced L knee ROM; able to reciiprocally do stiars with B railings but  limited by L knee ROM and eccentric strength      6 minute walk test results    Aerobic Endurance Distance Walked 1501   Endurance additional comments 6MWT no device   gait speed 1.3ms                      [facility] Adult PT Treatment/Exercise - 08/04/16 0001      Knee/Hip Exercises: Stretches   Active Hamstring Stretch Both;3 reps;30 seconds   Knee: Self-Stretch to increase Flexion Left   Knee: Self-Stretch Limitations 15 reps x15 seconds       Knee flexion tib on femur grade III 3x30 seconds in supine; manual overpressure to knee extension 1x10 in supine           PT Education - 08/04/16 0943    Education provided Yes   Education Details Push ROM exercises even harder at home; improving ROM will allow him to improve stair navigation and assist in return to PLOF based activities. Strength largely appears WFL at this time.    Person(s) Educated Patient   Methods Explanation   Comprehension Verbalized understanding          PT Short Term Goals - 08/04/16 0925      PT SHORT TERM GOAL #1   Title After 4 weeks patient will demonstrate improved Left knee ROM AEB Lt knee flexion 7-113 degrees to improve safety getting into bathtub.    Baseline 1/2- 7 to 105 today, 4-111 after manual    Period Weeks   Status Partially Met     PT SHORT TERM GOAL #2   Title After 4 weeks patient will demonstrate ability to tolerate 6 minutes sustained AMB with overall gait speed >1.010m to improve safety with household distance AMB.    Baseline 1/2- 6MWT 1.4 m/s    Status Achieved     PT SHORT TERM GOAL #3   Title After 4 weeks patient will demonstrate imrpoved strength in Lt LE AEB MMT4/5 or greater to restore to PLOF.    Status Achieved           PT Long Term Goals - 08/04/16 092505    PT LONG TERM GOAL #1   Title After 8 weeks patient will demonstrate improved Left knee ROM AEB Lt knee flexion 4-121 degrees to improve safety getting into bathtub.    Baseline 1/2- 7 to  105; 4-111 after manual    Status On-going     PT LONG TERM GOAL #2   Title After 8 weeks patient will demonstrate ability to tolerate 6 minutes sustained AMB with overall gait speed >1.3 m/s to improve ease of IADL and return to golfing.  Baseline 1/2- gait speed 1.4 m/s    Period Weeks   Status Achieved     PT LONG TERM GOAL #3   Title After 8 weeks patient will demonstrate imrpoved strength in Lt LE AEB MMT4+/5 or greater to restore to PLOF.    Period Weeks   Status Partially Met               Plan - 08/04/16 0945    Clinical Impression Statement Re-assessment performed today. Patient appears to be making good progress with skilled PT services at this time and has met or partially met all gait and strength goals at this point. His main remaining deficit appears to be ROM as today he was only able to achieve 7-105 degrees, leaving a total of approximately 98 degrees of AROM right now. Note that after manual this did improve to 4-111 degrees with a total of 107 degrees, indicating further need for attention on ongoing techniques for ROM. This ROM deficit does appear to be limiting patient functionally as he has difficulty with stairs, gait over uneven surfaces, and cannot return to PLOF based activities which require adequate ROM. Strength at this time is not a concern as majority of tested muscle groups rate at least 4+/5 and again function appears more drastically impaired by ROM limitations. Recommend extension of skilled PT services with focus primarily on ROM and return to PLOF based activities, strength not a primary concern at this time.    Rehab Potential Good   PT Frequency 2x / week   PT Duration 4 weeks   PT Treatment/Interventions Therapeutic activities;Moist Heat;Electrical Stimulation;Cryotherapy;DME Instruction;Gait training;Stair training;Functional mobility training;Dry needling;Manual techniques;Scar mobilization;Passive range of motion;Balance training;Therapeutic  exercise   PT Next Visit Plan Manual for ROM as this appears to be main remaining defict; high level balance, edema management as needed. Strength appears largely [facility] but may progress as appropriate after manual for ROM.    PT Home Exercise Plan Eval: Heel slides, quad sets, SAQ, bent knee raise, SLR, Abduction heel slide.; replaced SAQs with quad sets, added seated extension stretch and seated knee flexion  stretch; 12/28 standing TKE   Consulted and Agree with Plan of Care Patient      Patient will benefit from skilled therapeutic intervention in order to improve the following deficits and impairments:  Abnormal gait, Decreased range of motion, Difficulty walking, Pain, Hypomobility, Decreased strength, Decreased mobility, Decreased activity tolerance, Impaired flexibility, Improper body mechanics  Visit Diagnosis: Acute pain of left knee  Difficulty in walking, not elsewhere classified  Muscle weakness (generalized)     Problem List Patient Active Problem List   Diagnosis Date Noted  . Primary localized osteoarthritis of left knee   . Rectal bleeding 03/09/2013    Deniece Ree PT, DPT [place] [address] [place], [name], [address] Phone: [phone]   Fax:  [phone]  Name: JAS BETTEN MRN: [id number] Date of Birth: 07/26/1946

## 2016-08-07 ENCOUNTER — Ambulatory Visit (HOSPITAL_COMMUNITY): Payer: Medicare Other

## 2016-08-07 DIAGNOSIS — R262 Difficulty in walking, not elsewhere classified: Secondary | ICD-10-CM

## 2016-08-07 DIAGNOSIS — M6281 Muscle weakness (generalized): Secondary | ICD-10-CM

## 2016-08-07 DIAGNOSIS — M25562 Pain in left knee: Secondary | ICD-10-CM

## 2016-08-07 NOTE — Therapy (Signed)
[place], [name], [address] Phone: [phone]   Fax:  [phone]  Physical Therapy Treatment  Patient Details  Name: Spencer [name] MRN: [id number] Date of Birth: 1945/08/30 Referring Provider: [name]   Encounter Date: 08/07/2016      PT End of Session - 08/07/16 0859    Visit Number 11   Number of Visits 18   Date for PT Re-Evaluation 08/31/16   Authorization Type [employer] medicare- G-codes done on 2016-08-06   Authorization Time Period 07/01/16-08/31/16   Authorization - Visit Number 11   Authorization - Number of Visits 19   PT Start Time 0818   PT Stop Time 1660   PT Time Calculation (min) 40 min   Activity Tolerance Patient tolerated treatment well;No increased pain   Behavior During Therapy WFL for tasks assessed/performed      Past Medical History:  Diagnosis Date  . 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 Fracture    ribs/ right wrist x3/ left ankle/cracked vertebrae-cervical/  . GERD (gastroesophageal reflux disease)   . Medical history non-contributory   . Primary localized osteoarthritis of left knee     Past Surgical History:  Procedure Laterality Date  . BASAL CELL CARCINOMA EXCISION    . COLONOSCOPY  07/04/2004   RMR: Diminutive rectal polyps, removed with cold biopsy forceps, otherwise normal/  Minimal left-sided diverticula, the remainder of the colonic mucosa normal. Hyerplastic  . COLONOSCOPY N/A 03/27/2013   Dr.Rourk-prominent internal hemorrhoids o/w normal rectum. scattered sigmoid diverticula with associated areas of submucosal petechiae. two 16m diminutive polypoid lesions opposite the ileocecal valve the remainder of the colonic mucosa appeared normal. bx= tubular adenoma, benign hyperemic colorectal mucosa with focal active colitis  . FOOT SURGERY Left    X 2-otif  . FOOT SURGERY Right 2003  . HEMORRHOID BANDING    . KNEE SURGERY Left 1973   open   . OLECRANON BURSECTOMY Right 03/29/2014   Procedure: EXCISION OF RIGHT OLECRANON BURSA;  Surgeon: WSanjuana Kava MD;  [facility];  Service: Orthopedics;  Laterality: Right;  . TOTAL KNEE ARTHROPLASTY Left 06/29/2016   Procedure: LEFT TOTAL KNEE ARTHROPLASTY;  Surgeon: RElsie Saas MD;  [facility]  Service: Orthopedics;  Laterality: Left;    There were no vitals filed for this visit.      Subjective Assessment - 08/07/16 0822    Subjective Pt reports to be doing well, HEP is performed consistently, and without pain. He is walking regularly.    Pertinent History TKA in December. Multiple fractures from sports over the years. History of left knee surgery in 1974 for debridement.     Currently in Pain? No/denies                         [facility] - 08/07/16 0001      Knee/Hip Exercises: Stretches   Active Hamstring Stretch Both;3 reps;30 seconds   Quad Stretch 3 reps;30 seconds   Quad Stretch Limitations prone c rope  Kneeling/quadruped 3x30sec   Knee: Self-Stretch to increase Flexion Left;10 seconds   Knee: Self-Stretch Limitations 10 reps, 10 second holds 12in step  3x3sec knee lunge stretch on 12" step    Other Knee/Hip Stretches Knee AAROM Flexion/Extesnion  5 minutes on Nustep during subjective collection.      Knee/Hip Exercises: Seated   Long Arc Quad Left;10 reps;Weights;3 sets  Long Arc Quad Weight 10 lbs.     Knee/Hip Exercises: Supine   Knee Extension Limitations 4 degrees   Knee Flexion Limitations 114 degrees     Knee/Hip Exercises: Prone   Hamstring Curl 10 reps;3 sets   Hamstring Curl Limitations 5lb   Prone Knee Hang Weights;3 minutes  3x60sec c 5lb                  PT Short Term Goals - 08/04/16 5170      PT SHORT TERM GOAL #1   Title After 4 weeks patient will demonstrate improved Left knee ROM AEB Lt knee flexion 7-113 degrees to improve safety getting into bathtub.    Baseline 1/2- 7 to  105 today, 4-111 after manual    Period Weeks   Status Partially Met     PT SHORT TERM GOAL #2   Title After 4 weeks patient will demonstrate ability to tolerate 6 minutes sustained AMB with overall gait speed >1.38ms to improve safety with household distance AMB.    Baseline 1/2- 6MWT 1.4 m/s    Status Achieved     PT SHORT TERM GOAL #3   Title After 4 weeks patient will demonstrate imrpoved strength in Lt LE AEB MMT4/5 or greater to restore to PLOF.    Status Achieved           PT Long Term Goals - 08/04/16 00174     PT LONG TERM GOAL #1   Title After 8 weeks patient will demonstrate improved Left knee ROM AEB Lt knee flexion 4-121 degrees to improve safety getting into bathtub.    Baseline 1/2- 7 to 105; 4-111 after manual    Status On-going     PT LONG TERM GOAL #2   Title After 8 weeks patient will demonstrate ability to tolerate 6 minutes sustained AMB with overall gait speed >1.3 m/s to improve ease of IADL and return to golfing.   Baseline 1/2- gait speed 1.4 m/s    Period Weeks   Status Achieved     PT LONG TERM GOAL #3   Title After 8 weeks patient will demonstrate imrpoved strength in Lt LE AEB MMT4+/5 or greater to restore to PLOF.    Period Weeks   Status Partially Met               Plan - 08/07/16 0900    Clinical Impression Statement Pt continues to make progress toward goals with overall strength and ROM improvements noted today, range 4-115 degrees, and now performing additional weight sreps with exercises with minimla effort. Home stretching routine is updated today. Gait speed today is performed at 1.459m. Left knee joint remains somewhat edematous which is the greatest contribution to ROM restrictions at this point, but this continues to improve gradually. No changes to POC at this time.    Rehab Potential Good   PT Frequency 2x / week   PT Duration 4 weeks   PT Treatment/Interventions Therapeutic activities;Moist Heat;Electrical  Stimulation;Cryotherapy;DME Instruction;Gait training;Stair training;Functional mobility training;Dry needling;Manual techniques;Scar mobilization;Passive range of motion;Balance training;Therapeutic exercise   PT Next Visit Plan Continue with current strengthening and mobility routine and progress as patient is able. Additional gait/balance training is appropriate at this point in time.    PT Home Exercise Plan Eval: Heel slides, quad sets, SAQ, bent knee raise, SLR, Abduction heel slide.; replaced SAQs with quad sets, added seated extension stretch and seated knee flexion stretch; 12/28 standing TKE; 08/07/16  quadruped flexion stretch.  Consulted and Agree with Plan of Care Patient   Family Member Consulted spouse       Patient will benefit from skilled therapeutic intervention in order to improve the following deficits and impairments:  Abnormal gait, Decreased range of motion, Difficulty walking, Pain, Hypomobility, Decreased strength, Decreased mobility, Decreased activity tolerance, Impaired flexibility, Improper body mechanics  Visit Diagnosis: Acute pain of left knee  Difficulty in walking, not elsewhere classified  Muscle weakness (generalized)     Problem List Patient Active Problem List   Diagnosis Date Noted  . Primary localized osteoarthritis of left knee   . Rectal bleeding 03/09/2013    9:07 AM, 08/07/16 Etta Grandchild, PT, DPT Physical Therapist at [facility] [phone] (office)     [place] [address] [place], [name], [address] Phone: [phone]   Fax:  [phone]  Name: ALISTAR MCENERY MRN: [id number] Date of Birth: Sep 25, 1945

## 2016-08-11 ENCOUNTER — Ambulatory Visit (HOSPITAL_COMMUNITY): Payer: Medicare Other

## 2016-08-11 DIAGNOSIS — M6281 Muscle weakness (generalized): Secondary | ICD-10-CM

## 2016-08-11 DIAGNOSIS — R262 Difficulty in walking, not elsewhere classified: Secondary | ICD-10-CM

## 2016-08-11 DIAGNOSIS — M25562 Pain in left knee: Secondary | ICD-10-CM | POA: Diagnosis not present

## 2016-08-11 NOTE — Therapy (Signed)
[place], [name], [address] Phone: [phone]   Fax:  [phone]  Physical Therapy Treatment  Patient Details  Name: Spencer [name] MRN: [id number] Date of Birth: 1946-04-02 Referring Provider: [name]   Encounter Date: 08/11/2016      PT End of Session - 08/11/16 0918    Visit Number 12   Number of Visits 18   Date for PT Re-Evaluation 08/31/16   Authorization Type [employer] medicare- G-codes done on 08/18/2016   Authorization Time Period 07/01/16-08/31/16   Authorization - Visit Number 12   Authorization - Number of Visits 19   PT Start Time 0905   PT Stop Time 0957   PT Time Calculation (min) 52 min   Activity Tolerance Patient tolerated treatment well;No increased pain   Behavior During Therapy WFL for tasks assessed/performed      Past Medical History:  Diagnosis Date  . 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 Fracture    ribs/ right wrist x3/ left ankle/cracked vertebrae-cervical/  . GERD (gastroesophageal reflux disease)   . Medical history non-contributory   . Primary localized osteoarthritis of left knee     Past Surgical History:  Procedure Laterality Date  . BASAL CELL CARCINOMA EXCISION    . COLONOSCOPY  07/04/2004   RMR: Diminutive rectal polyps, removed with cold biopsy forceps, otherwise normal/  Minimal left-sided diverticula, the remainder of the colonic mucosa normal. Hyerplastic  . COLONOSCOPY N/A 03/27/2013   Dr.Rourk-prominent internal hemorrhoids o/w normal rectum. scattered sigmoid diverticula with associated areas of submucosal petechiae. two 70m diminutive polypoid lesions opposite the ileocecal valve the remainder of the colonic mucosa appeared normal. bx= tubular adenoma, benign hyperemic colorectal mucosa with focal active colitis  . FOOT SURGERY Left    X 2-otif  . FOOT SURGERY Right 2003  . HEMORRHOID BANDING    . KNEE SURGERY Left 1973   open   . OLECRANON BURSECTOMY Right 03/29/2014   Procedure: EXCISION OF RIGHT OLECRANON BURSA;  Surgeon: WSanjuana Kava MD;  [facility];  Service: Orthopedics;  Laterality: Right;  . TOTAL KNEE ARTHROPLASTY Left 06/29/2016   Procedure: LEFT TOTAL KNEE ARTHROPLASTY;  Surgeon: RElsie Saas MD;  Location[facility]  Service: Orthopedics;  Laterality: Left;    There were no vitals filed for this visit.      Subjective Assessment - 08/11/16 0913    Subjective Pt stated his knee is feeling good, continues to have discomfort sleeping on Lt side and reports LBP following putting on socks/shoes thuis morning.     Pertinent History TKA in December. Multiple fractures from sports over the years. History of left knee surgery in 1974 for debridement.     Patient Stated Goals Return to hunting and playing golf.    Currently in Pain? No/denies                         [facility] Adult PT Treatment/Exercise - 08/11/16 0001      Ambulation/Gait   Ambulation/Gait Yes   Ambulation/Gait Assistance 5: Supervision   Ambulation Distance (Feet) 350 Feet   Assistive device None   Gait Pattern Step-through pattern   Gait Comments Cueing to equalize weight bearing     Knee/Hip Exercises: Stretches   Active Hamstring Stretch Both;3 reps;30 seconds   Active Hamstring Stretch Limitations standing 12in step   Knee: Self-Stretch to increase Flexion Left;10 seconds  Knee: Self-Stretch Limitations 10 reps, 10 second holds 12in step   Other Knee/Hip Stretches Knee AAROM Flexion/Extesnion  5 minutes on Nustep during subjective collection.      Knee/Hip Exercises: Standing   Rocker Board 2 minutes   SLS rt 60", Lt 11" max of 3   Gait Training 2D hip excursion gait   Other Standing Knee Exercises tandem stance 1 reps x 30"; 2 reps x 30" on foam     Knee/Hip Exercises: Prone   Hamstring Curl 15 reps   Hamstring Curl Limitations 5lb   Prone Knee Hang Weights;3 minutes  5#                   PT Short Term Goals - 08/04/16 5009      PT SHORT TERM GOAL #1   Title After 4 weeks patient will demonstrate improved Left knee ROM AEB Lt knee flexion 7-113 degrees to improve safety getting into bathtub.    Baseline 1/2- 7 to 105 today, 4-111 after manual    Period Weeks   Status Partially Met     PT SHORT TERM GOAL #2   Title After 4 weeks patient will demonstrate ability to tolerate 6 minutes sustained AMB with overall gait speed >1.70ms to improve safety with household distance AMB.    Baseline 1/2- 6MWT 1.4 m/s    Status Achieved     PT SHORT TERM GOAL #3   Title After 4 weeks patient will demonstrate imrpoved strength in Lt LE AEB MMT4/5 or greater to restore to PLOF.    Status Achieved           PT Long Term Goals - 08/04/16 03818     PT LONG TERM GOAL #1   Title After 8 weeks patient will demonstrate improved Left knee ROM AEB Lt knee flexion 4-121 degrees to improve safety getting into bathtub.    Baseline 1/2- 7 to 105; 4-111 after manual    Status On-going     PT LONG TERM GOAL #2   Title After 8 weeks patient will demonstrate ability to tolerate 6 minutes sustained AMB with overall gait speed >1.3 m/s to improve ease of IADL and return to golfing.   Baseline 1/2- gait speed 1.4 m/s    Period Weeks   Status Achieved     PT LONG TERM GOAL #3   Title After 8 weeks patient will demonstrate imrpoved strength in Lt LE AEB MMT4+/5 or greater to restore to PLOF.    Period Weeks   Status Partially Met               Plan - 08/11/16 0950    Clinical Impression Statement Pt progressing well with improved AROM 4-118 and reports of compliance with HEP.  Continued session focus on improving knee mobility with addition of gait and balance training.  Added 2D hip excursion to improve weight distribution and trunk rotation with gait mechanics as well as balance activities for knee stability.  No reports of pain through session.     Rehab Potential Good   PT Frequency  2x / week   PT Duration 4 weeks   PT Treatment/Interventions Therapeutic activities;Moist Heat;Electrical Stimulation;Cryotherapy;DME Instruction;Gait training;Stair training;Functional mobility training;Dry needling;Manual techniques;Scar mobilization;Passive range of motion;Balance training;Therapeutic exercise   PT Next Visit Plan Continue with current strengthening and mobility routine and progress as patient is able.  Continue gait/balance training is appropriate at this point in time.       Patient will  benefit from skilled therapeutic intervention in order to improve the following deficits and impairments:  Abnormal gait, Decreased range of motion, Difficulty walking, Pain, Hypomobility, Decreased strength, Decreased mobility, Decreased activity tolerance, Impaired flexibility, Improper body mechanics  Visit Diagnosis: Acute pain of left knee  Difficulty in walking, not elsewhere classified  Muscle weakness (generalized)     Problem List Patient Active Problem List   Diagnosis Date Noted  . Primary localized osteoarthritis of left knee   . Rectal bleeding 03/09/2013   Ihor [place], [place]; [place]  Aldona Lento 08/11/2016, 10:06 AM  [place], [name], [address] Phone: [phone]   Fax:  [phone]  Name: Spencer [name] MRN: [id number] Date of Birth: 06-23-1946

## 2016-08-13 ENCOUNTER — Ambulatory Visit (HOSPITAL_COMMUNITY): Payer: Medicare Other

## 2016-08-13 DIAGNOSIS — M25562 Pain in left knee: Secondary | ICD-10-CM | POA: Diagnosis not present

## 2016-08-13 DIAGNOSIS — M6281 Muscle weakness (generalized): Secondary | ICD-10-CM

## 2016-08-13 DIAGNOSIS — R262 Difficulty in walking, not elsewhere classified: Secondary | ICD-10-CM

## 2016-08-13 NOTE — Therapy (Signed)
[place], [name], [address] Phone: [phone]   Fax:  [phone]  Physical Therapy Treatment  Patient Details  Name: Spencer [name] MRN: [id number] Date of Birth: November 23, 1945 Referring Provider: [name]  Encounter Date: 08/13/2016      PT End of Session - 08/13/16 0815    Visit Number 13   Number of Visits 18   Date for PT Re-Evaluation 08/31/16   Authorization Type [employer] medicare- G-codes done on Jul 30, 2016   Authorization Time Period 07/01/16-08/31/16   Authorization - Visit Number 13   Authorization - Number of Visits 19   PT Start Time 0811   PT Stop Time 0903   PT Time Calculation (min) 52 min   Activity Tolerance Patient tolerated treatment well;No increased pain   Behavior During Therapy WFL for tasks assessed/performed      Past Medical History:  Diagnosis Date  . 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 Fracture    ribs/ right wrist x3/ left ankle/cracked vertebrae-cervical/  . GERD (gastroesophageal reflux disease)   . Medical history non-contributory   . Primary localized osteoarthritis of left knee     Past Surgical History:  Procedure Laterality Date  . BASAL CELL CARCINOMA EXCISION    . COLONOSCOPY  07/04/2004   RMR: Diminutive rectal polyps, removed with cold biopsy forceps, otherwise normal/  Minimal left-sided diverticula, the remainder of the colonic mucosa normal. Hyerplastic  . COLONOSCOPY N/A 03/27/2013   Dr.Rourk-prominent internal hemorrhoids o/w normal rectum. scattered sigmoid diverticula with associated areas of submucosal petechiae. two 46m diminutive polypoid lesions opposite the ileocecal valve the remainder of the colonic mucosa appeared normal. bx= tubular adenoma, benign hyperemic colorectal mucosa with focal active colitis  . FOOT SURGERY Left    X 2-otif  . FOOT SURGERY Right 2003  . HEMORRHOID BANDING    . KNEE SURGERY Left 1973   open   . OLECRANON BURSECTOMY Right 03/29/2014   Procedure: EXCISION OF RIGHT OLECRANON BURSA;  Surgeon: WSanjuana Kava MD;  Location: [facility];  Service: Orthopedics;  Laterality: Right;  . TOTAL KNEE ARTHROPLASTY Left 06/29/2016   Procedure: LEFT TOTAL KNEE ARTHROPLASTY;  Surgeon: RElsie Saas MD;  [place] [facility]  Service: Orthopedics;  Laterality: Left;    There were no vitals filed for this visit.      Subjective Assessment - 08/13/16 0807    Subjective Pt stated his knee is sore today, he played in the floor wrestling with grandkids yesterday, current pain scale 3/10   Pertinent History TKA in December. Multiple fractures from sports over the years. History of left knee surgery in 1974 for debridement.     Patient Stated Goals Return to hunting and playing golf.    Currently in Pain? Yes   Pain Score 3    Pain Location Knee   Pain Orientation Left   Pain Descriptors / Indicators Sore;Tightness   Pain Type Surgical pain   Aggravating Factors  certain exercises   Pain Relieving Factors walking   Effect of Pain on Daily Activities reduced            [facility] - 08/13/16 0001      Assessment   Medical Diagnosis s/p Left TKA    Referring Provider RElsie Saas  Onset Date/Surgical Date 06/29/16   Hand Dominance Right   Next MD Visit 2nd week of February   Prior Therapy Acute care only  Precautions   Precautions Knee                     [facility] Adult PT Treatment/Exercise - 08/13/16 0001      Ambulation/Gait   Ambulation/Gait Yes   Ambulation/Gait Assistance 5: Supervision   Ambulation Distance (Feet) 226 Feet   Assistive device None   Gait Pattern Step-through pattern   Gait Comments verbal and tactile cueing to improve trunk rotation during gait     Knee/Hip Exercises: Stretches   Active Hamstring Stretch Both;3 reps;30 seconds   Active Hamstring Stretch Limitations standing 12in step   Knee: Self-Stretch to increase Flexion Left;10  seconds   Knee: Self-Stretch Limitations 10 reps, 10 second holds 12in step   Gastroc Stretch 3 reps;30 seconds   Gastroc Stretch Limitations slant board     Knee/Hip Exercises: Aerobic   Stationary Bike seat 16 initial session to reduce tightness x 5'     Knee/Hip Exercises: Standing   Terminal Knee Extension Limitations RTB 15x 5"   Lateral Step Up Left;10 reps;Hand Hold: 1;Step Height: 4"   Forward Step Up 15 reps;Left;Step Height: 6"   Functional Squat 10 reps   Functional Squat Limitations 3D hip excursion   SLS Rt 48", Lt 22" max of 3   Gait Training gait training to improve weight distribution andtrunk rotation   Other Standing Knee Exercises tandem stance 3x30" on foam; tandem gait 1RT, Tandem gait on balance beam 1RT     Knee/Hip Exercises: Prone   Hamstring Curl 15 reps   Hamstring Curl Limitations 5lb   Hip Extension 15 reps                  PT Short Term Goals - 08/04/16 0925      PT SHORT TERM GOAL #1   Title After 4 weeks patient will demonstrate improved Left knee ROM AEB Lt knee flexion 7-113 degrees to improve safety getting into bathtub.    Baseline 1/2- 7 to 105 today, 4-111 after manual    Period Weeks   Status Partially Met     PT SHORT TERM GOAL #2   Title After 4 weeks patient will demonstrate ability to tolerate 6 minutes sustained AMB with overall gait speed >1.0m/s to improve safety with household distance AMB.    Baseline 1/2- 6MWT 1.4 m/s    Status Achieved     PT SHORT TERM GOAL #3   Title After 4 weeks patient will demonstrate imrpoved strength in Lt LE AEB MMT4/5 or greater to restore to PLOF.    Status Achieved           PT Long Term Goals - 08/04/16 0926      PT LONG TERM GOAL #1   Title After 8 weeks patient will demonstrate improved Left knee ROM AEB Lt knee flexion 4-121 degrees to improve safety getting into bathtub.    Baseline 1/2- 7 to 105; 4-111 after manual    Status On-going     PT LONG TERM GOAL #2   Title  After 8 weeks patient will demonstrate ability to tolerate 6 minutes sustained AMB with overall gait speed >1.3 m/s to improve ease of IADL and return to golfing.   Baseline 1/2- gait speed 1.4 m/s    Period Weeks   Status Achieved     PT LONG TERM GOAL #3   Title After 8 weeks patient will demonstrate imrpoved strength in Lt LE AEB MMT4+/5 or greater to restore to   PLOF.    Period Weeks   Status Partially Met               Plan - 08/13/16 1019    Clinical Impression Statement Pt progressing well with improving AROM 4-119 and reports of increased functional activities at home.  Continued session focus initially wiht knee mobilty with addition of gait, balance and began functional strengthening this session.  Added 3D hip excursion to improve hip mobiltiy with gait, tactile and verbal cueing to improve trunk rotation with gait.  Began stair training with min cueing for control for strengthening.  Progressed to dynamic surface with balance activities today with min A for LOB epsides for safety.     Rehab Potential Good   PT Frequency 2x / week   PT Duration 4 weeks   PT Treatment/Interventions Therapeutic activities;Moist Heat;Electrical Stimulation;Cryotherapy;DME Instruction;Gait training;Stair training;Functional mobility training;Dry needling;Manual techniques;Scar mobilization;Passive range of motion;Balance training;Therapeutic exercise   PT Next Visit Plan Continue with current strengthening and mobility routine and progress as patient is able.  Continue gait/balance training is appropriate at this point in time. Progress to step down training as able.   PT Home Exercise Plan Eval: Heel slides, quad sets, SAQ, bent knee raise, SLR, Abduction heel slide.; replaced SAQs with quad sets, added seated extension stretch and seated knee flexion stretch; 12/28 standing TKE; 08/07/16  quadruped flexion stretch.       Patient will benefit from skilled therapeutic intervention in order to  improve the following deficits and impairments:  Abnormal gait, Decreased range of motion, Difficulty walking, Pain, Hypomobility, Decreased strength, Decreased mobility, Decreased activity tolerance, Impaired flexibility, Improper body mechanics  Visit Diagnosis: Acute pain of left knee  Difficulty in walking, not elsewhere classified  Muscle weakness (generalized)     Problem List Patient Active Problem List   Diagnosis Date Noted  . Primary localized osteoarthritis of left knee   . Rectal bleeding 03/09/2013   Ihor Austin, LPTA; [place]  Aldona Lento 08/13/2016, 11:01 AM  [place], [name], [address] Phone: [phone]   Fax:  [phone]  Name: DORN HARTSHORNE MRN: [id number] Date of Birth: [birth year]

## 2016-08-18 ENCOUNTER — Ambulatory Visit (HOSPITAL_COMMUNITY): Payer: Medicare Other

## 2016-08-18 DIAGNOSIS — M25562 Pain in left knee: Secondary | ICD-10-CM | POA: Diagnosis not present

## 2016-08-18 DIAGNOSIS — R262 Difficulty in walking, not elsewhere classified: Secondary | ICD-10-CM

## 2016-08-18 DIAGNOSIS — M6281 Muscle weakness (generalized): Secondary | ICD-10-CM

## 2016-08-18 NOTE — Therapy (Signed)
[place], [name], [address] Phone: [phone]   Fax:  [phone]  Physical Therapy Treatment  Patient Details  Name: Spencer [name] MRN: [id number] Date of Birth: 1946/03/22 Referring Provider: [name]  Encounter Date: 08/18/2016      PT End of Session - 08/18/16 0838    Visit Number 14   Number of Visits 18   Date for PT Re-Evaluation 08/31/16   Authorization Type [employer] medicare- G-codes done on 08/14/2016   Authorization Time Period 07/01/16-08/31/16   Authorization - Visit Number 14   Authorization - Number of Visits 19   PT Start Time 0818   PT Stop Time 0858   PT Time Calculation (min) 40 min   Equipment Utilized During Treatment Gait belt   Activity Tolerance Patient tolerated treatment well;No increased pain   Behavior During Therapy WFL for tasks assessed/performed      Past Medical History:  Diagnosis Date  . 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 Fracture    ribs/ right wrist x3/ left ankle/cracked vertebrae-cervical/  . GERD (gastroesophageal reflux disease)   . Medical history non-contributory   . Primary localized osteoarthritis of left knee     Past Surgical History:  Procedure Laterality Date  . BASAL CELL CARCINOMA EXCISION    . COLONOSCOPY  07/04/2004   RMR: Diminutive rectal polyps, removed with cold biopsy forceps, otherwise normal/  Minimal left-sided diverticula, the remainder of the colonic mucosa normal. Hyerplastic  . COLONOSCOPY N/A 03/27/2013   Dr.Rourk-prominent internal hemorrhoids o/w normal rectum. scattered sigmoid diverticula with associated areas of submucosal petechiae. two 24m diminutive polypoid lesions opposite the ileocecal valve the remainder of the colonic mucosa appeared normal. bx= tubular adenoma, benign hyperemic colorectal mucosa with focal active colitis  . FOOT SURGERY Left    X 2-otif  . FOOT SURGERY Right 2003  .  HEMORRHOID BANDING    . KNEE SURGERY Left 1973   open  . OLECRANON BURSECTOMY Right 03/29/2014   Procedure: EXCISION OF RIGHT OLECRANON BURSA;  Surgeon: WSanjuana Kava MD;  Location: [facility];  Service: Orthopedics;  Laterality: Right;  . TOTAL KNEE ARTHROPLASTY Left 06/29/2016   Procedure: LEFT TOTAL KNEE ARTHROPLASTY;  Surgeon: RElsie Saas MD;  [place] [facility]  Service: Orthopedics;  Laterality: Left;    There were no vitals filed for this visit.      Subjective Assessment - 08/18/16 0823    Subjective Pt reports all is going well. He took sunday off from his PT to watch soem foot ball. He wants to get back on the elliptical.    Pertinent History TKA in December. Multiple fractures from sports over the years. History of left knee surgery in 1974 for debridement.     Currently in Pain? No/denies                         [facility] Adult PT Treatment/Exercise - 08/18/16 0001      Knee/Hip Exercises: Stretches   Knee: Self-Stretch to increase Flexion 30 seconds;5 reps  kneeling in chair x5, lunge on step x2   Gastroc Stretch 3 reps;30 seconds   Gastroc Stretch Limitations slant board   Other Knee/Hip Stretches Knee AAROM Flexion/Extesnion  5 minutes on Nustep during subjective collection, Level 2     Knee/Hip Exercises: Machines for Strengthening   Cybex Knee Extension 3 plates, single leg: 2x10  Knee/Hip Exercises: Standing   Lateral Step Up 1 set;10 reps;Step Height: 6"   Forward Step Up 1 set;Left;10 reps;Step Height: 6"   Step Down 1 set;10 reps;Step Height: 4"   SLS 5x10sec firm; 5x10sec foam   Gait Training Single Leg squat: 1x15     Knee/Hip Exercises: Supine   Knee Flexion Limitations 115 degrees                  PT Short Term Goals - 08/04/16 0925      PT SHORT TERM GOAL #1   Title After 4 weeks patient will demonstrate improved Left knee ROM AEB Lt knee flexion 7-113 degrees to improve safety getting into bathtub.    Baseline 1/2- 7 to  105 today, 4-111 after manual    Period Weeks   Status Partially Met     PT SHORT TERM GOAL #2   Title After 4 weeks patient will demonstrate ability to tolerate 6 minutes sustained AMB with overall gait speed >1.76ms to improve safety with household distance AMB.    Baseline 1/2- 6MWT 1.4 m/s    Status Achieved     PT SHORT TERM GOAL #3   Title After 4 weeks patient will demonstrate imrpoved strength in Lt LE AEB MMT4/5 or greater to restore to PLOF.    Status Achieved           PT Long Term Goals - 08/04/16 05009     PT LONG TERM GOAL #1   Title After 8 weeks patient will demonstrate improved Left knee ROM AEB Lt knee flexion 4-121 degrees to improve safety getting into bathtub.    Baseline 1/2- 7 to 105; 4-111 after manual    Status On-going     PT LONG TERM GOAL #2   Title After 8 weeks patient will demonstrate ability to tolerate 6 minutes sustained AMB with overall gait speed >1.3 m/s to improve ease of IADL and return to golfing.   Baseline 1/2- gait speed 1.4 m/s    Period Weeks   Status Achieved     PT LONG TERM GOAL #3   Title After 8 weeks patient will demonstrate imrpoved strength in Lt LE AEB MMT4+/5 or greater to restore to PLOF.    Period Weeks   Status Partially Met               Plan - 08/18/16 0842    Clinical Impression Statement Pt progressing well toward all goals for DC. Noted continued improvement in functional strength, pain control, and funcitonal mobility. ROM remains somewhat unchanged over the past few sessions but [facility] more high level balance activity, and noted weakness in the left hip. VC adn educaiton on medial collapse durign stepping.    Rehab Potential Good   PT Frequency 2x / week   PT Duration 4 weeks   PT Treatment/Interventions Therapeutic activities;Moist Heat;Electrical Stimulation;Cryotherapy;DME Instruction;Gait training;Stair training;Functional mobility training;Dry needling;Manual techniques;Scar  mobilization;Passive range of motion;Balance training;Therapeutic exercise   PT Next Visit Plan Continue with current strengthening and mobility routine and progress as patient is able.  Continue gait/balance training is appropriate at this point in time. Progress to step down training as able.   PT Home Exercise Plan Eval: Heel slides, quad sets, SAQ, bent knee raise, SLR, Abduction heel slide.; replaced SAQs with quad sets, added seated extension stretch and seated knee flexion stretch; 12/28 standing TKE; 08/07/16  quadruped flexion stretch.    Consulted and Agree with Plan of Care  Patient   Family Member Consulted spouse       Patient will benefit from skilled therapeutic intervention in order to improve the following deficits and impairments:  Abnormal gait, Decreased range of motion, Difficulty walking, Pain, Hypomobility, Decreased strength, Decreased mobility, Decreased activity tolerance, Impaired flexibility, Improper body mechanics  Visit Diagnosis: Acute pain of left knee  Difficulty in walking, not elsewhere classified  Muscle weakness (generalized)     Problem List Patient Active Problem List   Diagnosis Date Noted  . Primary localized osteoarthritis of left knee   . Rectal bleeding 03/09/2013    9:02 AM, 08/18/16 Etta Grandchild, PT, DPT Physical Therapist at [facility] [phone] (office)     [place] [address] [place], [name], [address] Phone: [phone]   Fax:  [phone]  Name: Spencer [name] MRN: [id number] Date of Birth: Mar 12, 1946

## 2016-08-21 ENCOUNTER — Ambulatory Visit (HOSPITAL_COMMUNITY): Payer: Medicare Other

## 2016-08-21 DIAGNOSIS — M25562 Pain in left knee: Secondary | ICD-10-CM | POA: Diagnosis not present

## 2016-08-21 DIAGNOSIS — M6281 Muscle weakness (generalized): Secondary | ICD-10-CM

## 2016-08-21 DIAGNOSIS — R262 Difficulty in walking, not elsewhere classified: Secondary | ICD-10-CM

## 2016-08-21 NOTE — Therapy (Signed)
[place], [name], [address] Phone: [phone]   Fax:  [phone]  Physical Therapy Treatment  Patient Details  Name: Spencer [name] MRN: [id number] Date of Birth: [birth year] Referring Provider: [name]  Encounter Date: 08/21/2016      PT End of Session - 08/21/16 0841    Visit Number 15   Number of Visits 18   Date for PT Re-Evaluation 08/31/16   Authorization Type [employer] medicare- G-codes done on 2016-07-29   Authorization Time Period 07/01/16-08/31/16   Authorization - Visit Number 15   Authorization - Number of Visits 19   PT Start Time 5361   PT Stop Time 4431   PT Time Calculation (min) 40 min   Activity Tolerance Patient tolerated treatment well;No increased pain;Patient limited by fatigue   Behavior During Therapy [facility] for tasks assessed/performed      Past Medical History:  Diagnosis Date  . 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 Fracture    ribs/ right wrist x3/ left ankle/cracked vertebrae-cervical/  . GERD (gastroesophageal reflux disease)   . Medical history non-contributory   . Primary localized osteoarthritis of left knee     Past Surgical History:  Procedure Laterality Date  . BASAL CELL CARCINOMA EXCISION    . COLONOSCOPY  07/04/2004   RMR: Diminutive rectal polyps, removed with cold biopsy forceps, otherwise normal/  Minimal left-sided diverticula, the remainder of the colonic mucosa normal. Hyerplastic  . COLONOSCOPY N/A 03/27/2013   Dr.Rourk-prominent internal hemorrhoids o/w normal rectum. scattered sigmoid diverticula with associated areas of submucosal petechiae. two 74m diminutive polypoid lesions opposite the ileocecal valve the remainder of the colonic mucosa appeared normal. bx= tubular adenoma, benign hyperemic colorectal mucosa with focal active colitis  . FOOT SURGERY Left    X 2-otif  . FOOT SURGERY Right 2003  . HEMORRHOID BANDING    . KNEE  SURGERY Left 1973   open  . OLECRANON BURSECTOMY Right 03/29/2014   Procedure: EXCISION OF RIGHT OLECRANON BURSA;  Surgeon: WSanjuana Kava MD;  [facility];  Service: Orthopedics;  Laterality: Right;  . TOTAL KNEE ARTHROPLASTY Left 06/29/2016   Procedure: LEFT TOTAL KNEE ARTHROPLASTY;  Surgeon: RElsie Saas MD;  [facility]  Service: Orthopedics;  Laterality: Left;    There were no vitals filed for this visit.      Subjective Assessment - 08/21/16 0819    Subjective Pt reports he is feeling well. He is working on his HEP continually. No pain or stiffness in the knee this morning. He has not tried the eliptical yet at home.    Pertinent History TKA in December. Multiple fractures from sports over the years. History of left knee surgery in 1974 for debridement.     Currently in Pain? No/denies                         [facility] Adult PT Treatment/Exercise - 08/21/16 0001      Knee/Hip Exercises: Stretches   Quad Stretch 3 reps;30 seconds   Quad Stretch Limitations prone c rope   Knee: Self-Stretch to increase Flexion 30 seconds;5 reps  kneeling in chair x5, lunge on step x2   Knee: Self-Stretch Limitations 15 reps, 10 second holds 12in step   Other Knee/Hip Stretches Knee AAROM Flexion/Extesnion  10 minutes on Nustep, Level 2 (before session)     Knee/Hip Exercises: Aerobic   Nustep 10  minutes, level 2, prior to session.   seat adn arms at 11.      Knee/Hip Exercises: Machines for Strengthening   Cybex Knee Extension 3 plates, single leg: 2x10   Cybex Knee Flexion 3 plates, single leg: 2x10     Knee/Hip Exercises: Standing   Lateral Step Up 1 set;10 reps;Step Height: 4";Left   Step Down 1 set;10 reps;Left;Step Height: 2"  "single leg squat"   SLS 5x10sec firm alt sides; 5x5sec bosu firm up     Knee/Hip Exercises: Supine   Knee Flexion Limitations 119 degrees                  PT Short Term Goals - 08/04/16 0925      PT SHORT TERM GOAL #1    Title After 4 weeks patient will demonstrate improved Left knee ROM AEB Lt knee flexion 7-113 degrees to improve safety getting into bathtub.    Baseline 1/2- 7 to 105 today, 4-111 after manual    Period Weeks   Status Partially Met     PT SHORT TERM GOAL #2   Title After 4 weeks patient will demonstrate ability to tolerate 6 minutes sustained AMB with overall gait speed >1.78ms to improve safety with household distance AMB.    Baseline 1/2- 6MWT 1.4 m/s    Status Achieved     PT SHORT TERM GOAL #3   Title After 4 weeks patient will demonstrate imrpoved strength in Lt LE AEB MMT4/5 or greater to restore to PLOF.    Status Achieved           PT Long Term Goals - 08/04/16 03532     PT LONG TERM GOAL #1   Title After 8 weeks patient will demonstrate improved Left knee ROM AEB Lt knee flexion 4-121 degrees to improve safety getting into bathtub.    Baseline 1/2- 7 to 105; 4-111 after manual    Status On-going     PT LONG TERM GOAL #2   Title After 8 weeks patient will demonstrate ability to tolerate 6 minutes sustained AMB with overall gait speed >1.3 m/s to improve ease of IADL and return to golfing.   Baseline 1/2- gait speed 1.4 m/s    Period Weeks   Status Achieved     PT LONG TERM GOAL #3   Title After 8 weeks patient will demonstrate imrpoved strength in Lt LE AEB MMT4+/5 or greater to restore to PLOF.    Period Weeks   Status Partially Met               Plan - 08/21/16 0844    Clinical Impression Statement Pt progressing well, now flexing tyhe knee to 118 degrees. His advanced HEP education is continued today, with good response to teach intervention. Session performed without any blatant fatigue, but focus on avoidance of knee valgus requires effort. No increase in pain this session. Pt is agreeabel to attempt new exercises at home befor enext session.    Rehab Potential Good   PT Frequency 2x / week   PT Duration 4 weeks   PT Treatment/Interventions  Therapeutic activities;Moist Heat;Electrical Stimulation;Cryotherapy;DME Instruction;Gait training;Stair training;Functional mobility training;Dry needling;Manual techniques;Scar mobilization;Passive range of motion;Balance training;Therapeutic exercise   PT Next Visit Plan Continue with current strengthening and mobility routine and progress as patient is able.  Continue gait/balance training is appropriate at this point in time. Progress to step down training as able.   PT Home Exercise Plan Eval: Heel slides, quad  sets, SAQ, bent knee raise, SLR, Abduction heel slide.; replaced SAQs with quad sets, added seated extension stretch and seated knee flexion stretch; 12/28 standing TKE; 08/07/16  quadruped flexion stretch.  1/19:    Consulted and Agree with Plan of Care Patient      Patient will benefit from skilled therapeutic intervention in order to improve the following deficits and impairments:  Abnormal gait, Decreased range of motion, Difficulty walking, Pain, Hypomobility, Decreased strength, Decreased mobility, Decreased activity tolerance, Impaired flexibility, Improper body mechanics  Visit Diagnosis: Acute pain of left knee  Difficulty in walking, not elsewhere classified  Muscle weakness (generalized)     Problem List Patient Active Problem List   Diagnosis Date Noted  . Primary localized osteoarthritis of left knee   . Rectal bleeding 03/09/2013    8:57 AM, 08/21/16 Etta Grandchild, PT, DPT Physical Therapist - [place] [phone] (mobile)   [place] [address] [place], [name], [address] Phone: [phone]   Fax:  [phone]  Name: Spencer [name] MRN: [id number] Date of Birth: 1946/06/06

## 2016-08-25 ENCOUNTER — Encounter (HOSPITAL_COMMUNITY): Payer: Medicare Other

## 2016-08-27 ENCOUNTER — Ambulatory Visit (HOSPITAL_COMMUNITY): Payer: Medicare Other

## 2016-08-27 DIAGNOSIS — M25562 Pain in left knee: Secondary | ICD-10-CM

## 2016-08-27 DIAGNOSIS — R262 Difficulty in walking, not elsewhere classified: Secondary | ICD-10-CM

## 2016-08-27 DIAGNOSIS — M6281 Muscle weakness (generalized): Secondary | ICD-10-CM

## 2016-08-27 NOTE — Therapy (Signed)
PHYSICAL THERAPY DISCHARGE SUMMARY  Visits from Start of Care: 16  Current functional level related to goals / functional outcomes: *see below    Remaining deficits: *see below    Education / Equipment: *see below  Plan: Patient agrees to discharge.  Patient goals were met. Patient is being discharged due to meeting the stated rehab goals.  ?????         Reassessment performed today, patient having now achieved all goals of treatment with ROM WNL, independence with advanced HEP for return to strengthening on home gym equipment, community distance ambulation, and tolerance of aerobic equiopment for exercise. Sustained walking speed is now >1.68ms with good symetry. Pt most limited by mild persistent joint edema and theater sign, which are both somewhat expected related to the pt's current timeframe.    11:30 AM, 08/27/16 AEtta Grandchild PT, DPT Physical Therapist - [phone])                [id number] [address]Phone: [phone]  Fax:  [phone] Physical Therapy Treatment  Patient Details  Name: Spencer [name]: [id number] of Birth: 8Aug 10, 1947Referring Provider: RElsie Saas  Encounter Date: 08/27/2016      PT End of Session - 08/27/16 1122    Visit Number 16   Number of Visits 18   Date for PT Re-Evaluation 08/31/16   Authorization Type [employer] medicare- G-codes done on 1January 02, 2018  Authorization Time Period 07/01/16-08/31/16   Authorization - Visit Number 16   Authorization - Number of Visits 19   PT Start Time 1031   PT Stop Time 1115   PT Time Calculation (min) 44 min   Activity Tolerance Patient tolerated treatment well   Behavior During [facility]/performed      Past Medical History:  Diagnosis Date  . 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 Fracture    ribs/  right wrist x3/ left ankle/cracked vertebrae-cervical/  . GERD (gastroesophageal reflux disease)   . Medical history non-contributory   . Primary localized osteoarthritis of left knee     Past Surgical History:  Procedure Laterality Date  . BASAL CELL CARCINOMA EXCISION    . COLONOSCOPY  07/04/2004   RMR: Diminutive rectal polyps, removed with cold biopsy forceps, otherwise normal/  Minimal left-sided diverticula, the remainder of the colonic mucosa normal. Hyerplastic  . COLONOSCOPY N/A 03/27/2013   Dr.Rourk-prominent internal hemorrhoids o/w normal rectum. scattered sigmoid diverticula with associated areas of submucosal petechiae. two 272mdiminutive polypoid lesions opposite the ileocecal valve the remainder of the colonic mucosa appeared normal. bx= tubular adenoma, benign hyperemic colorectal mucosa with focal active colitis  . FOOT SURGERY Left    X 2-otif  . FOOT SURGERY Right 2003  . HEMORRHOID BANDING    . KNEE SURGERY Left 1973   open  . OLECRANON BURSECTOMY Right 03/29/2014   Procedure: EXCISION OF RIGHT OLECRANON BURSA;  Surgeon: WaSanjuana KavaMD;  Location: [facility];  Service: Orthopedics;  Laterality: Right;  . TOTAL KNEE ARTHROPLASTY Left 06/29/2016   Procedure: LEFT TOTAL KNEE ARTHROPLASTY;  Surgeon: RoElsie SaasMD;  [place] [facility] Service: [place]ics;  Laterality: Left;    There were no vitals filed for this visit.      Subjective Assessment - 08/27/16 1049    Subjective Pt doing well today. He has had  a chance to trial all of his new advanced HEP items reports they are going well. He has tried out the eliptical machine at home for 15 minutes and felt good.    Pertinent History TKA in December. Multiple fractures from sports over the years. History of left knee surgery in 1974 for debridement.     How long can you sit comfortably? Mild stiffness thereafter, but not limited due to pain.    How long can you stand comfortably? 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    How long can you walk comfortably? Not limited, has performed up to 20 minutes without problem    Patient Stated Goals Return to hunting and playing golf.    Currently in Pain? No/denies            [facility] PT Assessment - 08/27/16 0001      Assessment   Medical Diagnosis s/p Left TKA    Referring Provider [name]    Onset Date/Surgical Date 06/29/16   Hand Dominance Right   Next MD Visit 2nd week of February   Prior Therapy Acute care only     Precautions   Precautions Knee     PROM   Left Knee Extension 5   Left Knee Flexion 118     Strength   Right Hip Flexion 5/5   Right Hip Extension 4+/5   Right Hip External Rotation  5/5   Right Hip Internal Rotation 5/5   Right Hip ABduction 5/5   Right Hip ADduction 5/5   Left Hip Flexion 5/5   Left Hip Extension 4+/5   Left Hip External Rotation 5/5   Left Hip Internal Rotation 5/5   Left Hip ABduction 5/5   Left Hip ADduction 5/5   Right Knee Flexion 5/5   Right Knee Extension 5/5   Left Knee Flexion 5/5   Left Knee Extension 5/5   Right Ankle Dorsiflexion 5/5   Right Ankle Eversion 5/5   Left Ankle Dorsiflexion 5/5     Transfers   Five time sit to stand comments  5xSTS: 7.62s; 10.3s 3WA,       Ambulation/Gait   Ambulation/Gait Assistance 7: Independent   Ambulation Distance (Feet) 675 Feet   Gait Pattern Step-through pattern   Gait velocity 1.38ms  1.060m on 12/19; 1.7m50mon 1/2; 0.95 @ evaluation                     [facility] Adult PT Treatment/Exercise - 08/27/16 0001      Knee/Hip Exercises: Supine   Single Leg Bridge Strengthening;Both;2 sets;5 reps  HEP education      Knee/Hip Exercises: Prone   Hip Extension Strengthening;Both;2 sets;5 reps   Hip Extension Limitations HEP education      Manual Therapy   Manual Therapy Joint mobilization;Soft tissue mobilization;Myofascial release   Joint Mobilization Medial and Lateral patella mobs, Grade 2, 2x30sec each    Posterior tibial glides knee at 90*, 3x30sec   Soft tissue mobilization Instrument assisted soft tissue mobilization: 3 minnutes to the left distal quads    Myofascial Release Active Release to quads combined with repeated knee flexion x 5mi52mes                   PT Short Term Goals - 08/04/16 [id number]  PT SHORT TERM GOAL #1   Title After 4 weeks patient will demonstrate improved Left knee ROM AEB Lt knee flexion 7-113 degrees  to improve safety getting into bathtub.    Baseline 1/2- 7 to 105 today, 4-111 after manual    Period Weeks   Status Partially Met     PT SHORT TERM GOAL #2   Title After 4 weeks patient will demonstrate ability to tolerate 6 minutes sustained AMB with overall gait speed >1.70ms to improve safety with household distance AMB.    Baseline 1/2- 6MWT 1.4 m/s    Status Achieved     PT SHORT TERM GOAL #3   Title After 4 weeks patient will demonstrate imrpoved strength in Lt LE AEB MMT4/5 or greater to restore to PLOF.    Status Achieved           PT Long Term Goals - 002/22/181108      PT LONG TERM GOAL #1   Title After 8 weeks patient will demonstrate improved Left knee ROM AEB Lt knee flexion 4-121 degrees to improve safety getting into bathtub.    Baseline 102-22-24 4-118    Status Achieved     PT LONG TERM GOAL #2   Title After 8 weeks patient will demonstrate ability to tolerate 6 minutes sustained AMB with overall gait speed >1.3 m/s to improve ease of IADL and return to golfing.   Baseline 1.582m and walking up to 20 minutes at home durign the week   Status Achieved     PT LONG TERM GOAL #3   Title After 8 weeks patient will demonstrate imrpoved strength in Lt LE AEB MMT4+/5 or greater to restore to PLOF.                Plan - 012018/02/22124    Clinical Impression Statement Reassessment performed today, patient having now achieved all goals of treatment with ROM WNL, independence with advanced HEP for return to strengthening on  home gym equipment, community distance ambulation, and tolerance of aerobic equiopment for exercise. Sustained walking speed is now >1.71m78mwith good symetry. Pt most limited by mild persistent joint edema and theater sign, which are both somewhat expected related to the pt's current timeframe <8 weeks post op. Pt is ready for DC. Time is taken to address all questions. Pt is pleased with current level of function.    Rehab Potential Good   PT Frequency 2x / week   PT Duration 4 weeks   PT Treatment/Interventions Therapeutic activities;Moist Heat;Electrical Stimulation;Cryotherapy;DME Instruction;Gait training;Stair training;Functional mobility training;Dry needling;Manual techniques;Scar mobilization;Passive range of motion;Balance training;Therapeutic exercise   Consulted and Agree with Plan of Care Patient      Patient will benefit from skilled therapeutic intervention in order to improve the following deficits and impairments:  Abnormal gait, Decreased range of motion, Difficulty walking, Pain, Hypomobility, Decreased strength, Decreased mobility, Decreased activity tolerance, Impaired flexibility, Improper body mechanics  Visit Diagnosis: Acute pain of left knee  Difficulty in walking, not elsewhere classified  Muscle weakness (generalized)       G-Codes - 01/22-Feb-201828    Functional Assessment Tool Used Clinical Judgment    Functional Limitation Mobility: Walking and moving around   Mobility: Walking and Moving Around Goal Status ([phone] least 1 percent but less than 20 percent impaired, limited or restricted   Mobility: Walking and Moving Around Discharge Status ([id number] least 1 percent but less than 20 percent impaired, limited or restricted      Problem List Patient Active Problem List   Diagnosis Date Noted  . Primary localized osteoarthritis of left knee   . Rectal  bleeding 03/09/2013    11:31 AM, 08/27/16 Etta Grandchild, PT, DPT Physical Therapist at [facility] [phone] (office)     [facility] [address] [place], [name], [address] Phone: [phone]   Fax:  [phone]  Name: Spencer [name] MRN: [id number] Date of Birth: 1946-04-28

## 2016-09-01 ENCOUNTER — Encounter (HOSPITAL_COMMUNITY): Payer: Medicare Other

## 2017-03-30 ENCOUNTER — Telehealth: Payer: Self-pay | Admitting: Internal Medicine

## 2017-03-30 NOTE — Telephone Encounter (Signed)
[id number]  PLEASE CALL PATIENT, HE IS HAVING BLEEDING AGAIN AND HE IS SCHEDULED WITH RMR OCT 2.  PATIENT WANTS TO KNOW IF THERE IS ANYTHING HE CAN DO UNTIL THEN AND IS IT SAFE FOR HIM TO WAIT THAT LONG?  PLEASE ADVISE.

## 2017-03-31 NOTE — Telephone Encounter (Signed)
Spoke with the pt, he said he has been bush hogging again and now he has some bright red blood in the bowl and with wiping. He is not in any pain and he has tried prep H and its not helping. He has an office visit schedule with RMR and wants to know if there is something we can send in to help him until his ov. Pt has been doing well since last ov in 2015.

## 2017-03-31 NOTE — Telephone Encounter (Signed)
He had successful eradication of hemorrhoids via banding in our office. I suspect bouncing around on tractor Bushogging may have the irritated his anorectum. We can call and Anusol HC cream 1 unit  - apply to anorectum 4 times a day. Try to lay off aggravating activities for a while -  And would offer a follow-up appointment.

## 2017-04-01 ENCOUNTER — Telehealth: Payer: Self-pay | Admitting: Internal Medicine

## 2017-04-01 MED ORDER — HYDROCORTISONE 2.5 % RE CREA: 1.0000 "application " | TOPICAL_CREAM | Freq: Four times a day (QID) | RECTAL | 0 refills | Status: DC

## 2017-04-01 NOTE — Addendum Note (Signed)
Addended by: Claudina Lick on: 04/01/2017 11:08 AM   Modules accepted: Orders

## 2017-04-01 NOTE — Telephone Encounter (Signed)
Done. See other phone note.

## 2017-04-01 NOTE — Telephone Encounter (Signed)
OV made °

## 2017-04-01 NOTE — Telephone Encounter (Signed)
Pt called to let JL know that he uses [facility].

## 2017-04-01 NOTE — Telephone Encounter (Signed)
Pt called back and said to send rx to [facility]. See other phone note. rx has been sent in.

## 2017-04-01 NOTE — Telephone Encounter (Signed)
Tried to call pt- NA, LMOM asked him to call me back and let me know which pharmacy he wants this to go to . He has 2 listed in his chart.

## 2017-04-08 ENCOUNTER — Telehealth: Payer: Self-pay | Admitting: Internal Medicine

## 2017-04-08 NOTE — Telephone Encounter (Signed)
Spoke with the pt, he said the hemorrhoids are a little better but he doesn't think the medication is reaching them and he is wondering if he can get suppositories sent in to [facility]?

## 2017-04-08 NOTE — Telephone Encounter (Signed)
Pt said he has OV in OCT and a prescription was called in to hold him until his OV but the medications isn't doing any good and he wants JL to call him at  [phone]

## 2017-04-09 NOTE — Telephone Encounter (Signed)
prescription for Anusol HC suppositories; dispense 30; one per rectum twice daily; one refill

## 2017-04-09 NOTE — Telephone Encounter (Signed)
Pt is aware. I have called in the rx to [facility].

## 2017-04-09 NOTE — Telephone Encounter (Signed)
Pt is currently using anusol HC Cream that we sent in last week. His office visit with you is on Oct.2. He said he isnt taking any medications for his knee. He will use a knee brace if he needs it but he doesn't like taking medications. He also said he was getting ready to leave to go play golf in [place].

## 2017-04-09 NOTE — Telephone Encounter (Signed)
Patient has been seen in almost 3 years it looks like. What  topical agent is taking now. What is he taking for knee pain?

## 2017-05-04 ENCOUNTER — Encounter: Payer: Self-pay | Admitting: Internal Medicine

## 2017-05-04 ENCOUNTER — Ambulatory Visit (INDEPENDENT_AMBULATORY_CARE_PROVIDER_SITE_OTHER): Payer: Medicare Other | Admitting: Internal Medicine

## 2017-05-04 ENCOUNTER — Other Ambulatory Visit: Payer: Self-pay

## 2017-05-04 VITALS — BP 139/86 | HR 101 | Temp 97.6°F | Ht 72.0 in | Wt 182.6 lb

## 2017-05-04 DIAGNOSIS — R197 Diarrhea, unspecified: Secondary | ICD-10-CM | POA: Diagnosis not present

## 2017-05-04 DIAGNOSIS — K625 Hemorrhage of anus and rectum: Secondary | ICD-10-CM

## 2017-05-04 DIAGNOSIS — K921 Melena: Secondary | ICD-10-CM

## 2017-05-04 DIAGNOSIS — K529 Noninfective gastroenteritis and colitis, unspecified: Secondary | ICD-10-CM

## 2017-05-04 MED ORDER — PEG 3350-KCL-NA BICARB-NACL 420 G PO SOLR: 4000.0000 mL | ORAL | 0 refills | Status: DC

## 2017-05-04 NOTE — Patient Instructions (Signed)
Submitted PA info for TCS via [facility] website. No PA needed. Decision ID# [id number].

## 2017-05-04 NOTE — Progress Notes (Signed)
Primary Care Physician:  [facility] Primary Gastroenterologist:  Dr. Gala Romney  Pre-Procedure History & Physical: HPI:  Spencer [name] is a 71 y.o. male here for further evaluation of recurrent rectal bleeding and chronic diarrhea. History of symptomatic hemorrhoids did well with hemorrhoid banding a few years ago. 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 Also,  chronic diarrhea which we describes as nonbloody. History of colonic adenoma removed a few years ago.  Past Medical History:  Diagnosis Date  . 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 Fracture    ribs/ right wrist x3/ left ankle/cracked vertebrae-cervical/  . GERD (gastroesophageal reflux disease)   . Medical history non-contributory   . Primary localized osteoarthritis of left knee     Past Surgical History:  Procedure Laterality Date  . BASAL CELL CARCINOMA EXCISION    . COLONOSCOPY  07/04/2004   RMR: Diminutive rectal polyps, removed with cold biopsy forceps, otherwise normal/  Minimal left-sided diverticula, the remainder of the colonic mucosa normal. Hyerplastic  . COLONOSCOPY N/A 03/27/2013   Dr.Domonique Cothran-prominent internal hemorrhoids o/w normal rectum. scattered sigmoid diverticula with associated areas of submucosal petechiae. two 48mm diminutive polypoid lesions opposite the ileocecal valve the remainder of the colonic mucosa appeared normal. bx= tubular adenoma, benign hyperemic colorectal mucosa with focal active colitis  . FOOT SURGERY Left    X 2-otif  . FOOT SURGERY Right 2003  . HEMORRHOID BANDING    . KNEE SURGERY Left 1973   open  . OLECRANON BURSECTOMY Right 03/29/2014   Procedure: EXCISION OF RIGHT OLECRANON BURSA;  Surgeon: [name], MD;  Location: [facility];  Service: Orthopedics;  Laterality: Right;  . TOTAL KNEE ARTHROPLASTY Left 06/29/2016   Procedure: LEFT TOTAL KNEE ARTHROPLASTY;  Surgeon: [name], MD;   Location: [facility];  Service: Orthopedics;  Laterality: Left;    Prior to Admission medications   Medication Sig Start Date End Date Taking? Authorizing Provider  aspirin 81 MG chewable tablet Chew 81 mg by mouth daily.   Yes [provider]  famotidine (PEPCID AC) 10 MG chewable tablet Chew 10 mg by mouth at bedtime.   Yes [provider]  Melatonin 3 MG TABS Take 3 mg by mouth at bedtime.   Yes [provider]  Multiple Vitamin (MULTIVITAMIN WITH MINERALS) TABS tablet Take 1 tablet by mouth daily.   Yes [provider]  Omega-3 Fatty Acids (FISH OIL) 1200 MG CAPS Take 1,200 mg by mouth daily with supper.   Yes [provider]  omeprazole (PRILOSEC OTC) 20 MG tablet Take 20 mg by mouth daily as needed (for late night snacking).   Yes [provider]  Prasterone, DHEA, (DHEA 50 PO) Take by mouth daily.   Yes [provider]  aspirin EC 325 MG EC tablet 1 tab a day for the next 30 days to prevent blood clots Patient not taking: Reported on 05/04/2017 06/30/16   Shepperson, Kirstin, PA-C  docusate sodium (COLACE) 100 MG capsule 1 tab 2 times a day while on narcotics.  STOOL SOFTENER Patient not taking: Reported on 05/04/2017 06/30/16   Shepperson, Kirstin, PA-C  hydrocortisone (ANUSOL-HC) 2.5 % rectal cream Place 1 application rectally 4 (four) times daily. Patient not taking: Reported on 05/04/2017 04/01/17   [name], MD  oxyCODONE (OXY IR/ROXICODONE) 5 MG immediate release tablet 1-2 tablets every 4-6 hrs as needed for pain Patient  not taking: Reported on 05/04/2017 06/30/16   Shepperson, Kirstin, PA-C  polyethylene glycol (MIRALAX / GLYCOLAX) packet 17grams in 6 oz of water twice a day until bowel movement.  LAXITIVE.  Restart if two days since last bowel movement Patient not taking: Reported on 05/04/2017 06/30/16   Shepperson, Kirstin, PA-C    Allergies as of 05/04/2017 - Review Complete 05/04/2017  Allergen Reaction Noted  .  No known allergies  06/26/2016    Family History  Problem Relation Age of Onset  . Heart failure Mother   . COPD Sister   . Colon cancer Neg Hx     Social History   Social History  . Marital status: Married    Spouse name: N/A  . Number of children: N/A  . Years of education: N/A   Occupational History  . retired     Scientist, physiological of Students at [employer]  . Smoking status: Former Smoker    Packs/day: 0.50    Years: 5.00    Types: Cigarettes    Quit date: 03/27/1975  . Smokeless tobacco: Former Systems developer    Quit date: 08/03/1978  . Alcohol use Yes     Comment: beer daily 2-3  . Drug use: No  . Sexual activity: Yes    Birth control/ protection: None   Other Topics Concern  . Not on file   Social History Narrative  . No narrative on file    Review of Systems: See HPI, otherwise negative ROS  Physical Exam: BP 139/86   Pulse (!) 101   Temp 97.6 F (36.4 C) (Oral)   Ht 6' (1.829 m)   Wt 182 lb 9.6 oz (82.8 kg)   BMI 24.77 kg/m  General:   Alert,  Well-developed, well-nourished, pleasant and cooperative in NAD Neck:  Supple; no masses or thyromegaly. No significant cervical adenopathy. Lungs:  Clear throughout to auscultation.   No wheezes, crackles, or rhonchi. No acute distress. Heart:  Regular rate and rhythm; no murmurs, clicks, rubs,  or gallops. Abdomen: Non-distended, normal bowel sounds.  Soft and nontender without appreciable mass or hepatosplenomegaly.  Pulses:  Normal pulses noted. Extremities:  Without clubbing or edema.  Impression:  Pleasant 71 year old gentleman with chronic diarrhea intermittent blood per rectum. History of colonic adenoma.  History of symptomatic hemorrhoids  Will schedule a diagnostic colonoscopy - chronic diarrhea and rectal bleeding - conscious sedation (early appointment).  The risks, benefits, limitations, alternatives and imponderables have been reviewed with the patient. Questions have been answered. All  parties are agreeable.   Probiotics recommended include Align, Digestive advantage and Hardin Negus colon health  Further recommendations to follow      Notice: This dictation was prepared with Dragon dictation along with smaller phrase technology. Any transcriptional errors that result from this process are unintentional and may not be corrected upon review.

## 2017-05-04 NOTE — Patient Instructions (Addendum)
Will schedule a diagnostic colonoscopy - chronic diarrhea and rectal bleeding - conscious sedation (early appointment)  Probiotics recommended include Align, Digestive advantage and [facility] colon health  Further recommendations to follow

## 2017-05-05 ENCOUNTER — Telehealth: Payer: Self-pay

## 2017-05-05 NOTE — Telephone Encounter (Signed)
Pt called office. Colonoscopy rescheduled to 06/09/17 at 8:30am. New instructions mailed. Informed Endo Scheduler.

## 2017-05-05 NOTE — Telephone Encounter (Signed)
Pt called office and LMOVM. He wants to reschedule his colonoscopy with RMR that is for 06/02/17. He forgot he will be on vacation. Tried to call pt, no answer, LMOAM.

## 2017-05-19 NOTE — Telephone Encounter (Signed)
Pt had wanted to have Colonoscopy sooner if someone canceled. I had a cancellation for 05/26/17 at 8:30am. Called pt, he would like to reschedule his Colonoscopy to 05/26/17 at 8:30am. He will come by office to pick-up new instructions. LMOVM and informed Endo scheduler.

## 2017-05-26 ENCOUNTER — Encounter (HOSPITAL_COMMUNITY)
Admission: RE | Disposition: A | Discharge status: Home / Self Care | Payer: Self-pay | Source: Ambulatory Visit | Attending: Internal Medicine

## 2017-05-26 ENCOUNTER — Encounter (HOSPITAL_COMMUNITY): Payer: Self-pay | Admitting: *Deleted

## 2017-05-26 ENCOUNTER — Ambulatory Visit (HOSPITAL_COMMUNITY)
Admission: RE | Admit: 2017-05-26 | Discharge: 2017-05-26 | Disposition: A | Discharge status: Home / Self Care | Payer: Medicare Other | Source: Ambulatory Visit | Attending: Internal Medicine | Admitting: Internal Medicine

## 2017-05-26 DIAGNOSIS — Z7982 Long term (current) use of aspirin: Secondary | ICD-10-CM | POA: Insufficient documentation

## 2017-05-26 DIAGNOSIS — K5730 Diverticulosis of large intestine without perforation or abscess without bleeding: Secondary | ICD-10-CM | POA: Diagnosis not present

## 2017-05-26 DIAGNOSIS — Z86010 Personal history of colonic polyps: Secondary | ICD-10-CM | POA: Diagnosis not present

## 2017-05-26 DIAGNOSIS — Z79899 Other long term (current) drug therapy: Secondary | ICD-10-CM | POA: Insufficient documentation

## 2017-05-26 DIAGNOSIS — K921 Melena: Secondary | ICD-10-CM | POA: Diagnosis not present

## 2017-05-26 DIAGNOSIS — Z85828 Personal history of other malignant neoplasm of skin: Secondary | ICD-10-CM | POA: Insufficient documentation

## 2017-05-26 DIAGNOSIS — Z87891 Personal history of nicotine dependence: Secondary | ICD-10-CM | POA: Diagnosis not present

## 2017-05-26 DIAGNOSIS — Z8249 Family history of ischemic heart disease and other diseases of the circulatory system: Secondary | ICD-10-CM | POA: Insufficient documentation

## 2017-05-26 DIAGNOSIS — K625 Hemorrhage of anus and rectum: Secondary | ICD-10-CM

## 2017-05-26 DIAGNOSIS — Z96652 Presence of left artificial knee joint: Secondary | ICD-10-CM | POA: Diagnosis not present

## 2017-05-26 DIAGNOSIS — K219 Gastro-esophageal reflux disease without esophagitis: Secondary | ICD-10-CM | POA: Insufficient documentation

## 2017-05-26 DIAGNOSIS — K529 Noninfective gastroenteritis and colitis, unspecified: Secondary | ICD-10-CM | POA: Insufficient documentation

## 2017-05-26 HISTORY — PX: COLONOSCOPY: SHX5424

## 2017-05-26 SURGERY — COLONOSCOPY
Anesthesia: Moderate Sedation

## 2017-05-26 MED ORDER — MEPERIDINE HCL 100 MG/ML IJ SOLN
INTRAMUSCULAR | Status: DC | PRN
  Administered 2017-05-26 (×2): 50 mg via INTRAVENOUS

## 2017-05-26 MED ORDER — ONDANSETRON HCL 4 MG/2ML IJ SOLN
INTRAMUSCULAR | Status: DC | PRN
  Administered 2017-05-26: 4 mg via INTRAVENOUS

## 2017-05-26 MED ORDER — MIDAZOLAM HCL 5 MG/5ML IJ SOLN
INTRAMUSCULAR | Status: DC | PRN
  Administered 2017-05-26 (×2): 2 mg via INTRAVENOUS

## 2017-05-26 MED ORDER — SODIUM CHLORIDE 0.9 % IV SOLN
INTRAVENOUS | Status: DC
  Administered 2017-05-26: 08:00:00 via INTRAVENOUS

## 2017-05-26 MED ORDER — STERILE WATER FOR IRRIGATION IR SOLN
Status: DC | PRN
  Administered 2017-05-26: 09:00:00

## 2017-05-26 MED ORDER — MIDAZOLAM HCL 5 MG/5ML IJ SOLN
INTRAMUSCULAR | Status: AC
  Filled 2017-05-26: qty 10

## 2017-05-26 MED ORDER — MEPERIDINE HCL 100 MG/ML IJ SOLN
INTRAMUSCULAR | Status: AC
  Filled 2017-05-26: qty 2

## 2017-05-26 MED ORDER — ONDANSETRON HCL 4 MG/2ML IJ SOLN
INTRAMUSCULAR | Status: AC
  Filled 2017-05-26: qty 2

## 2017-05-26 MED ORDER — SIMETHICONE 40 MG/0.6ML PO SUSP
ORAL | Status: AC
  Filled 2017-05-26: qty 30

## 2017-05-26 NOTE — H&P (View-Only) (Signed)
Primary Care Physician:  [facility] Primary Gastroenterologist:  Dr. Gala Romney  Pre-Procedure History & Physical: HPI:  Spencer [name] is a 71 y.o. male here for further evaluation of recurrent rectal bleeding and chronic diarrhea. History of symptomatic hemorrhoids did well with hemorrhoid banding a few years ago. 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 Also,  chronic diarrhea which we describes as nonbloody. History of colonic adenoma removed a few years ago.  Past Medical History:  Diagnosis Date  . 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 Fracture    ribs/ right wrist x3/ left ankle/cracked vertebrae-cervical/  . GERD (gastroesophageal reflux disease)   . Medical history non-contributory   . Primary localized osteoarthritis of left knee     Past Surgical History:  Procedure Laterality Date  . BASAL CELL CARCINOMA EXCISION    . COLONOSCOPY  07/04/2004   RMR: Diminutive rectal polyps, removed with cold biopsy forceps, otherwise normal/  Minimal left-sided diverticula, the remainder of the colonic mucosa normal. Hyerplastic  . COLONOSCOPY N/A 03/27/2013   Dr.Lucius Wise-prominent internal hemorrhoids o/w normal rectum. scattered sigmoid diverticula with associated areas of submucosal petechiae. two 67mm diminutive polypoid lesions opposite the ileocecal valve the remainder of the colonic mucosa appeared normal. bx= tubular adenoma, benign hyperemic colorectal mucosa with focal active colitis  . FOOT SURGERY Left    X 2-otif  . FOOT SURGERY Right 2003  . HEMORRHOID BANDING    . KNEE SURGERY Left 1973   open  . OLECRANON BURSECTOMY Right 03/29/2014   Procedure: EXCISION OF RIGHT OLECRANON BURSA;  Surgeon: [name], MD;  Location: [facility];  Service: Orthopedics;  Laterality: Right;  . TOTAL KNEE ARTHROPLASTY Left 06/29/2016   Procedure: LEFT TOTAL KNEE ARTHROPLASTY;  Surgeon: [name], MD;   Location: [place];  Service: Orthopedics;  Laterality: Left;    Prior to Admission medications   Medication Sig Start Date End Date Taking? Authorizing Provider  aspirin 81 MG chewable tablet Chew 81 mg by mouth daily.   Yes [provider]  famotidine (PEPCID AC) 10 MG chewable tablet Chew 10 mg by mouth at bedtime.   Yes [provider]  Melatonin 3 MG TABS Take 3 mg by mouth at bedtime.   Yes [provider]  Multiple Vitamin (MULTIVITAMIN WITH MINERALS) TABS tablet Take 1 tablet by mouth daily.   Yes [provider]  Omega-3 Fatty Acids (FISH OIL) 1200 MG CAPS Take 1,200 mg by mouth daily with supper.   Yes [provider]  omeprazole (PRILOSEC OTC) 20 MG tablet Take 20 mg by mouth daily as needed (for late night snacking).   Yes [provider]  Prasterone, DHEA, (DHEA 50 PO) Take by mouth daily.   Yes [provider]  aspirin EC 325 MG EC tablet 1 tab a day for the next 30 days to prevent blood clots Patient not taking: Reported on 05/04/2017 06/30/16   Shepperson, Kirstin, PA-C  docusate sodium (COLACE) 100 MG capsule 1 tab 2 times a day while on narcotics.  STOOL SOFTENER Patient not taking: Reported on 05/04/2017 06/30/16   Shepperson, Kirstin, PA-C  hydrocortisone (ANUSOL-HC) 2.5 % rectal cream Place 1 application rectally 4 (four) times daily. Patient not taking: Reported on 05/04/2017 04/01/17   [name], MD  oxyCODONE (OXY IR/ROXICODONE) 5 MG immediate release tablet 1-2 tablets every 4-6 hrs as needed for pain Patient  not taking: Reported on 05/04/2017 06/30/16   Shepperson, Kirstin, PA-C  polyethylene glycol (MIRALAX / GLYCOLAX) packet 17grams in 6 oz of water twice a day until bowel movement.  LAXITIVE.  Restart if two days since last bowel movement Patient not taking: Reported on 05/04/2017 06/30/16   Shepperson, Kirstin, PA-C    Allergies as of 05/04/2017 - Review Complete 05/04/2017  Allergen Reaction Noted  .  No known allergies  06/26/2016    Family History  Problem Relation Age of Onset  . Heart failure Mother   . COPD Sister   . Colon cancer Neg Hx     Social History   Social History  . Marital status: Married    Spouse name: N/A  . Number of children: N/A  . Years of education: N/A   Occupational History  . retired     Scientist, physiological of Students at [employer]  . Smoking status: Former Smoker    Packs/day: 0.50    Years: 5.00    Types: Cigarettes    Quit date: 03/27/1975  . Smokeless tobacco: Former Systems developer    Quit date: 08/03/1978  . Alcohol use Yes     Comment: beer daily 2-3  . Drug use: No  . Sexual activity: Yes    Birth control/ protection: None   Other Topics Concern  . Not on file   Social History Narrative  . No narrative on file    Review of Systems: See HPI, otherwise negative ROS  Physical Exam: BP 139/86   Pulse (!) 101   Temp 97.6 F (36.4 C) (Oral)   Ht 6' (1.829 m)   Wt 182 lb 9.6 oz (82.8 kg)   BMI 24.77 kg/m  General:   Alert,  Well-developed, well-nourished, pleasant and cooperative in NAD Neck:  Supple; no masses or thyromegaly. No significant cervical adenopathy. Lungs:  Clear throughout to auscultation.   No wheezes, crackles, or rhonchi. No acute distress. Heart:  Regular rate and rhythm; no murmurs, clicks, rubs,  or gallops. Abdomen: Non-distended, normal bowel sounds.  Soft and nontender without appreciable mass or hepatosplenomegaly.  Pulses:  Normal pulses noted. Extremities:  Without clubbing or edema.  Impression:  Pleasant 71 year old gentleman with chronic diarrhea intermittent blood per rectum. History of colonic adenoma.  History of symptomatic hemorrhoids  Will schedule a diagnostic colonoscopy - chronic diarrhea and rectal bleeding - conscious sedation (early appointment).  The risks, benefits, limitations, alternatives and imponderables have been reviewed with the patient. Questions have been answered. All  parties are agreeable.   Probiotics recommended include Align, Digestive advantage and Hardin Negus colon health  Further recommendations to follow      Notice: This dictation was prepared with Dragon dictation along with smaller phrase technology. Any transcriptional errors that result from this process are unintentional and may not be corrected upon review.

## 2017-05-26 NOTE — Op Note (Signed)
[facility] Patient Name: Spencer [name] Procedure Date: 05/26/2017 8:06 AM MRN: [id number] Date of Birth: [birth year] Attending MD: Norvel Richards , MD CSN: [id number] Age: 71 Admit Type: Outpatient Procedure:                Colonoscopy Indications:              Hematochezia; Diarrhea Providers:                Norvel Richards, MD, Hinton Rao, RN, Selena Lesser, Randa Spike, Technician Referring MD:              Medicines:                Midazolam 4 mg IV, Meperidine 100 mg IV,                            Ondansetron 4 mg IV Complications:            No immediate complications. Estimated Blood Loss:     Estimated blood loss was minimal. Procedure:                Pre-Anesthesia Assessment:                           - Prior to the procedure, a History and Physical                            was performed, and patient medications and                            allergies were reviewed. The patient's tolerance of                            previous anesthesia was also reviewed. The risks                            and benefits of the procedure and the sedation                            options and risks were discussed with the patient.                            All questions were answered, and informed consent                            was obtained. Prior Anticoagulants: The patient has                            taken no previous anticoagulant or antiplatelet                            agents. ASA Grade Assessment: II - A patient with  mild systemic disease. After reviewing the risks                            and benefits, the patient was deemed in                            satisfactory condition to undergo the procedure.                           After obtaining informed consent, the colonoscope                            was passed under direct vision. Throughout the                            procedure,  the patient's blood pressure, pulse, and                            oxygen saturations were monitored continuously. The                            EC-[id number] ([id number]) scope was introduced through                            the anus and advanced to the 5 cm into the ileum.                            The colonoscopy was performed without difficulty.                            The patient tolerated the procedure well. The                            quality of the bowel preparation was adequate. The                            entire colon was well visualized. Anatomical                            landmarks were photographed. Scope In: 8:47:28 AM Scope Out: 9:08:38 AM Scope Withdrawal Time: 0 hours 16 minutes 3 seconds  Total Procedure Duration: 0 hours 21 minutes 10 seconds  Findings:      The perianal and digital rectal examinations were normal.      Multiple small and large-mouthed diverticula were found in the sigmoid       colon and descending colon.      Patchy moderate inflammation characterized by congestion (edema),       erythema, friability, granularity and loss of vascularity was found.       findings most pronounced in the ascending segment to the cecum. Distal 5       cm of terminal ileum mucosa appeared no Appeared to be skipped       areas/areas of sparing throughout the colon.The rectal mucosa appeared       endoscopically normal.No tumor seen. segmental biopsies  take This was       biopsied with a cold forceps for histology. Estimated blood loss was       minimal.      The exam was otherwise without abnormality on direct and retroflexion       views side from area of scar in the distal rectum. Impression:               - Diverticulosis in the sigmoid colon and in the                            descending colon.                           - Patchy moderate inflammation was found. Biopsied.                           - The examination was otherwise normal on direct                             and retroflexion views. Patient states bleeding and                            diarrhea resolved a week or 2 ago. Findings not                            consistent with ischemia. It may well be patient                            has a resolving infectious colitis. New onset IBD                            remains in differential. Moderate Sedation:      Moderate (conscious) sedation was administered by the endoscopy nurse       and supervised by the endoscopist. The following parameters were       monitored: oxygen saturation, heart rate, blood pressure, respiratory       rate, EKG, adequacy of pulmonary ventilation, and response to care.       Total physician intraservice time was 28 minutes. Recommendation:           - Patient has a contact number available for                            emergencies. The signs and symptoms of potential                            delayed complications were discussed with the                            patient. Return to normal activities tomorrow.                            Written discharge instructions were provided to the  patient.                           - Resume previous diet.                           - Continue present medications.                           - Await pathology results.                           - Repeat colonoscopy date to be determined after                            pending pathology results are reviewed for                            surveillance based on pathology results.                           - Return to GI clinic in 6 weeks. Procedure Code(s):        --- Professional ---                           [phone], Colonoscopy, flexible; with biopsy, single                            or multiple                           99152, Moderate sedation services provided by the                            same physician or other qualified health care                            professional  performing the diagnostic or                            therapeutic service that the sedation supports,                            requiring the presence of an independent trained                            observer to assist in the monitoring of the                            patient's level of consciousness and physiological                            status; initial 15 minutes of intraservice time,                            patient age 15 years or older  [address], Moderate sedation services; each additional                            15 minutes intraservice time Diagnosis Code(s):        --- Professional ---                           K52.9, Noninfective gastroenteritis and colitis,                            unspecified                           K92.1, Melena (includes Hematochezia)                           K57.30, Diverticulosis of large intestine without                            perforation or abscess without bleeding CPT copyright 2016 [employer]. All rights reserved. The codes documented in this report are preliminary and upon coder review may  be revised to meet current compliance requirements. Cristopher Estimable. Gesselle Fitzsimons, MD Norvel Richards, MD 05/26/2017 9:26:56 AM This report has been signed electronically. Number of Addenda: 0

## 2017-05-26 NOTE — Discharge Instructions (Signed)
Colitis Colitis is inflammation of the colon. Colitis may last a short time (acute) or it may last a long time (chronic). What are the causes? This condition may be caused by:  Viruses.  Bacteria.  Reactions to medicine.  Certain autoimmune diseases, such as Crohn disease or ulcerative colitis.  What are the signs or symptoms? Symptoms of this condition include:  Diarrhea.  Passing bloody or tarry stool.  Pain.  Fever.  Vomiting.  Tiredness (fatigue).  Weight loss.  Bloating.  Sudden increase in abdominal pain.  Having fewer bowel movements than usual.  How is this diagnosed? This condition is diagnosed with a stool test or a blood test. You may also have other tests, including X-rays, a CT scan, or a colonoscopy. How is this treated? Treatment may include:  Resting the bowel. This involves not eating or drinking for a period of time.  Fluids that are given through an IV tube.  Medicine for pain and diarrhea.  Antibiotic medicines.  Cortisone medicines.  Surgery.  Follow these instructions at home: Eating and drinking  Follow instructions from your health care provider about eating or drinking restrictions.  Drink enough fluid to keep your urine clear or pale yellow.  Work with a dietitian to determine which foods cause your condition to flare up.  Avoid foods that cause flare-ups.  Eat a well-balanced diet. Medicines  Take over-the-counter and prescription medicines only as told by your health care provider.  If you were prescribed an antibiotic medicine, take it as told by your health care provider. Do not stop taking the antibiotic even if you start to feel better. General instructions  Keep all follow-up visits as told by your health care provider. This is important. Contact a health care provider if:  Your symptoms do not go away.  You develop new symptoms. Get help right away if:  You have a fever that does not go away with  treatment.  You develop chills.  You have extreme weakness, fainting, or dehydration.  You have repeated vomiting.  You develop severe pain in your abdomen.  You pass bloody or tarry stool. This information is not intended to replace advice given to you by your health care provider. Make sure you discuss any questions you have with your health care provider. Document Released: 08/27/2004 Document Revised: 12/26/2015 Document Reviewed: 11/12/2014 Elsevier Interactive Patient Education  [employer]. Diverticulosis Diverticulosis is a condition that develops when small pouches (diverticula) form in the wall of the large intestine (colon). The colon is where water is absorbed and stool is formed. The pouches form when the inside layer of the colon pushes through weak spots in the outer layers of the colon. You may have a few pouches or many of them. What are the causes? The cause of this condition is not known. What increases the risk? The following factors may make you more likely to develop this condition:  Being older than age 31. Your risk for this condition increases with age. Diverticulosis is rare among people younger than age 18. By age 64, many people have it.  Eating a low-fiber diet.  Having frequent constipation.  Being overweight.  Not getting enough exercise.  Smoking.  Taking over-the-counter pain medicines, like aspirin and ibuprofen.  Having a family history of diverticulosis.  What are the signs or symptoms? In most people, there are no symptoms of this condition. If you do have symptoms, they may include:  Bloating.  Cramps in the abdomen.  Constipation or  diarrhea.  Pain in the lower left side of the abdomen.  How is this diagnosed? This condition is most often diagnosed during an exam for other colon problems. Because diverticulosis usually has no symptoms, it often cannot be diagnosed independently. This condition may be diagnosed by:  Using  a flexible scope to examine the colon (colonoscopy).  Taking an X-ray of the colon after dye has been put into the colon (barium enema).  Doing a CT scan.  How is this treated? You may not need treatment for this condition if you have never developed an infection related to diverticulosis. If you have had an infection before, treatment may include:  Eating a high-fiber diet. This may include eating more fruits, vegetables, and grains.  Taking a fiber supplement.  Taking a live bacteria supplement (probiotic).  Taking medicine to relax your colon.  Taking antibiotic medicines.  Follow these instructions at home:  Drink 6-8 glasses of water or more each day to prevent constipation.  Try not to strain when you have a bowel movement.  If you have had an infection before: ? Eat more fiber as directed by your health care provider or your diet and nutrition specialist (dietitian). ? Take a fiber supplement or probiotic, if your health care provider approves.  Take over-the-counter and prescription medicines only as told by your health care provider.  If you were prescribed an antibiotic, take it as told by your health care provider. Do not stop taking the antibiotic even if you start to feel better.  Keep all follow-up visits as told by your health care provider. This is important. Contact a health care provider if:  You have pain in your abdomen.  You have bloating.  You have cramps.  You have not had a bowel movement in 3 days. Get help right away if:  Your pain gets worse.  Your bloating becomes very bad.  You have a fever or chills, and your symptoms suddenly get worse.  You vomit.  You have bowel movements that are bloody or black.  You have bleeding from your rectum. Summary  Diverticulosis is a condition that develops when small pouches (diverticula) form in the wall of the large intestine (colon).  You may have a few pouches or many of them.  This  condition is most often diagnosed during an exam for other colon problems.  If you have had an infection related to diverticulosis, treatment may include increasing the fiber in your diet, taking supplements, or taking medicines. This information is not intended to replace advice given to you by your health care provider. Make sure you discuss any questions you have with your health care provider. Document Released: 04/16/2004 Document Revised: 06/08/2016 Document Reviewed: 06/08/2016 Elsevier Interactive Patient Education  2017 [place]. Colonoscopy, Adult, Care After This sheet gives you information about how to care for yourself after your procedure. Your doctor may also give you more specific instructions. If you have problems or questions, call your doctor. Follow these instructions at home: General instructions   For the first 24 hours after the procedure: ? Do not drive or use machinery. ? Do not sign important documents. ? Do not drink alcohol. ? Do your daily activities more slowly than normal. ? Eat foods that are soft and easy to digest. ? Rest often.  Take over-the-counter or prescription medicines only as told by your doctor.  It is up to you to get the results of your procedure. Ask your doctor, or the department performing  the procedure, when your results will be ready. To help cramping and bloating:  Try walking around.  Put heat on your belly (abdomen) as told by your doctor. Use a heat source that your doctor recommends, such as a moist heat pack or a heating pad. ? Put a towel between your skin and the heat source. ? Leave the heat on for 20-30 minutes. ? Remove the heat if your skin turns bright red. This is especially important if you cannot feel pain, heat, or cold. You can get burned. Eating and drinking  Drink enough fluid to keep your pee (urine) clear or pale yellow.  Return to your normal diet as told by your doctor. Avoid heavy or fried foods that  are hard to digest.  Avoid drinking alcohol for as long as told by your doctor. Contact a doctor if:  You have blood in your poop (stool) 2-3 days after the procedure. Get help right away if:  You have more than a small amount of blood in your poop.  You see large clumps of tissue (blood clots) in your poop.  Your belly is swollen.  You feel sick to your stomach (nauseous).  You throw up (vomit).  You have a fever.  You have belly pain that gets worse, and medicine does not help your pain. This information is not intended to replace advice given to you by your health care provider. Make sure you discuss any questions you have with your health care provider. Document Released: 08/22/2010 Document Revised: 04/13/2016 Document Reviewed: 04/13/2016 Elsevier Interactive Patient Education  2017 [place].  Colonoscopy Discharge Instructions  Read the instructions outlined below and refer to this sheet in the next few weeks. These discharge instructions provide you with general information on caring for yourself after you leave the hospital. Your doctor may also give you specific instructions. While your treatment has been planned according to the most current medical practices available, unavoidable complications occasionally occur. If you have any problems or questions after discharge, call Dr. Gala Romney at [phone]. ACTIVITY  You may resume your regular activity, but move at a slower pace for the next 24 hours.   Take frequent rest periods for the next 24 hours.   Walking will help get rid of the air and reduce the bloated feeling in your belly (abdomen).   No driving for 24 hours (because of the medicine (anesthesia) used during the test).    Do not sign any important legal documents or operate any machinery for 24 hours (because of the anesthesia used during the test).  NUTRITION  Drink plenty of fluids.   You may resume your normal diet as instructed by your doctor.    Begin with a light meal and progress to your normal diet. Heavy or fried foods are harder to digest and may make you feel sick to your stomach (nauseated).   Avoid alcoholic beverages for 24 hours or as instructed.  MEDICATIONS  You may resume your normal medications unless your doctor tells you otherwise.  WHAT YOU CAN EXPECT TODAY  Some feelings of bloating in the abdomen.   Passage of more gas than usual.   Spotting of blood in your stool or on the toilet paper.  IF YOU HAD POLYPS REMOVED DURING THE COLONOSCOPY:  No aspirin products for 7 days or as instructed.   No alcohol for 7 days or as instructed.   Eat a soft diet for the next 24 hours.  FINDING OUT THE RESULTS OF YOUR  TEST Not all test results are available during your visit. If your test results are not back during the visit, make an appointment with your caregiver to find out the results. Do not assume everything is normal if you have not heard from your caregiver or the medical facility. It is important for you to follow up on all of your test results.  SEEK IMMEDIATE MEDICAL ATTENTION IF:  You have more than a spotting of blood in your stool.   Your belly is swollen (abdominal distention).   You are nauseated or vomiting.   You have a temperature over 101.   You have abdominal pain or discomfort that is severe or gets worse throughout the day.     Diverticulosis and colitis information provided  Further recommendations to follow pending review of pathology report

## 2017-05-26 NOTE — Interval H&P Note (Signed)
History and Physical Interval Note:  05/26/2017 8:37 AM  Spencer [name]  has presented today for surgery, with the diagnosis of chronic diarrhea, rectal bleeding  The various methods of treatment have been discussed with the patient and family. After consideration of risks, benefits and other options for treatment, the patient has consented to  Procedure(s) with comments: COLONOSCOPY (N/A) - 7:30am as a surgical intervention .  The patient's history has been reviewed, patient examined, no change in status, stable for surgery.  I have reviewed the patient's chart and labs.  Questions were answered to the patient's satisfaction.     Spencer [name]   Patient has diarrhea and recently have resolved since office visit. Took a probiotic transiently. Diagnostic colonoscopy today per plan.  The risks, benefits, limitations, alternatives and imponderables have been reviewed with the patient. Questions have been answered. All parties are agreeable.

## 2017-05-28 ENCOUNTER — Telehealth: Payer: Self-pay | Admitting: Internal Medicine

## 2017-05-28 NOTE — Telephone Encounter (Signed)
[phone]  Please give patient a call.  Said rmr called him

## 2017-05-28 NOTE — Telephone Encounter (Signed)
Spoke with pt, he was given results, routed message to schedule 4-6 weeks f/u

## 2017-05-31 ENCOUNTER — Encounter (HOSPITAL_COMMUNITY): Payer: Self-pay | Admitting: Internal Medicine

## 2017-05-31 NOTE — Progress Notes (Signed)
PATIENT SCHEDULED  °

## 2017-06-16 ENCOUNTER — Encounter (HOSPITAL_COMMUNITY): Payer: Self-pay | Admitting: Physician Assistant

## 2017-06-16 DIAGNOSIS — M1711 Unilateral primary osteoarthritis, right knee: Secondary | ICD-10-CM | POA: Diagnosis present

## 2017-06-16 IMAGING — US US ASPIRATION
1 series · 13 of 15 positions shown · non-contrast
Comparison: none

INDICATION: Large symptomatic painful right knee Baker cyst

[Series 1: us aspiration · 0.07mm/px · 15 acquisitions, 13 frames shown]
[im 1/15]
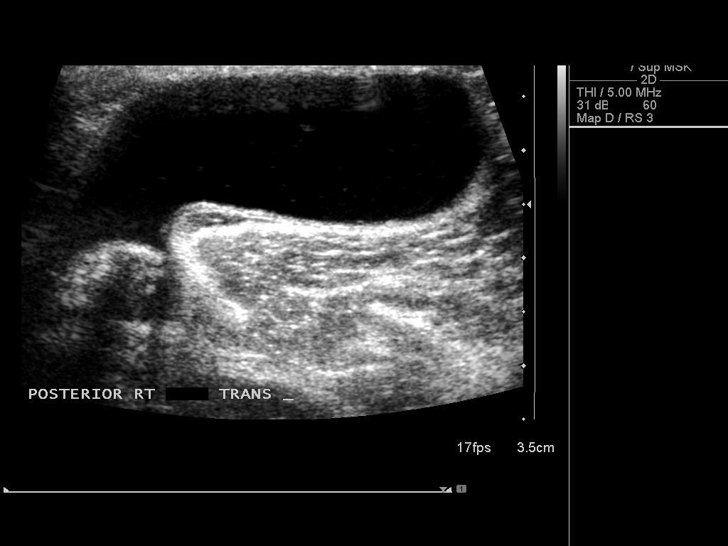
[im 2/15]
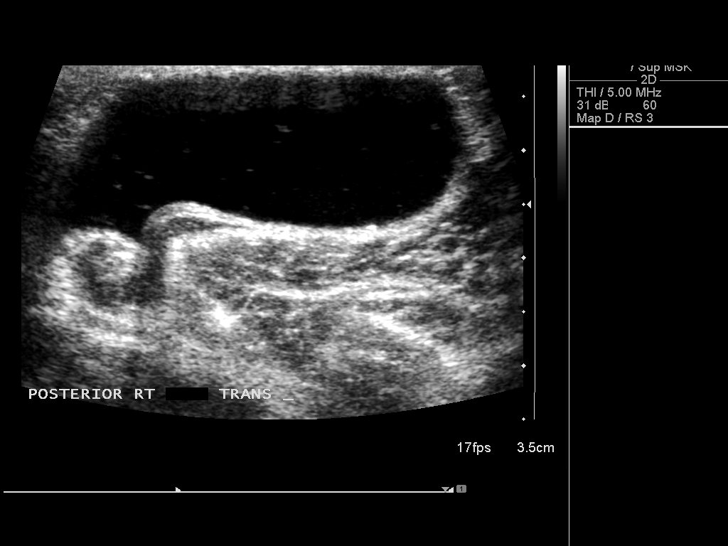
[im 3/15]
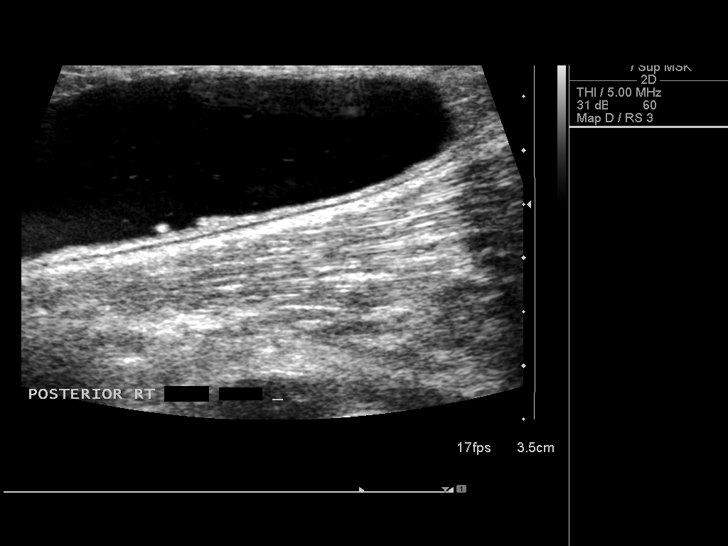
[im 5/15]
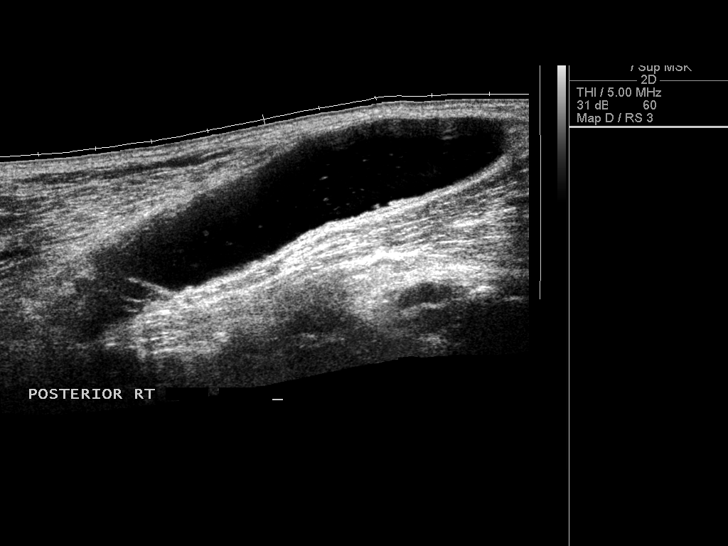
[im 6/15]
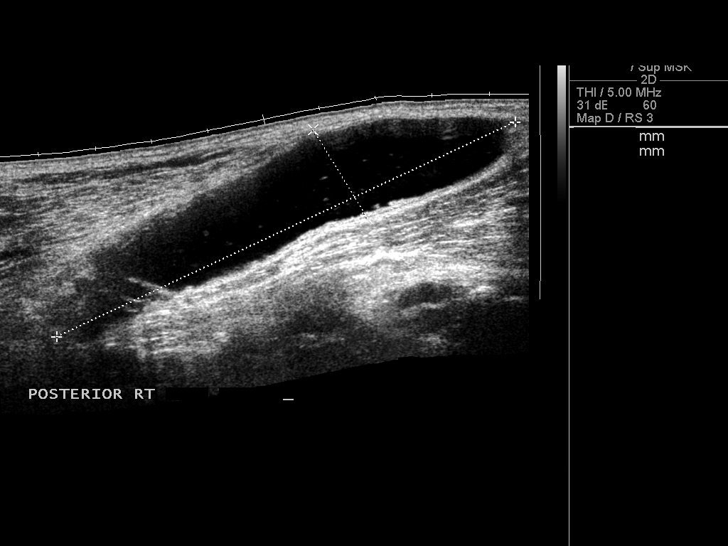
[im 7/15]
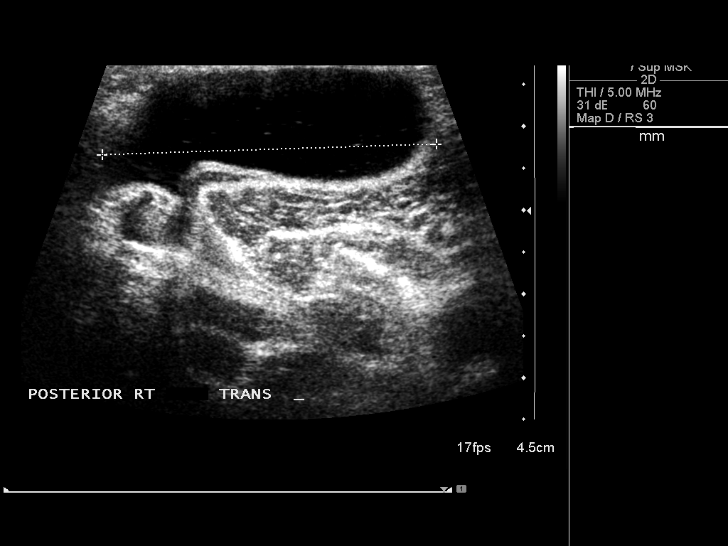
[im 8/15]
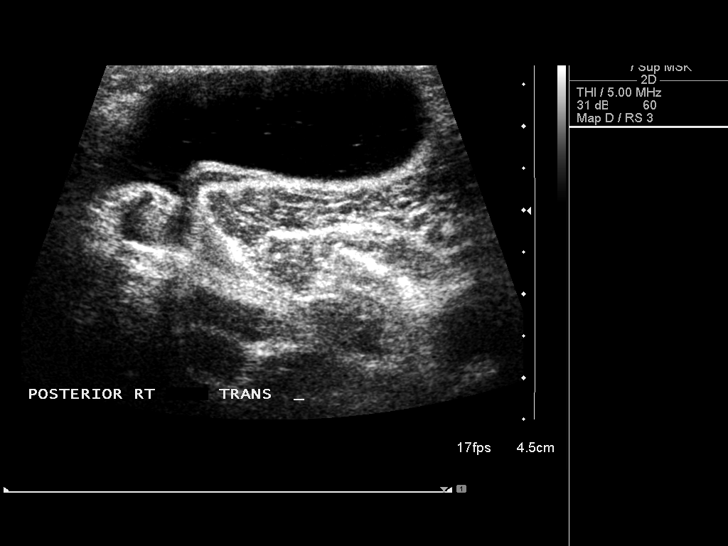
[im 9/15]
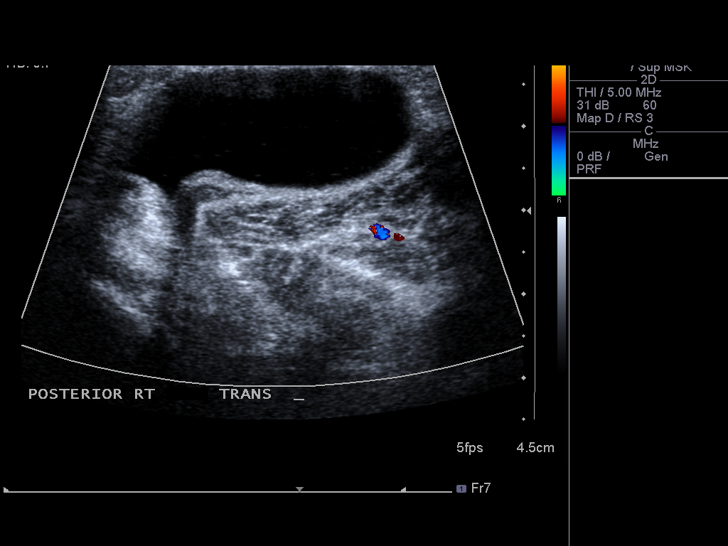
[im 10/15]
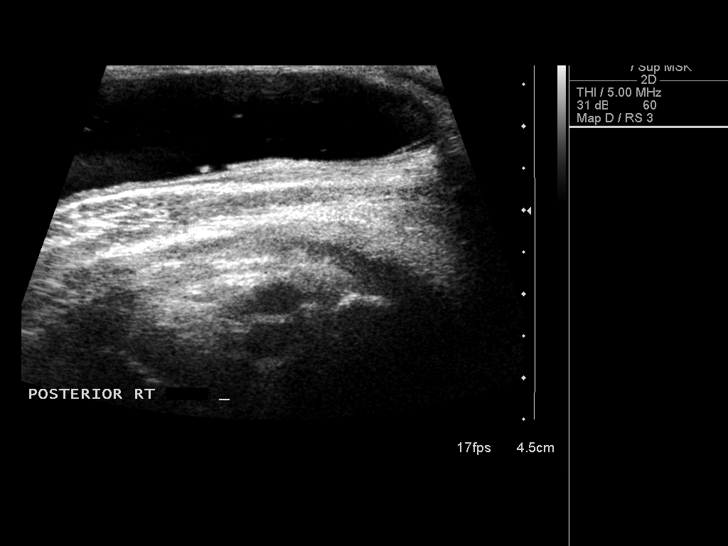
[im 11/15]
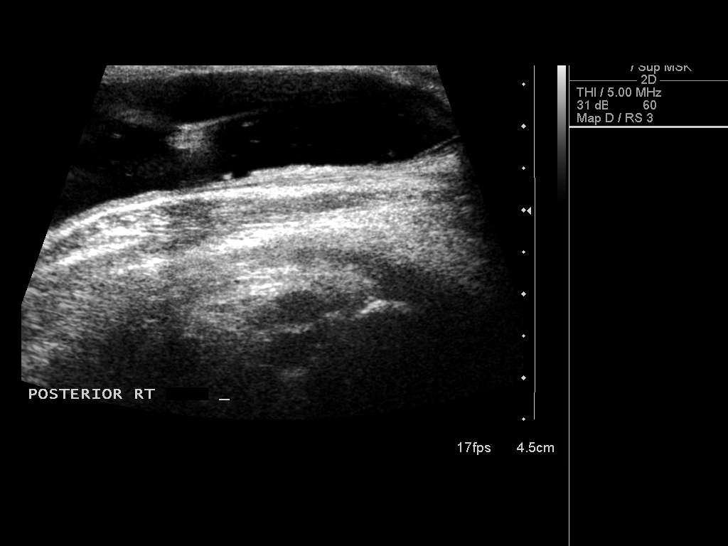
[im 13/15]
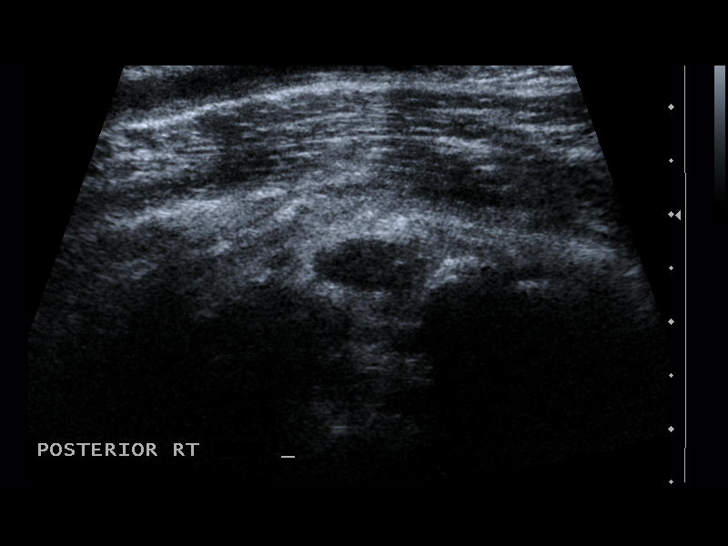
[im 14/15]
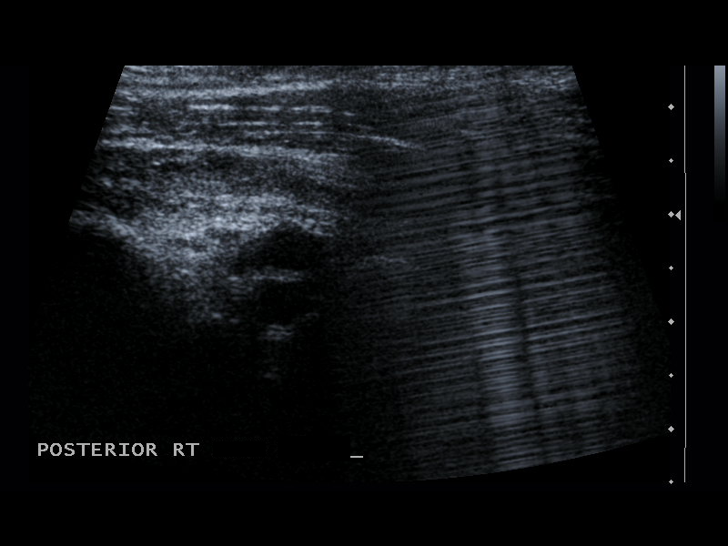
[im 15/15]
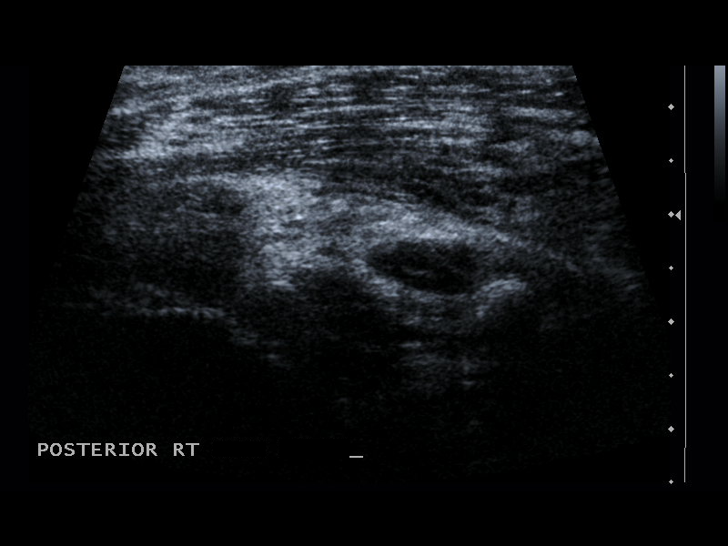

[13 of 15 positions shown; findings below may reference images not displayed]

EXAM:
US ASPIRATION

MEDICATIONS:
The patient is currently admitted to the hospital and receiving
intravenous antibiotics. The antibiotics were administered within an
appropriate time frame prior to the initiation of the procedure.

ANESTHESIA/SEDATION:
None.

COMPLICATIONS:
None immediate.

PROCEDURE:
Informed written consent was obtained from the patient after a
thorough discussion of the procedural risks, benefits and
alternatives. All questions were addressed. Maximal Sterile Barrier
Technique was utilized including caps, mask, sterile gowns, sterile
gloves, sterile drape, hand hygiene and skin antiseptic. A timeout
was performed prior to the initiation of the procedure.

Preliminary ultrasound performed of the posterior right knee. Baker
cyst was localized and skin was marked.

Under sterile conditions and local anesthesia, an 18 gauge needle
was advanced into the right knee Baker cyst. Needle position
confirmed with ultrasound. Syringe aspiration yielded 28 cc synovial
fluid. Following this, therapeutic injection performed with
injection of 160 mg Depo-Medrol diluted in 3 cc of 0.25%
bupivacaine. Needle removed. No immediate complication. Patient
tolerated the procedure well.
IMPRESSION: Successful ultrasound right knee Baker's cyst aspiration and
therapeutic injection.

## 2017-06-16 NOTE — H&P (Signed)
TOTAL KNEE ADMISSION H&P  Patient is being admitted for right total knee arthroplasty.  Subjective:  Chief Complaint:right knee pain.  HPI: Spencer [name], 71 y.o. male, has a history of pain and functional disability in the right knee due to arthritis and has failed non-surgical conservative treatments for greater than 12 weeks to includeNSAID's and/or analgesics, corticosteriod injections, viscosupplementation injections, flexibility and strengthening excercises, supervised PT with diminished ADL's post treatment, use of assistive devices, weight reduction as appropriate and activity modification.  Onset of symptoms was gradual, starting 10 years ago with gradually worsening course since that time. The patient noted no past surgery on the right knee(s).  Patient currently rates pain in the right knee(s) at 10 out of 10 with activity. Patient has night pain, worsening of pain with activity and weight bearing, pain that interferes with activities of daily living, crepitus and joint swelling.  Patient has evidence of subchondral sclerosis, periarticular osteophytes and joint space narrowing by imaging studies. There is no active infection.  Patient Active Problem List   Diagnosis Date Noted  . Primary localized osteoarthritis of right knee   . Primary localized osteoarthritis of left knee   . Rectal bleeding 03/09/2013   Past Medical History:  Diagnosis Date  . 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 Fracture    ribs/ right wrist x3/ left ankle/cracked vertebrae-cervical/  . GERD (gastroesophageal reflux disease)   . Primary localized osteoarthritis of left knee   . Primary localized osteoarthritis of right knee     Past Surgical History:  Procedure Laterality Date  . BASAL CELL CARCINOMA EXCISION    . COLONOSCOPY  07/04/2004   RMR: Diminutive rectal polyps, removed with cold biopsy forceps, otherwise normal/  Minimal left-sided diverticula, the remainder of the  colonic mucosa normal. Hyerplastic  . FOOT SURGERY Left    X 2-otif  . FOOT SURGERY Right 2003  . HEMORRHOID BANDING    . KNEE SURGERY Left 1973   open    No current facility-administered medications for this encounter.    Current Outpatient Medications  Medication Sig Dispense Refill Last Dose  . acetaminophen (TYLENOL) 500 MG tablet Take 1,000 mg every 6 (six) hours as needed by mouth for moderate pain or headache.     Marland Kitchen aspirin 81 MG chewable tablet Chew 81 mg by mouth every evening.    05/25/2017 at Unknown time  . Emollient (VASELINE INTENSIVE CARE EX) Apply 1 application at bedtime topically.    Past Week at Unknown time  . famotidine (PEPCID AC) 10 MG chewable tablet Chew 10 mg by mouth at bedtime as needed for heartburn.    05/25/2017 at 0800  . Melatonin 3 MG TABS Take 3 mg by mouth at bedtime.   05/25/2017 at Unknown time  . Multiple Vitamin (MULTIVITAMIN WITH MINERALS) TABS tablet Take 1 tablet by mouth daily.   05/24/2017  . Omega-3 Fatty Acids (FISH OIL) 1200 MG CAPS Take 1,200 mg by mouth daily with supper.   05/24/2017  . omeprazole (PRILOSEC OTC) 20 MG tablet Take 20 mg by mouth daily as needed (for late night snacking).   Past Month at Unknown time  . OVER THE COUNTER MEDICATION Take 4 capsules daily after supper by mouth. Fero Vital Supplement   05/24/2017  . Prasterone, DHEA, (DHEA 50 PO) Take 50 mg daily by mouth.    05/24/2017  . Pseudoeph-Doxylamine-DM-APAP (NYQUIL PO) Take 1 Dose at bedtime as needed by mouth (for  cold symptoms).    Past Week at Unknown time  . polyethylene glycol-electrolytes (TRILYTE) 420 g solution Take 4,000 mLs by mouth as directed. 4000 mL 0 05/26/2017 at 0530   No Known Allergies  Social History   Tobacco Use  . Smoking status: Former Smoker    Packs/day: 0.50    Years: 5.00    Pack years: 2.50    Types: Cigarettes    Last attempt to quit: 03/27/1975    Years since quitting: 42.2  . Smokeless tobacco: Former Systems developer    Quit date:  08/03/1978  Substance Use Topics  . Alcohol use: Yes    Comment: beer daily 2-3    Family History  Problem Relation Age of Onset  . Heart failure Mother   . COPD Sister   . Colon cancer Neg Hx      Review of Systems  Constitutional: Negative.   HENT: Negative.   Eyes: Negative.   Respiratory: Negative.   Cardiovascular: Negative.   Gastrointestinal: Negative.   Genitourinary: Negative.   Musculoskeletal: Positive for back pain and joint pain.  Skin: Negative.   Neurological: Negative.   Endo/Heme/Allergies: Negative.     Objective:  Physical Exam  Constitutional: He is oriented to person, place, and time. He appears well-developed and well-nourished.  HENT:  Head: Normocephalic and atraumatic.  Mouth/Throat: Oropharynx is clear and moist.  Eyes: Conjunctivae and EOM are normal. Pupils are equal, round, and reactive to light.  Neck: Neck supple.  Cardiovascular: Normal rate, regular rhythm and intact distal pulses.  Respiratory: Effort normal and breath sounds normal.  GI: Soft. Bowel sounds are normal.  Genitourinary:  Genitourinary Comments: Not pertinent to current symptomatology therefore not examined.  Musculoskeletal:  Right knee -10 to 110    2+ crep 2+ synovitis medial pain and varusLeft knee 115  Well  Healed total knee   No pain  Neurological: He is alert and oriented to person, place, and time.  Skin: Skin is warm and dry.  Psychiatric: He has a normal mood and affect. His behavior is normal.    Vital signs in last 24 hours: Temp:  [98 F (36.7 C)] 98 F (36.7 C) (11/14 1500) Pulse Rate:  [89] 89 (11/14 1500) BP: (166)/(104) 166/104 (11/14 1500) SpO2:  [97 %] 97 % (11/14 1500) Weight:  [83.5 kg (184 lb)] 83.5 kg (184 lb) (11/14 1500)  Labs:   Estimated body mass index is 24.95 kg/m as calculated from the following:   Height as of this encounter: 6' (1.829 m).   Weight as of this encounter: 83.5 kg (184 lb).   Imaging Review Plain radiographs  demonstrate severe degenerative joint disease of the right knee(s). The overall alignment issignificant varus. The bone quality appears to be good for age and reported activity level.  Assessment/Plan:  End stage arthritis, right knee  Active Problems:   Primary localized osteoarthritis of right knee  The patient history, physical examination, clinical judgment of the provider and imaging studies are consistent with end stage degenerative joint disease of the right knee(s) and total knee arthroplasty is deemed medically necessary. The treatment options including medical management, injection therapy arthroscopy and arthroplasty were discussed at length. The risks and benefits of total knee arthroplasty were presented and reviewed. The risks due to aseptic loosening, infection, stiffness, patella tracking problems, thromboembolic complications and other imponderables were discussed. The patient acknowledged the explanation, agreed to proceed with the plan and consent was signed. Patient is being admitted for inpatient treatment for  surgery, pain control, PT, OT, prophylactic antibiotics, VTE prophylaxis, progressive ambulation and ADL's and discharge planning. The patient is planning to be discharged home with home health services

## 2017-06-17 NOTE — Pre-Procedure Instructions (Addendum)
Spencer [name]  06/17/2017      [place], [address] [name] [address] Phone: [phone] Fax: [phone]    Your procedure is scheduled on Monday, June 28, 2017  Report to [facility] Admitting Entrance "A" at 5:30A.M.   Call this number if you have problems the morning of surgery:  [phone]   Remember:  Do not eat food or drink liquids after midnight.  Take these medicines the morning of surgery with A SIP OF WATER: If needed Acetaminophen (TYLENOL) for pain.  Follow your doctor's instruction regarding Aspirin.  7 days before your surgery (Nov. 19), stop taking all Aspirins, Vitamins, Fish oils, and Herbal medications. Also stop all NSAIDS i.e. Advil, Ibuprofen, Motrin, Aleve, Anaprox, Naproxen, BC and Goody Powders.   Do not wear jewelry.  Do not wear lotions, powders, colognes, or deodorant.  Do not shave 48 hours prior to surgery.  Men may shave face and neck.  Do not bring valuables to the hospital.  [facility] is not responsible for any belongings or valuables.  Contacts, dentures or bridgework may not be worn into surgery.  Leave your suitcase in the car.  After surgery it may be brought to your room.  For patients admitted to the hospital, discharge time will be determined by your treatment team.  Patients discharged the day of surgery will not be allowed to drive home.   Special instructions:   [place]- Preparing For Surgery  Before surgery, you can play an important role. Because skin is not sterile, your skin needs to be as free of germs as possible. You can reduce the number of germs on your skin by washing with CHG (chlorahexidine gluconate) Soap before surgery.  CHG is an antiseptic cleaner which kills germs and bonds with the skin to continue killing germs even after washing.  Please do not use if you have an allergy to CHG or antibacterial soaps. If your skin becomes  reddened/irritated stop using the CHG.  Do not shave (including legs and underarms) for at least 48 hours prior to first CHG shower. It is OK to shave your face.  Please follow these instructions carefully.   1. Shower the NIGHT BEFORE SURGERY and the MORNING OF SURGERY with CHG.   2. If you chose to wash your hair, wash your hair first as usual with your normal shampoo.  3. After you shampoo, rinse your hair and body thoroughly to remove the shampoo.  4. Use CHG as you would any other liquid soap. You can apply CHG directly to the skin and wash gently with a scrungie or a clean washcloth.   5. Apply the CHG Soap to your body ONLY FROM THE NECK DOWN.  Do not use on open wounds or open sores. Avoid contact with your eyes, ears, mouth and genitals (private parts). Wash Face and genitals (private parts)  with your normal soap.  6. Wash thoroughly, paying special attention to the area where your surgery will be performed.  7. Thoroughly rinse your body with warm water from the neck down.  8. DO NOT shower/wash with your normal soap after using and rinsing off the CHG Soap.  9. Pat yourself dry with a CLEAN TOWEL.  10. Wear CLEAN PAJAMAS to bed the night before surgery, wear comfortable clothes the morning of surgery  11. Place CLEAN SHEETS on your bed the night of your first shower and DO NOT  SLEEP WITH PETS.  Day of Surgery: Do not apply any deodorants/lotions. Please wear clean clothes to the hospital/surgery center.    Please read over the following fact sheets that you were given. Pain Booklet, Coughing and Deep Breathing, Total Joint Packet, MRSA Information and Surgical Site Infection Prevention

## 2017-06-18 ENCOUNTER — Encounter (HOSPITAL_COMMUNITY)
Admission: RE | Admit: 2017-06-18 | Discharge: 2017-06-18 | Disposition: A | Discharge status: Home / Self Care | Payer: Medicare Other | Source: Ambulatory Visit | Attending: Orthopedic Surgery | Admitting: Orthopedic Surgery

## 2017-06-18 ENCOUNTER — Other Ambulatory Visit: Payer: Self-pay

## 2017-06-18 ENCOUNTER — Encounter (HOSPITAL_COMMUNITY): Payer: Self-pay

## 2017-06-18 DIAGNOSIS — R9431 Abnormal electrocardiogram [ECG] [EKG]: Secondary | ICD-10-CM | POA: Insufficient documentation

## 2017-06-18 DIAGNOSIS — Z79899 Other long term (current) drug therapy: Secondary | ICD-10-CM | POA: Insufficient documentation

## 2017-06-18 DIAGNOSIS — Z825 Family history of asthma and other chronic lower respiratory diseases: Secondary | ICD-10-CM | POA: Insufficient documentation

## 2017-06-18 DIAGNOSIS — Z85828 Personal history of other malignant neoplasm of skin: Secondary | ICD-10-CM | POA: Insufficient documentation

## 2017-06-18 DIAGNOSIS — Z01812 Encounter for preprocedural laboratory examination: Secondary | ICD-10-CM | POA: Insufficient documentation

## 2017-06-18 DIAGNOSIS — Z87891 Personal history of nicotine dependence: Secondary | ICD-10-CM | POA: Insufficient documentation

## 2017-06-18 DIAGNOSIS — Z01818 Encounter for other preprocedural examination: Secondary | ICD-10-CM | POA: Insufficient documentation

## 2017-06-18 DIAGNOSIS — Z8249 Family history of ischemic heart disease and other diseases of the circulatory system: Secondary | ICD-10-CM | POA: Diagnosis not present

## 2017-06-18 DIAGNOSIS — M170 Bilateral primary osteoarthritis of knee: Secondary | ICD-10-CM | POA: Diagnosis not present

## 2017-06-18 DIAGNOSIS — Z0183 Encounter for blood typing: Secondary | ICD-10-CM | POA: Diagnosis not present

## 2017-06-18 DIAGNOSIS — K219 Gastro-esophageal reflux disease without esophagitis: Secondary | ICD-10-CM | POA: Insufficient documentation

## 2017-06-18 LAB — COMPREHENSIVE METABOLIC PANEL
ALK PHOS: 51 U/L (ref 38–126)
ALT: 35 U/L (ref 17–63)
AST: 32 U/L (ref 15–41)
Albumin: 3.9 g/dL (ref 3.5–5.0)
Anion gap: 7 (ref 5–15)
BUN: 11 mg/dL (ref 6–20)
CHLORIDE: 107 mmol/L (ref 101–111)
CO2: 22 mmol/L (ref 22–32)
CREATININE: 0.78 mg/dL (ref 0.61–1.24)
Calcium: 9.1 mg/dL (ref 8.9–10.3)
GFR calc Af Amer: >60 mL/min (ref >60)
Glucose, Bld: 101 mg/dL — ABNORMAL HIGH (ref 65–99)
Potassium: 3.9 mmol/L (ref 3.5–5.1)
SODIUM: 136 mmol/L (ref 135–145)
Total Bilirubin: 0.6 mg/dL (ref 0.3–1.2)
Total Protein: 7.3 g/dL (ref 6.5–8.1)

## 2017-06-18 LAB — CBC WITH DIFFERENTIAL/PLATELET
BASOS ABS: 0 10*3/uL (ref 0.0–0.1)
Basophils Relative: 0 %
EOS PCT: 1 %
Eosinophils Absolute: 0.1 10*3/uL (ref 0.0–0.7)
HCT: 47.7 % (ref 39.0–52.0)
HEMOGLOBIN: 16.7 g/dL (ref 13.0–17.0)
LYMPHS PCT: 32 %
Lymphs Abs: 2.3 10*3/uL (ref 0.7–4.0)
MCH: 33.1 pg (ref 26.0–34.0)
MCHC: 35 g/dL (ref 30.0–36.0)
MCV: 94.5 fL (ref 78.0–100.0)
Monocytes Absolute: 0.5 10*3/uL (ref 0.1–1.0)
Monocytes Relative: 8 %
NEUTROS PCT: 59 %
Neutro Abs: 4.2 10*3/uL (ref 1.7–7.7)
PLATELETS: 199 10*3/uL (ref 150–400)
RBC: 5.05 MIL/uL (ref 4.22–5.81)
RDW: 12 % (ref 11.5–15.5)
WBC: 7.1 10*3/uL (ref 4.0–10.5)

## 2017-06-18 LAB — SURGICAL PCR SCREEN
MRSA, PCR: NEGATIVE
STAPHYLOCOCCUS AUREUS: NEGATIVE

## 2017-06-18 LAB — PROTIME-INR
INR: 0.97
Prothrombin Time: 12.8 seconds (ref 11.4–15.2)

## 2017-06-18 LAB — TYPE AND SCREEN
ABO/RH(D): O POS
Antibody Screen: NEGATIVE

## 2017-06-18 LAB — APTT: APTT: 29 s (ref 24–36)

## 2017-06-18 NOTE — Progress Notes (Addendum)
PCP - Delman Cheadle, [facility]  Cardiologist - Denies  Chest x-ray - Denies  EKG - 06/18/17  Stress Test - Denies  ECHO - Denies  Cardiac Cath - Denies  Sleep Study - No CPAP - None  Pt sts that [name], PA for Dr. Noemi Chapel told him that he could continue taking the Aspirin 81mg  daily. Pt sts he was never prescribed the medicine, he has just been taking it since the 1980's, so he will not take it the day of surgery.     Pt denies having chest pain, sob, or fever at this time. All instructions explained to the pt, with a verbal understanding of the material. Pt agrees to go over the instructions while at home for a better understanding. The opportunity to ask questions was provided.

## 2017-06-19 LAB — URINE CULTURE

## 2017-06-22 ENCOUNTER — Encounter: Payer: Self-pay | Admitting: Internal Medicine

## 2017-06-22 ENCOUNTER — Ambulatory Visit: Payer: Medicare Other | Admitting: Internal Medicine

## 2017-06-22 VITALS — BP 154/101 | HR 108 | Temp 99.0°F | Ht 72.0 in | Wt 181.4 lb

## 2017-06-22 DIAGNOSIS — K219 Gastro-esophageal reflux disease without esophagitis: Secondary | ICD-10-CM

## 2017-06-22 DIAGNOSIS — K5289 Other specified noninfective gastroenteritis and colitis: Secondary | ICD-10-CM

## 2017-06-22 NOTE — Progress Notes (Signed)
Primary Care Physician:  [facility] Primary Gastroenterologist:  Dr. Gala Romney  Pre-Procedure History & Physical: HPI:  Spencer [name] is a 71 y.o. male here for follow-up. Had some rectal bleeding which was self limiting. We did a colonoscopy which revealed some patchy colitis -  biopsies nonspecific. By the time he showed up for colonoscopy his diarrhea had resolved. He states she's back to baseline having 1-3 formed bowel movements daily no bleeding or abdominal pain. Reflux symptoms well controlled on omeprazole 20 mg daily.   Past Medical History:  Diagnosis Date  . 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 Fracture    ribs/ right wrist x3/ left ankle/cracked vertebrae-cervical/  . GERD (gastroesophageal reflux disease)   . Primary localized osteoarthritis of left knee   . Primary localized osteoarthritis of right knee     Past Surgical History:  Procedure Laterality Date  . BASAL CELL CARCINOMA EXCISION    . COLONOSCOPY  07/04/2004   RMR: Diminutive rectal polyps, removed with cold biopsy forceps, otherwise normal/  Minimal left-sided diverticula, the remainder of the colonic mucosa normal. Hyerplastic  . COLONOSCOPY N/A 03/27/2013   Dr.Maranatha Grossi-prominent internal hemorrhoids o/w normal rectum. scattered sigmoid diverticula with associated areas of submucosal petechiae. two 52mm diminutive polypoid lesions opposite the ileocecal valve the remainder of the colonic mucosa appeared normal. bx= tubular adenoma, benign hyperemic colorectal mucosa with focal active colitis  . COLONOSCOPY N/A 05/26/2017   Procedure: COLONOSCOPY;  Surgeon: [name], MD;  Location: [facility];  Service: Endoscopy;  Laterality: N/A;  7:30am  . FOOT SURGERY Left    X 2-otif  . FOOT SURGERY Right 2003  . HEMORRHOID BANDING    . KNEE SURGERY Left 1973   open  . OLECRANON BURSECTOMY Right 03/29/2014   Procedure: EXCISION OF RIGHT OLECRANON BURSA;  Surgeon:  [name], MD;  Location: [facility] ORS;  Service: Orthopedics;  Laterality: Right;  . TOTAL KNEE ARTHROPLASTY Left 06/29/2016   Procedure: LEFT TOTAL KNEE ARTHROPLASTY;  Surgeon: [name], MD;  Location: [facility];  Service: Orthopedics;  Laterality: Left;    Prior to Admission medications   Medication Sig Start Date End Date Taking? Authorizing Provider  acetaminophen (TYLENOL) 500 MG tablet Take 1,000 mg every 6 (six) hours as needed by mouth for moderate pain or headache.   Yes [provider]  aspirin 81 MG chewable tablet Chew 81 mg by mouth every evening.    Yes [provider]  Emollient (VASELINE INTENSIVE CARE EX) Apply 1 application at bedtime topically.    Yes [provider]  famotidine (PEPCID AC) 10 MG chewable tablet Chew 10 mg by mouth at bedtime as needed for heartburn.    Yes [provider]  Melatonin 3 MG TABS Take 3 mg by mouth at bedtime.   Yes [provider]  Multiple Vitamin (MULTIVITAMIN WITH MINERALS) TABS tablet Take 1 tablet by mouth daily.   Yes [provider]  Omega-3 Fatty Acids (FISH OIL) 1200 MG CAPS Take 1,200 mg by mouth daily with supper.   Yes [provider]  omeprazole (PRILOSEC OTC) 20 MG tablet Take 20 mg by mouth daily as needed (for late night snacking).   Yes [provider]  OVER THE COUNTER MEDICATION Take 4 capsules daily after supper by mouth. Fero Vital Supplement   Yes [provider]  Prasterone, DHEA, (DHEA 50 PO) Take 50 mg daily by mouth.  Yes [provider]  Pseudoeph-Doxylamine-DM-APAP (NYQUIL PO) Take 1 Dose at bedtime as needed by mouth (for cold symptoms).    Yes [provider]  polyethylene glycol-electrolytes (TRILYTE) 420 g solution Take 4,000 mLs by mouth as directed. Patient not taking: Reported on 06/22/2017 05/04/17   [name], MD    Allergies as of 06/22/2017  . (No Known Allergies)    Family History  Problem  Relation Age of Onset  . Heart failure Mother   . COPD Sister   . Colon cancer Neg Hx     Social History   Socioeconomic History  . Marital status: Married    Spouse name: Not on file  . Number of children: Not on file  . Years of education: Not on file  . Highest education level: Not on file  Social Needs  . Financial resource strain: Not on file  . Food insecurity - worry: Not on file  . Food insecurity - inability: Not on file  . Transportation needs - medical: Not on file  . Transportation needs - non-medical: Not on file  Occupational History  . Occupation: retired    Comment: Scientist, physiological of Students at [employer]  . Smoking status: Former Smoker    Packs/day: 0.50    Years: 5.00    Pack years: 2.50    Types: Cigarettes    Last attempt to quit: 03/27/1975    Years since quitting: 42.2  . Smokeless tobacco: Former Systems developer    Quit date: 08/03/1978  Substance and Sexual Activity  . Alcohol use: Yes    Comment: beer daily 2-3  . Drug use: No  . Sexual activity: Yes    Birth control/protection: None  Other Topics Concern  . Not on file  Social History Narrative  . Not on file    Review of Systems: See HPI, otherwise negative ROS  Physical Exam: BP (!) 154/101   Pulse (!) 108   Temp 99 F (37.2 C) (Oral)   Ht 6' (1.829 m)   Wt 181 lb 6.4 oz (82.3 kg)   BMI 24.60 kg/m  General:   Alert,   pleasant and cooperative in NAD SLungs:  Clear throughout to auscultation.   No wheezes, crackles, or rhonchi. No acute distress. Heart:  Regular rate and rhythm; no murmurs, clicks, rubs,  or gallops. Abdomen: Non-distended, normal bowel sounds.  Soft and nontender without appreciable mass or hepatosplenomegaly.  Pulses:  Normal pulses noted. Extremities:  Without clubbing or edema.  Impression:  Pleasant 71 year old gentleman recent rectal bleeding. Patchy colitis seen on the colonoscopy clinically, resolved. Etiology not totally clear. We may be dealing with stuttering front  end of inflammatory bowel disease. He is adamant about his symptoms returning to baseline without any specific therapy. GERD symptoms well controlled on omeprazole.  Recommendations:  Avoid NSAIDs like Aleve and Advil, etc; Tylenol OK  May or may not need further evaluation of colitis  Repeat colonoscopy in 5 years  Office visit in 6 months    Notice: This dictation was prepared with Dragon dictation along with smaller phrase technology. Any transcriptional errors that result from this process are unintentional and may not be corrected upon review.

## 2017-06-22 NOTE — Patient Instructions (Signed)
Avoid NSAIDs like Aleve and Advil, etc; Tylenol OK  May or may not need further evaluation of colitis  Repeat colonoscopy in 5 years  Office visit in 6 months

## 2017-06-25 MED ORDER — CEFAZOLIN SODIUM-DEXTROSE 2-4 GM/100ML-% IV SOLN
2.0000 g | INTRAVENOUS | Status: AC
  Administered 2017-06-28: 2 g via INTRAVENOUS
  Filled 2017-06-25: qty 100

## 2017-06-25 MED ORDER — LACTATED RINGERS IV SOLN: INTRAVENOUS | Status: DC

## 2017-06-25 MED ORDER — TRANEXAMIC ACID 1000 MG/10ML IV SOLN
1000.0000 mg | INTRAVENOUS | Status: AC
  Administered 2017-06-28: 1000 mg via INTRAVENOUS
  Filled 2017-06-25: qty 10

## 2017-06-27 NOTE — Anesthesia Preprocedure Evaluation (Addendum)
Anesthesia Evaluation  Patient identified by MRN, date of birth, ID band Patient awake    Reviewed: Allergy & Precautions, H&P , NPO status , Patient's Chart, lab work & pertinent test results  Airway Mallampati: II  TM Distance: >3 FB Neck ROM: Full    Dental  (+) Dental Advisory Given, Teeth Intact   Pulmonary neg sleep apnea, neg COPD, former smoker,    Pulmonary exam normal breath sounds clear to auscultation       Cardiovascular Exercise Tolerance: Good (-) Past MI negative cardio ROS Normal cardiovascular exam Rhythm:Regular Rate:Normal     Neuro/Psych negative neurological ROS  negative psych ROS   GI/Hepatic Neg liver ROS, GERD  Controlled and Medicated,  Endo/Other  negative endocrine ROS  Renal/GU negative Renal ROS  negative genitourinary   Musculoskeletal  (+) Arthritis ,   Abdominal   Peds  Hematology negative hematology ROS (+)   Anesthesia Other Findings Hx SCC and Basal cell ca of skin  Reproductive/Obstetrics                           Anesthesia Physical Anesthesia Plan  ASA: II  Anesthesia Plan: Spinal   Post-op Pain Management:  Regional for Post-op pain   Induction:   PONV Risk Score and Plan: 1 and Treatment may vary due to age or medical condition and Propofol infusion  Airway Management Planned: Natural Airway and Nasal Cannula  Additional Equipment: None  Intra-op Plan:   Post-operative Plan:   Informed Consent: I have reviewed the patients History and Physical, chart, labs and discussed the procedure including the risks, benefits and alternatives for the proposed anesthesia with the patient or authorized representative who has indicated his/her understanding and acceptance.   Dental advisory given  Plan Discussed with: CRNA  Anesthesia Plan Comments: (Plan for spinal with 0.75% isobaric bupivacaine)     Anesthesia Quick Evaluation

## 2017-06-27 NOTE — Anesthesia Preprocedure Evaluation (Deleted)
Anesthesia Evaluation  Patient identified by MRN, date of birth, ID band Patient awake    Reviewed: Allergy & Precautions, H&P , NPO status , Patient's Chart, lab work & pertinent test results  Airway Mallampati: II   Neck ROM: full    Dental  (+) Teeth Intact, Dental Advisory Given   Pulmonary former smoker,    breath sounds clear to auscultation       Cardiovascular negative cardio ROS   Rhythm:regular Rate:Normal     Neuro/Psych negative neurological ROS  negative psych ROS   GI/Hepatic Neg liver ROS, GERD  Medicated and Controlled,  Endo/Other  negative endocrine ROS  Renal/GU negative Renal ROS  negative genitourinary   Musculoskeletal  (+) Arthritis ,   Abdominal   Peds  Hematology negative hematology ROS (+)   Anesthesia Other Findings   Reproductive/Obstetrics                             Anesthesia Physical  Anesthesia Plan  ASA: II  Anesthesia Plan:    Post-op Pain Management:  Regional for Post-op pain   Induction: Intravenous  PONV Risk Score and Plan: Treatment may vary due to age or medical condition  Airway Management Planned: Simple Face Mask  Additional Equipment: None  Intra-op Plan:   Post-operative Plan:   Informed Consent: I have reviewed the patients History and Physical, chart, labs and discussed the procedure including the risks, benefits and alternatives for the proposed anesthesia with the patient or authorized representative who has indicated his/her understanding and acceptance.   Dental advisory given  Plan Discussed with: CRNA, Anesthesiologist and Surgeon  Anesthesia Plan Comments:         Anesthesia Quick Evaluation

## 2017-06-28 ENCOUNTER — Encounter (HOSPITAL_COMMUNITY)
Admission: RE | Disposition: A | Discharge status: Home Health | Payer: Self-pay | Source: Ambulatory Visit | Attending: Orthopedic Surgery

## 2017-06-28 ENCOUNTER — Inpatient Hospital Stay (HOSPITAL_COMMUNITY)
Admission: RE | Admit: 2017-06-28 | Discharge: 2017-06-30 | DRG: 470 | Disposition: A | Discharge status: Home Health | Payer: Medicare Other | Source: Ambulatory Visit | Attending: Orthopedic Surgery | Admitting: Orthopedic Surgery

## 2017-06-28 ENCOUNTER — Inpatient Hospital Stay (HOSPITAL_COMMUNITY): Payer: Medicare Other | Admitting: Certified Registered Nurse Anesthetist

## 2017-06-28 ENCOUNTER — Encounter (HOSPITAL_COMMUNITY): Payer: Self-pay | Admitting: *Deleted

## 2017-06-28 DIAGNOSIS — Z85828 Personal history of other malignant neoplasm of skin: Secondary | ICD-10-CM

## 2017-06-28 DIAGNOSIS — Z87891 Personal history of nicotine dependence: Secondary | ICD-10-CM

## 2017-06-28 DIAGNOSIS — K219 Gastro-esophageal reflux disease without esophagitis: Secondary | ICD-10-CM | POA: Diagnosis present

## 2017-06-28 DIAGNOSIS — M1711 Unilateral primary osteoarthritis, right knee: Principal | ICD-10-CM | POA: Diagnosis present

## 2017-06-28 HISTORY — DX: Unilateral primary osteoarthritis, right knee: M17.11

## 2017-06-28 HISTORY — PX: TOTAL KNEE ARTHROPLASTY: SHX125

## 2017-06-28 SURGERY — ARTHROPLASTY, KNEE, TOTAL
Anesthesia: Spinal | Site: Knee | Laterality: Right

## 2017-06-28 MED ORDER — BUPIVACAINE-EPINEPHRINE (PF) 0.25% -1:200000 IJ SOLN
INTRAMUSCULAR | Status: AC
  Filled 2017-06-28: qty 30

## 2017-06-28 MED ORDER — ONDANSETRON HCL 4 MG/2ML IJ SOLN
INTRAMUSCULAR | Status: DC | PRN
  Administered 2017-06-28: 4 mg via INTRAVENOUS

## 2017-06-28 MED ORDER — SODIUM CHLORIDE 0.9 % IR SOLN
Status: DC | PRN
  Administered 2017-06-28: 3000 mL

## 2017-06-28 MED ORDER — DIPHENHYDRAMINE HCL 12.5 MG/5ML PO ELIX: 12.5000 mg | ORAL_SOLUTION | ORAL | Status: DC | PRN

## 2017-06-28 MED ORDER — CEFAZOLIN SODIUM-DEXTROSE 2-4 GM/100ML-% IV SOLN
2.0000 g | Freq: Four times a day (QID) | INTRAVENOUS | Status: AC
  Administered 2017-06-28 (×2): 2 g via INTRAVENOUS
  Filled 2017-06-28 (×2): qty 100

## 2017-06-28 MED ORDER — ONDANSETRON HCL 4 MG PO TABS: 4.0000 mg | ORAL_TABLET | Freq: Four times a day (QID) | ORAL | Status: DC | PRN

## 2017-06-28 MED ORDER — DEXAMETHASONE SODIUM PHOSPHATE 10 MG/ML IJ SOLN
10.0000 mg | Freq: Three times a day (TID) | INTRAMUSCULAR | Status: AC
  Administered 2017-06-28 – 2017-06-29 (×4): 10 mg via INTRAVENOUS
  Filled 2017-06-28 (×4): qty 1

## 2017-06-28 MED ORDER — DEXAMETHASONE SODIUM PHOSPHATE 10 MG/ML IJ SOLN
10.0000 mg | Freq: Once | INTRAMUSCULAR | Status: AC
  Administered 2017-06-28: 10 mg via INTRAVENOUS
  Filled 2017-06-28: qty 1

## 2017-06-28 MED ORDER — BUPIVACAINE IN DEXTROSE 0.75-8.25 % IT SOLN
INTRATHECAL | Status: DC | PRN
  Administered 2017-06-28: 1.8 mL via INTRATHECAL

## 2017-06-28 MED ORDER — OXYCODONE HCL 5 MG PO TABS
10.0000 mg | ORAL_TABLET | ORAL | Status: DC | PRN
  Administered 2017-06-28 – 2017-06-30 (×12): 10 mg via ORAL
  Filled 2017-06-28 (×12): qty 2

## 2017-06-28 MED ORDER — PHENOL 1.4 % MT LIQD: 1.0000 | OROMUCOSAL | Status: DC | PRN

## 2017-06-28 MED ORDER — MIDAZOLAM HCL 5 MG/5ML IJ SOLN
INTRAMUSCULAR | Status: DC | PRN
  Administered 2017-06-28: 2 mg via INTRAVENOUS

## 2017-06-28 MED ORDER — METOCLOPRAMIDE HCL 5 MG/ML IJ SOLN: 5.0000 mg | Freq: Three times a day (TID) | INTRAMUSCULAR | Status: DC | PRN

## 2017-06-28 MED ORDER — DOCUSATE SODIUM 100 MG PO CAPS
100.0000 mg | ORAL_CAPSULE | Freq: Two times a day (BID) | ORAL | Status: DC
  Administered 2017-06-28 – 2017-06-30 (×5): 100 mg via ORAL
  Filled 2017-06-28 (×5): qty 1

## 2017-06-28 MED ORDER — PROPOFOL 500 MG/50ML IV EMUL
INTRAVENOUS | Status: DC | PRN
  Administered 2017-06-28: 100 ug/kg/min via INTRAVENOUS

## 2017-06-28 MED ORDER — METOCLOPRAMIDE HCL 5 MG PO TABS: 5.0000 mg | ORAL_TABLET | Freq: Three times a day (TID) | ORAL | Status: DC | PRN

## 2017-06-28 MED ORDER — ONDANSETRON HCL 4 MG/2ML IJ SOLN: 4.0000 mg | Freq: Four times a day (QID) | INTRAMUSCULAR | Status: DC | PRN

## 2017-06-28 MED ORDER — MIDAZOLAM HCL 2 MG/2ML IJ SOLN
INTRAMUSCULAR | Status: AC
  Filled 2017-06-28: qty 2

## 2017-06-28 MED ORDER — FENTANYL CITRATE (PF) 100 MCG/2ML IJ SOLN
INTRAMUSCULAR | Status: DC | PRN
  Administered 2017-06-28 (×2): 50 ug via INTRAVENOUS

## 2017-06-28 MED ORDER — BUPIVACAINE-EPINEPHRINE 0.25% -1:200000 IJ SOLN
INTRAMUSCULAR | Status: DC | PRN
Start: 2017-06-28 — End: 2017-06-28
  Administered 2017-06-28: 30 mL

## 2017-06-28 MED ORDER — OXYCODONE HCL 5 MG PO TABS: 5.0000 mg | ORAL_TABLET | ORAL | Status: DC | PRN

## 2017-06-28 MED ORDER — PROPOFOL 10 MG/ML IV BOLUS
INTRAVENOUS | Status: AC
  Filled 2017-06-28: qty 20

## 2017-06-28 MED ORDER — OMEPRAZOLE MAGNESIUM 20 MG PO TBEC: 20.0000 mg | DELAYED_RELEASE_TABLET | Freq: Two times a day (BID) | ORAL | Status: DC

## 2017-06-28 MED ORDER — HYDROMORPHONE HCL 1 MG/ML IJ SOLN
0.5000 mg | INTRAMUSCULAR | Status: DC | PRN
  Administered 2017-06-28: 0.5 mg via INTRAVENOUS
  Filled 2017-06-28: qty 1

## 2017-06-28 MED ORDER — FAMOTIDINE 20 MG PO TABS
10.0000 mg | ORAL_TABLET | Freq: Every day | ORAL | Status: DC
  Administered 2017-06-28 – 2017-06-29 (×2): 10 mg via ORAL
  Filled 2017-06-28 (×2): qty 1

## 2017-06-28 MED ORDER — 0.9 % SODIUM CHLORIDE (POUR BTL) OPTIME
TOPICAL | Status: DC | PRN
  Administered 2017-06-28: 1000 mL

## 2017-06-28 MED ORDER — LACTATED RINGERS IV SOLN
INTRAVENOUS | Status: DC | PRN
  Administered 2017-06-28: 07:00:00 via INTRAVENOUS

## 2017-06-28 MED ORDER — ACETAMINOPHEN 650 MG RE SUPP: 650.0000 mg | RECTAL | Status: DC | PRN

## 2017-06-28 MED ORDER — ASPIRIN EC 325 MG PO TBEC
325.0000 mg | DELAYED_RELEASE_TABLET | Freq: Every day | ORAL | Status: DC
  Administered 2017-06-29 – 2017-06-30 (×2): 325 mg via ORAL
  Filled 2017-06-28 (×2): qty 1

## 2017-06-28 MED ORDER — ALUM & MAG HYDROXIDE-SIMETH 200-200-20 MG/5ML PO SUSP: 30.0000 mL | ORAL | Status: DC | PRN

## 2017-06-28 MED ORDER — ACETAMINOPHEN 325 MG PO TABS: 650.0000 mg | ORAL_TABLET | ORAL | Status: DC | PRN

## 2017-06-28 MED ORDER — POTASSIUM CHLORIDE IN NACL 20-0.9 MEQ/L-% IV SOLN
INTRAVENOUS | Status: DC
  Administered 2017-06-28 (×2): via INTRAVENOUS
  Filled 2017-06-28 (×2): qty 1000

## 2017-06-28 MED ORDER — ONDANSETRON HCL 4 MG/2ML IJ SOLN
INTRAMUSCULAR | Status: AC
  Filled 2017-06-28: qty 2

## 2017-06-28 MED ORDER — CHLORHEXIDINE GLUCONATE 4 % EX LIQD: 60.0000 mL | Freq: Once | CUTANEOUS | Status: DC

## 2017-06-28 MED ORDER — POLYETHYLENE GLYCOL 3350 17 G PO PACK
17.0000 g | PACK | Freq: Two times a day (BID) | ORAL | Status: DC
  Administered 2017-06-28 – 2017-06-30 (×5): 17 g via ORAL
  Filled 2017-06-28 (×5): qty 1

## 2017-06-28 MED ORDER — FAMOTIDINE 10 MG PO CHEW: 10.0000 mg | CHEWABLE_TABLET | Freq: Every day | ORAL | Status: DC

## 2017-06-28 MED ORDER — MENTHOL 3 MG MT LOZG: 1.0000 | LOZENGE | OROMUCOSAL | Status: DC | PRN

## 2017-06-28 MED ORDER — ONDANSETRON HCL 4 MG/2ML IJ SOLN: 4.0000 mg | Freq: Once | INTRAMUSCULAR | Status: DC | PRN

## 2017-06-28 MED ORDER — MELATONIN 3 MG PO TABS
3.0000 mg | ORAL_TABLET | Freq: Every day | ORAL | Status: DC
  Administered 2017-06-29: 3 mg via ORAL
  Filled 2017-06-28 (×2): qty 1

## 2017-06-28 MED ORDER — FENTANYL CITRATE (PF) 250 MCG/5ML IJ SOLN
INTRAMUSCULAR | Status: AC
  Filled 2017-06-28: qty 5

## 2017-06-28 MED ORDER — FENTANYL CITRATE (PF) 100 MCG/2ML IJ SOLN: 25.0000 ug | INTRAMUSCULAR | Status: DC | PRN

## 2017-06-28 MED ORDER — PHENYLEPHRINE HCL 10 MG/ML IJ SOLN
INTRAMUSCULAR | Status: DC | PRN
  Administered 2017-06-28: 25 ug/min via INTRAVENOUS

## 2017-06-28 MED ORDER — DEXAMETHASONE SODIUM PHOSPHATE 10 MG/ML IJ SOLN
INTRAMUSCULAR | Status: AC
  Filled 2017-06-28: qty 1

## 2017-06-28 MED ORDER — BUPIVACAINE-EPINEPHRINE (PF) 0.5% -1:200000 IJ SOLN
INTRAMUSCULAR | Status: DC | PRN
  Administered 2017-06-28: 30 mL via PERINEURAL

## 2017-06-28 MED ORDER — PANTOPRAZOLE SODIUM 40 MG PO TBEC
40.0000 mg | DELAYED_RELEASE_TABLET | Freq: Two times a day (BID) | ORAL | Status: DC
  Administered 2017-06-28 – 2017-06-30 (×4): 40 mg via ORAL
  Filled 2017-06-28 (×4): qty 1

## 2017-06-28 MED ORDER — ACETAMINOPHEN 500 MG PO TABS
1000.0000 mg | ORAL_TABLET | Freq: Four times a day (QID) | ORAL | Status: AC
  Administered 2017-06-28 – 2017-06-29 (×3): 1000 mg via ORAL
  Filled 2017-06-28 (×3): qty 2

## 2017-06-28 MED ORDER — POVIDONE-IODINE 7.5 % EX SOLN
Freq: Once | CUTANEOUS | Status: DC
  Filled 2017-06-28: qty 118

## 2017-06-28 SURGICAL SUPPLY — 74 items
APL SKNCLS STERI-STRIP NONHPOA (GAUZE/BANDAGES/DRESSINGS) ×1
BANDAGE ESMARK 6X9 LF (GAUZE/BANDAGES/DRESSINGS) ×1 IMPLANT
BENZOIN TINCTURE PRP APPL 2/3 (GAUZE/BANDAGES/DRESSINGS) ×3 IMPLANT
BLADE SAGITTAL 25.0X1.19X90 (BLADE) ×2 IMPLANT
BLADE SAGITTAL 25.0X1.19X90MM (BLADE) ×1
BLADE SAW SGTL 13X75X1.27 (BLADE) ×3 IMPLANT
BLADE SURG 10 STRL SS (BLADE) ×10 IMPLANT
BNDG CMPR 9X6 STRL LF SNTH (GAUZE/BANDAGES/DRESSINGS) ×1
BNDG CMPR MED 15X6 ELC VLCR LF (GAUZE/BANDAGES/DRESSINGS) ×1
BNDG ELASTIC 6X15 VLCR STRL LF (GAUZE/BANDAGES/DRESSINGS) ×3 IMPLANT
BNDG ESMARK 6X9 LF (GAUZE/BANDAGES/DRESSINGS) ×3
BOWL SMART MIX CTS (DISPOSABLE) ×3 IMPLANT
CAPT KNEE TOTAL 3 ATTUNE ×2 IMPLANT
CEMENT HV SMART SET (Cement) ×6 IMPLANT
CLOSURE WOUND 1/2 X4 (GAUZE/BANDAGES/DRESSINGS) ×1
COVER SURGICAL LIGHT HANDLE (MISCELLANEOUS) ×3 IMPLANT
CUFF TOURNIQUET SINGLE 34IN LL (TOURNIQUET CUFF) ×3 IMPLANT
CUFF TOURNIQUET SINGLE 44IN (TOURNIQUET CUFF) IMPLANT
DECANTER SPIKE VIAL GLASS SM (MISCELLANEOUS) ×1 IMPLANT
DRAPE EXTREMITY T 121X128X90 (DRAPE) ×3 IMPLANT
DRAPE HALF SHEET 40X57 (DRAPES) ×6 IMPLANT
DRAPE INCISE IOBAN 66X45 STRL (DRAPES) ×2 IMPLANT
DRAPE ORTHO SPLIT 77X108 STRL (DRAPES) ×3
DRAPE SURG ORHT 6 SPLT 77X108 (DRAPES) ×1 IMPLANT
DRAPE U-SHAPE 47X51 STRL (DRAPES) ×3 IMPLANT
DRSG AQUACEL AG ADV 3.5X14 (GAUZE/BANDAGES/DRESSINGS) ×3 IMPLANT
DURAPREP 26ML APPLICATOR (WOUND CARE) ×6 IMPLANT
ELECT CAUTERY BLADE 6.4 (BLADE) ×3 IMPLANT
ELECT REM PT RETURN 9FT ADLT (ELECTROSURGICAL) ×3
ELECTRODE REM PT RTRN 9FT ADLT (ELECTROSURGICAL) ×1 IMPLANT
FACESHIELD WRAPAROUND (MASK) ×3 IMPLANT
FACESHIELD WRAPAROUND OR TEAM (MASK) ×1 IMPLANT
GLOVE BIO SURGEON STRL SZ7 (GLOVE) ×3 IMPLANT
GLOVE BIOGEL PI IND STRL 7.0 (GLOVE) ×1 IMPLANT
GLOVE BIOGEL PI IND STRL 7.5 (GLOVE) ×1 IMPLANT
GLOVE BIOGEL PI INDICATOR 7.0 (GLOVE) ×2
GLOVE BIOGEL PI INDICATOR 7.5 (GLOVE) ×2
GLOVE SS BIOGEL STRL SZ 7.5 (GLOVE) ×1 IMPLANT
GLOVE SUPERSENSE BIOGEL SZ 7.5 (GLOVE) ×2
GOWN STRL REUS W/ TWL LRG LVL3 (GOWN DISPOSABLE) ×1 IMPLANT
GOWN STRL REUS W/ TWL XL LVL3 (GOWN DISPOSABLE) ×2 IMPLANT
GOWN STRL REUS W/TWL LRG LVL3 (GOWN DISPOSABLE) ×3
GOWN STRL REUS W/TWL XL LVL3 (GOWN DISPOSABLE) ×6
HANDPIECE INTERPULSE COAX TIP (DISPOSABLE) ×3
HOOD PEEL AWAY FACE SHEILD DIS (HOOD) ×6 IMPLANT
IMMOBILIZER KNEE 22 UNIV (SOFTGOODS) ×3 IMPLANT
KIT BASIN OR (CUSTOM PROCEDURE TRAY) ×3 IMPLANT
KIT ROOM TURNOVER OR (KITS) ×3 IMPLANT
MANIFOLD NEPTUNE II (INSTRUMENTS) ×3 IMPLANT
MARKER SKIN DUAL TIP RULER LAB (MISCELLANEOUS) ×3 IMPLANT
NDL 18GX1X1/2 (RX/OR ONLY) (NEEDLE) ×1 IMPLANT
NEEDLE 18GX1X1/2 (RX/OR ONLY) (NEEDLE) ×3 IMPLANT
NS IRRIG 1000ML POUR BTL (IV SOLUTION) ×3 IMPLANT
PACK TOTAL JOINT (CUSTOM PROCEDURE TRAY) ×3 IMPLANT
PAD ARMBOARD 7.5X6 YLW CONV (MISCELLANEOUS) ×6 IMPLANT
SET HNDPC FAN SPRY TIP SCT (DISPOSABLE) ×1 IMPLANT
STRIP CLOSURE SKIN 1/2X4 (GAUZE/BANDAGES/DRESSINGS) ×2 IMPLANT
SUCTION FRAZIER HANDLE 10FR (MISCELLANEOUS) ×2
SUCTION TUBE FRAZIER 10FR DISP (MISCELLANEOUS) ×1 IMPLANT
SUT MNCRL AB 3-0 PS2 18 (SUTURE) ×3 IMPLANT
SUT VIC AB 0 CT1 27 (SUTURE) ×6
SUT VIC AB 0 CT1 27XBRD ANBCTR (SUTURE) ×2 IMPLANT
SUT VIC AB 1 CT1 27 (SUTURE) ×3
SUT VIC AB 1 CT1 27XBRD ANBCTR (SUTURE) ×1 IMPLANT
SUT VIC AB 2-0 CT1 27 (SUTURE) ×6
SUT VIC AB 2-0 CT1 TAPERPNT 27 (SUTURE) ×2 IMPLANT
SYR 30ML LL (SYRINGE) ×3 IMPLANT
TOWEL OR 17X24 6PK STRL BLUE (TOWEL DISPOSABLE) ×3 IMPLANT
TOWEL OR 17X26 10 PK STRL BLUE (TOWEL DISPOSABLE) ×3 IMPLANT
TRAY CATH 16FR W/PLASTIC CATH (SET/KITS/TRAYS/PACK) IMPLANT
TRAY FOLEY CATH SILVER 16FR (SET/KITS/TRAYS/PACK) ×3 IMPLANT
TUBE CONNECTING 12'X1/4 (SUCTIONS) ×1
TUBE CONNECTING 12X1/4 (SUCTIONS) ×2 IMPLANT
YANKAUER SUCT BULB TIP NO VENT (SUCTIONS) ×3 IMPLANT

## 2017-06-28 NOTE — Evaluation (Signed)
Occupational Therapy Evaluation Patient Details Name: Spencer [name] MRN: [id number] DOB: 02-04-1946 Today's Date: 06/28/2017    History of Present Illness 71 yo male s/p R TKA. PHM including cancer and prior Bil TKA.   Clinical Impression   PTA, pt was living with his wife and was independent. Currently, pt requires Min Guard A for LB ADLs and functional mobility using RW. Provided education on LB ADLs and toilet transfer; pt demonstrated and verbalized understanding. Pt would benefit from further acute OT to facilitate safe dc and address toilet and tub transfers. Recommend dc home once medically stable per physician.     Follow Up Recommendations  No OT follow up;Supervision/Assistance - 24 hour    Equipment Recommendations  None recommended by OT    Recommendations for Other Services PT consult     Precautions / Restrictions Precautions Precautions: Fall;Knee Precaution Comments: Educated pt on testing with knee in extension Restrictions Weight Bearing Restrictions: Yes RLE Weight Bearing: Weight bearing as tolerated      Mobility Bed Mobility Overal bed mobility: Needs Assistance Bed Mobility: Supine to Sit     Supine to sit: Supervision;HOB elevated     General bed mobility comments: Increased time and elevated HOB  Transfers Overall transfer level: Needs assistance Equipment used: Rolling walker (2 wheeled) Transfers: Sit to/from Stand Sit to Stand: Min guard         General transfer comment: Geophysicist/field seismologist for safety    Balance                                           ADL either performed or assessed with clinical judgement   ADL Overall ADL's : Needs assistance/impaired Eating/Feeding: Set up;Sitting   Grooming: Set up;Sitting   Upper Body Bathing: Set up;Sitting   Lower Body Bathing: Min guard;Sit to/from stand   Upper Body Dressing : Set up;Sitting   Lower Body Dressing: Min guard;Sit to/from stand Lower Body Dressing  Details (indicate cue type and reason): Provided education for LB dressing. Pt donning socks at EOB. Min guard A for safety in standing. Toilet Transfer: Min guard;Ambulation;RW(Simulated to recliner)           Functional mobility during ADLs: Min guard;Rolling walker General ADL Comments: Pt performing LB ADLs and funcitonal mobility at [facility] level. Providing education on LB ADLs and toilet transfers. Feel pt will progress well with time.      Vision         Perception     Praxis      Pertinent Vitals/Pain Pain Assessment: Faces Faces Pain Scale: Hurts little more Pain Location: R knee Pain Descriptors / Indicators: Constant;Discomfort;Grimacing Pain Intervention(s): Monitored during session;RN gave pain meds during session;Repositioned     Hand Dominance Right   Extremity/Trunk Assessment Upper Extremity Assessment Upper Extremity Assessment: Overall WFL for tasks assessed   Lower Extremity Assessment Lower Extremity Assessment: Defer to PT evaluation;RLE deficits/detail RLE Deficits / Details: s/p R TKA   Cervical / Trunk Assessment Cervical / Trunk Assessment: Normal   Communication Communication Communication: No difficulties   Cognition Arousal/Alertness: Awake/alert Behavior During Therapy: WFL for tasks assessed/performed Overall Cognitive Status: Within Functional Limits for tasks assessed  General Comments  Wife present throughout    Exercises     Shoulder Instructions      Independence expects to be discharged to:: Private residence Living Arrangements: Spouse/significant other Available Help at Discharge: Family;Available 24 hours/day Type of Home: House Home Access: Stairs to enter [employer] of Steps: 4 Entrance Stairs-Rails: Right;Left Home Layout: One level     Bathroom Shower/Tub: Teacher, early years/pre: Handicapped height     Home  Equipment: Environmental consultant - 2 wheels;Tub bench          Prior Functioning/Environment Level of Independence: Independent                 OT Problem List: Decreased strength;Decreased activity tolerance;Impaired balance (sitting and/or standing);Decreased knowledge of use of DME or AE;Decreased knowledge of precautions;Pain      OT Treatment/Interventions: Self-care/ADL training;Therapeutic exercise;Energy conservation;DME and/or AE instruction;Therapeutic activities;Patient/family education    OT Goals(Current goals can be found in the care plan section) Acute Rehab OT Goals Patient Stated Goal: Go home OT Goal Formulation: With patient Time For Goal Achievement: 07/12/17 Potential to Achieve Goals: Good ADL Goals Pt Will Perform Lower Body Dressing: with modified independence;sit to/from stand Pt Will Transfer to Toilet: with modified independence;regular height toilet;ambulating Pt Will Perform Tub/Shower Transfer: with modified independence;Tub transfer;tub bench;rolling walker;ambulating  OT Frequency: Min 2X/week   Barriers to D/C:            Co-evaluation              AM-PAC PT "6 Clicks" Daily Activity     Outcome Measure Help from another person eating meals?: None Help from another person taking care of personal grooming?: None Help from another person toileting, which includes using toliet, bedpan, or urinal?: A Little Help from another person bathing (including washing, rinsing, drying)?: A Little Help from another person to put on and taking off regular upper body clothing?: None Help from another person to put on and taking off regular lower body clothing?: A Little 6 Click Score: 21   End of Session Equipment Utilized During Treatment: Rolling walker;Right knee immobilizer CPM Right Knee CPM Right Knee: Off Right Knee Flexion (Degrees): 90 Right Knee Extension (Degrees): 0 Additional Comments: foot roll Nurse Communication: Mobility  status;Precautions;Weight bearing status  Activity Tolerance: Patient tolerated treatment well Patient left: in chair;with call bell/phone within reach  OT Visit Diagnosis: Unsteadiness on feet (R26.81);Other abnormalities of gait and mobility (R26.89);Muscle weakness (generalized) (M62.81);Pain Pain - Right/Left: Right Pain - part of body: Knee                Time: 9798-9211 OT Time Calculation (min): 21 min Charges:  OT General Charges $OT Visit: 1 Visit OT Evaluation $OT Eval Low Complexity: 1 Low G-Codes:     Rafiel Mecca MSOT, OTR/L Acute Rehab Pager: [phone] Office: [place] 06/28/2017, 12:42 PM

## 2017-06-28 NOTE — Progress Notes (Signed)
Pt. Reported he has a area on back of right leg where he bumped into an object and it tore the skin. The area has a scab over it and redden.

## 2017-06-28 NOTE — Progress Notes (Signed)
Orthopedic Tech Progress Note Patient Details:  Spencer [name] Apr 07, 1946 [id number]  CPM Right Knee CPM Right Knee: On Right Knee Flexion (Degrees): 90 Right Knee Extension (Degrees): 0 Additional Comments: foot roll   [place] Pink 06/28/2017, 10:06 AM

## 2017-06-28 NOTE — Interval H&P Note (Signed)
History and Physical Interval Note:  06/28/2017 7:01 AM  Spencer [name]  has presented today for surgery, with the diagnosis of djd right knee  The various methods of treatment have been discussed with the patient and family. After consideration of risks, benefits and other options for treatment, the patient has consented to  Procedure(s): TOTAL KNEE ARTHROPLASTY (Right) as a surgical intervention .  The patient's history has been reviewed, patient examined, no change in status, stable for surgery.  I have reviewed the patient's chart and labs.  Questions were answered to the patient's satisfaction.     Lorn Junes

## 2017-06-28 NOTE — Op Note (Signed)
MRN:     [id number] DOB/AGE:    Nov 04, 1945 / 71 y.o.       OPERATIVE REPORT    DATE OF PROCEDURE:  06/28/2017       PREOPERATIVE DIAGNOSIS:   Primary localizedOA right knee      Estimated body mass index is 24.55 kg/m as calculated from the following:   Height as of 06/22/17: 6' (1.829 m).   Weight as of this encounter: 82.1 kg (181 lb).                                                        POSTOPERATIVE DIAGNOSIS:   same                                                                   PROCEDURE:  Procedure(s): TOTAL KNEE ARTHROPLASTY Using Depuy Attune RP implants #8 Femur, #8Tibia, 108mm  RP bearing, 35 Patella     SURGEON: Lorn Junes    ASSISTANT:  Kirstin Shepperson PA-C   (Present and scrubbed throughout the case, critical for assistance with exposure, retraction, instrumentation, and closure.)         ANESTHESIA: Spinal with Adductor Nerve Block     TOURNIQUET TIME: 01UXN   COMPLICATIONS:  None     SPECIMENS: None   INDICATIONS FOR PROCEDURE: The patient has  djd right knee, varus deformities, XR shows bone on bone arthritis. Patient has failed all conservative measures including anti-inflammatory medicines, narcotics, attempts at  exercise and weight loss, cortisone injections and viscosupplementation.  Risks and benefits of surgery have been discussed, questions answered.   DESCRIPTION OF PROCEDURE: The patient identified by armband, received  right femoral nerve block and IV antibiotics, in the holding area at [facility]. Patient taken to the operating room, appropriate anesthetic  monitors were attached Spinal anesthesia induced with  the patient in supine position, Foley catheter was inserted. Tourniquet  applied high to the operative thigh. Lateral post and foot positioner  applied to the table, the lower extremity was then prepped and draped  in usual sterile fashion from the ankle to the tourniquet. Time-out procedure was performed. The limb was  wrapped with an Esmarch bandage and the tourniquet inflated to 365 mmHg. We began the operation by making the anterior midline incision starting at handbreadth above the patella going over the patella 1 cm medial to and  4 cm distal to the tibial tubercle. Small bleeders in the skin and the  subcutaneous tissue identified and cauterized. Transverse retinaculum was incised and reflected medially and a medial parapatellar arthrotomy was accomplished. the patella was everted and theprepatellar fat pad resected. The superficial medial collateral  ligament was then elevated from anterior to posterior along the proximal  flare of the tibia and anterior half of the menisci resected. The knee was hyperflexed exposing bone on bone arthritis. Peripheral and notch osteophytes as well as the cruciate ligaments were then resected. We continued to  work our way around posteriorly along the proximal tibia, and externally  rotated the tibia subluxing it out from underneath the femur. A  McHale  retractor was placed through the notch and a lateral Hohmann retractor  placed, and we then drilled through the proximal tibia in line with the  axis of the tibia followed by an intramedullary guide rod and 2-degree  posterior slope cutting guide. The tibial cutting guide was pinned into place  allowing resection of 4 mm of bone medially and about 6 mm of bone  laterally because of her varus deformity. Satisfied with the tibial resection, we then  entered the distal femur 2 mm anterior to the PCL origin with the  intramedullary guide rod and applied the distal femoral cutting guide  set at 14mm, with 5 degrees of valgus. This was pinned along the  epicondylar axis. At this point, the distal femoral cut was accomplished without difficulty. We then sized for a #8 femoral component and pinned the guide in 3 degrees of external rotation.The chamfer cutting guide was pinned into place. The anterior, posterior, and chamfer cuts were  accomplished without difficulty followed by  the  RP box cutting guide and the box cut. We also removed posterior osteophytes from the posterior femoral condyles. At this  time, the knee was brought into full extension. We checked our  extension and flexion gaps and found them symmetric at 58mm.  The patella thickness measured at 25 mm. We set the cutting guide at 15 and removed the posterior 9.5-10 mm  of the patella sized for 35 button and drilled the lollipop. The knee  was then once again hyperflexed exposing the proximal tibia. We sized for a #8 tibial base plate, applied the smokestack and the conical reamer followed by the the Delta fin keel punch. We then hammered into place the  RP trial femoral component, inserted a 1 trial bearing, trial patellar button, and took the knee through range of motion from 0-130 degrees. No thumb pressure was required for patellar  tracking. At this point, all trial components were removed, a double batch of DePuy HV cement with  was mixed and applied to all bony metallic mating surfaces except for the posterior condyles of the femur itself. In order, we  hammered into place the tibial tray and removed excess cement, the femoral component and removed excess cement, a 63mm  RP bearing  was inserted, and the knee brought to full extension with compression.  The patellar button was clamped into place, and excess cement  removed. While the cement cured the wound was irrigated out with normal saline solution pulse lavage.. Ligament stability and patellar tracking were checked and found to be excellent.. The parapatellar arthrotomy was closed with  #1 Vicryl suture. The subcutaneous tissue with 0 and 2-0 undyed  Vicryl suture, and 4-0 Monocryl.. A dressing of Aquaseal,  4 x 4, dressing sponges, Webril, and Ace wrap applied. Needle and sponge count were correct times 2.The patient awakened, extubated, and taken to recovery room without difficulty. Vascular status was  normal, pulses 2+ and symmetric.   Lorn Junes 06/28/2017, 9:08 AM

## 2017-06-28 NOTE — Anesthesia Procedure Notes (Signed)
Procedure Name: MAC Date/Time: 06/28/2017 7:25 AM Performed by: Kyung Rudd, CRNA Pre-anesthesia Checklist: Patient identified, Emergency Drugs available, Suction available and Patient being monitored Patient Re-evaluated:Patient Re-evaluated prior to induction Oxygen Delivery Method: Simple face mask Preoxygenation: Pre-oxygenation with 100% oxygen Induction Type: IV induction Placement Confirmation: positive ETCO2 Dental Injury: Teeth and Oropharynx as per pre-operative assessment

## 2017-06-28 NOTE — Transfer of Care (Signed)
Immediate Anesthesia Transfer of Care Note  Patient: Spencer [name]  Procedure(s) Performed: TOTAL KNEE ARTHROPLASTY (Right Knee)  Patient Location: PACU  Anesthesia Type:Spinal  Level of Consciousness: awake, alert  and oriented  Airway & Oxygen Therapy: Patient Spontanous Breathing and Patient connected to nasal cannula oxygen  Post-op Assessment: Report given to RN and Post -op Vital signs reviewed and stable  Post vital signs: Reviewed and stable  Last Vitals:  Vitals:   06/28/17 0610 06/28/17 0937  BP: (!) 179/96   Pulse: 84   Resp: 20   Temp: 36.8 C (!) (P) 36.4 C  SpO2: 97%     Last Pain:  Vitals:   06/28/17 0610  TempSrc: Oral      Patients Stated Pain Goal: 3 (22/48/25 0037)  Complications: No apparent anesthesia complications

## 2017-06-28 NOTE — Progress Notes (Addendum)
06/28/17 1320  PT Evaluation Information  Last PT Received On 06/28/17  Assistance Needed +1  History of Present Illness 71 yo male s/p R TKA. PHM including cancer and prior Bil TKA.  Precautions  Precautions Fall;Knee  Precaution Booklet Issued Yes (comment)  Precaution Comments Reviewed supine ther ex with session.   Required Braces or Orthoses Knee Immobilizer - Right  Knee Immobilizer - Right Other (comment) (until discontinued. )  Restrictions  Weight Bearing Restrictions Yes  RLE Weight Bearing WBAT  Home Living  Family/patient expects to be discharged to: Private residence  Living Arrangements Spouse/significant other  Available Help at Discharge Family;Available 24 hours/day  Type of Home House  Home Access Stairs to enter  Entrance Stairs-Number of Steps 4  Entrance Stairs-Rails Right;Left  Home Layout One level  Bathroom Shower/Tub Tub/shower unit  Hydro - 2 wheels;Tub bench  Prior Function  Level of Independence Independent  Communication  Communication No difficulties  Pain Assessment  Pain Assessment 0-10  Pain Score 3  Pain Location R knee  Pain Descriptors / Indicators Constant;Discomfort;Grimacing  Pain Intervention(s) Limited activity within patient's tolerance;Monitored during session;Repositioned  Cognition  Arousal/Alertness Awake/alert  Behavior During Therapy WFL for tasks assessed/performed  Overall Cognitive Status Within Functional Limits for tasks assessed  Upper Extremity Assessment  Upper Extremity Assessment Defer to OT evaluation  Lower Extremity Assessment  Lower Extremity Assessment RLE deficits/detail  RLE Deficits / Details Sensory in tact. Deficits  consistent with post op pain and weakness. Able to perform exercises below.   Cervical / Trunk Assessment  Cervical / Trunk Assessment Normal  Bed Mobility  Overal bed mobility Needs Assistance  Bed Mobility Sit to Supine  Sit to  supine Min assist  General bed mobility comments Min A for RLE lift assist.   Transfers  Overall transfer level Needs assistance  Equipment used Rolling walker (2 wheeled)  Transfers Sit to/from Stand  Sit to Stand Min guard  General transfer comment Min guard for safety. Demonstrated safe hand placement.   Ambulation/Gait  Ambulation/Gait assistance Min guard  Ambulation Distance (Feet) 100 Feet  Assistive device Rolling walker (2 wheeled)  Gait Pattern/deviations Step-to pattern;Decreased step length - right;Decreased step length - left;Decreased weight shift to right;Antalgic  General Gait Details Slow, slightly antalgic gait. OVerall steady with use of RW. Required cues for appropriate sequencing with RW.   Gait velocity Decreased  Gait velocity interpretation Below normal speed for age/gender  Balance  Overall balance assessment Needs assistance  Sitting-balance support No upper extremity supported;Feet supported  Sitting balance-Leahy Scale Good  Standing balance support Bilateral upper extremity supported;During functional activity  Standing balance-Leahy Scale Poor  Standing balance comment Reliant on RW for stability   General Comments  General comments (skin integrity, edema, etc.) Pt's wife present throughout session   Exercises  Exercises Total Joint  Total Joint Exercises  Ankle Circles/Pumps AROM;Both;20 reps  Quad Sets AROM;Right;10 reps  Towel Squeeze AROM;Both;10 reps  Short Arc Quad AROM;Right;10 reps  Heel Slides AROM;Right;10 reps  Hip ABduction/ADduction AROM;Right;10 reps  PT - End of Session  Equipment Utilized During Treatment Gait belt;Right knee immobilizer  Activity Tolerance Patient tolerated treatment well  Patient left in bed;with call bell/phone within reach;with family/visitor present  Nurse Communication Mobility status  CPM Right Knee  CPM Right Knee Off  PT Assessment  PT Recommendation/Assessment Patient needs continued PT services  PT  Visit Diagnosis Other abnormalities of gait and mobility (R26.89);Pain  Pain -  Right/Left Right  Pain - part of body Knee  PT Problem List Decreased strength;Decreased range of motion;Decreased balance;Decreased mobility;Decreased knowledge of use of DME;Pain  PT Plan  PT Frequency (ACUTE ONLY) 7X/week  PT Treatment/Interventions (ACUTE ONLY) Gait training;DME instruction;Stair training;Functional mobility training;Therapeutic activities;Therapeutic exercise;Balance training;Neuromuscular re-education;Patient/family education  AM-PAC PT "6 Clicks" Daily Activity Outcome Measure  Difficulty turning over in bed (including adjusting bedclothes, sheets and blankets)? 3  Difficulty moving from lying on back to sitting on the side of the bed?  1  Difficulty sitting down on and standing up from a chair with arms (e.g., wheelchair, bedside commode, etc,.)? 1  Help needed moving to and from a bed to chair (including a wheelchair)? 3  Help needed walking in hospital room? 3  Help needed climbing 3-5 steps with a railing?  3  6 Click Score 14  Mobility G Code  CK  PT Recommendation  Follow Up Recommendations DC plan and follow up therapy as arranged by surgeon;Supervision for mobility/OOB  PT equipment None recommended by PT  Individuals Consulted  Consulted and Agree with Results and Recommendations Patient;Family member/caregiver  Family Member Consulted wife   Acute Rehab PT Goals  Patient Stated Goal Go home  PT Goal Formulation With patient  Time For Goal Achievement 07/05/17  Potential to Achieve Goals Good  PT Time Calculation  PT Start Time (ACUTE ONLY) 1302  PT Stop Time (ACUTE ONLY) 1335  PT Time Calculation (min) (ACUTE ONLY) 33 min  PT General Charges  $$ ACUTE PT VISIT 1 Visit  PT Evaluation  $PT Eval Low Complexity 1 Low  PT Treatments  $Gait Training 8-22 mins  Written Expression  Dominant Hand Right   Pt is s/p surgery above with deficits below. PTA, pt was independent  with functional mobility. Upon eval, pt presenting with post op pain and weakness, however, tolerated gait training and RLE exercise very well. Required min to min guard assist for mobility with RW. Reports wife will be able to assist at home and has all necessary DME. Follow up recommendations per MD arrangements. Will continue to follow acutely to maximize functional mobility independence and safety.   Leighton Ruff, PT, DPT  Acute Rehabilitation Services  Pager: [phone]

## 2017-06-28 NOTE — Anesthesia Postprocedure Evaluation (Signed)
Anesthesia Post Note  Patient: Spencer [name]  Procedure(s) Performed: TOTAL KNEE ARTHROPLASTY (Right Knee)     Patient location during evaluation: PACU Anesthesia Type: Spinal Level of consciousness: awake and alert Pain management: pain level controlled Vital Signs Assessment: post-procedure vital signs reviewed and stable Respiratory status: spontaneous breathing and respiratory function stable Cardiovascular status: blood pressure returned to baseline and stable Postop Assessment: spinal receding and no apparent nausea or vomiting Anesthetic complications: no    Last Vitals:  Vitals:   06/28/17 1044 06/28/17 1106  BP:  (!) 146/86  Pulse: 79 84  Resp: 14 16  Temp: (!) 36.1 C 36.6 C  SpO2: 95% 96%    Last Pain:  Vitals:   06/28/17 1106  TempSrc: Oral                 Audry Pili

## 2017-06-28 NOTE — Anesthesia Procedure Notes (Signed)
Spinal  Patient location during procedure: OR Start time: 06/28/2017 7:18 AM End time: 06/28/2017 7:22 AM Staffing Anesthesiologist: Audry Pili, MD Performed: anesthesiologist  Preanesthetic Checklist Completed: patient identified, surgical consent, pre-op evaluation, timeout performed, IV checked, risks and benefits discussed and monitors and equipment checked Spinal Block Patient position: sitting Prep: DuraPrep Patient monitoring: heart rate, cardiac monitor, continuous pulse ox and blood pressure Approach: midline Location: L3-4 Injection technique: single-shot Needle Needle type: Pencan  Needle gauge: 24 G Additional Notes Functioning IV was confirmed and monitors were applied. Sterile prep and drape, including hand hygiene, mask, and sterile gloves were used. The patient was positioned and the spine was prepped. The skin was anesthetized with lidocaine. Free flow of clear CSF was obtained prior to injecting local anesthetic into the CSF. The spinal needle aspirated freely following injection. The needle was carefully withdrawn. The patient tolerated the procedure well. Consent was obtained prior to the procedure with all questions answered and concerns addressed. Risks including, but not limited to, bleeding, infection, nerve damage, paralysis, failed block, inadequate analgesia, allergic reaction, high spinal, itching, and headache were discussed and the patient wished to proceed.  Renold Don, MD

## 2017-06-28 NOTE — Anesthesia Procedure Notes (Signed)
Anesthesia Regional Block: Adductor canal block   Pre-Anesthetic Checklist: ,, timeout performed, Correct Patient, Correct Site, Correct Laterality, Correct Procedure, Correct Position, site marked, Risks and benefits discussed,  Surgical consent,  Pre-op evaluation,  At surgeon's request and post-op pain management  Laterality: Right  Prep: chloraprep       Needles:  Injection technique: Single-shot  Needle Type: Echogenic Needle     Needle Length: 10cm  Needle Gauge: 21     Additional Needles:   Narrative:  Start time: 06/28/2017 6:56 AM End time: 06/28/2017 6:59 AM Injection made incrementally with aspirations every 5 mL.  Performed by: Personally  Anesthesiologist: Audry Pili, MD  Additional Notes: No pain on injection. No increased resistance to injection. Injection made in 5cc increments. Good needle visualization. Patient tolerated the procedure well.

## 2017-06-29 ENCOUNTER — Other Ambulatory Visit: Payer: Self-pay | Admitting: Physician Assistant

## 2017-06-29 ENCOUNTER — Encounter (HOSPITAL_COMMUNITY): Payer: Self-pay | Admitting: Orthopedic Surgery

## 2017-06-29 LAB — CBC
HEMATOCRIT: 34.9 % — AB (ref 39.0–52.0)
Hemoglobin: 12 g/dL — ABNORMAL LOW (ref 13.0–17.0)
MCH: 31.9 pg (ref 26.0–34.0)
MCHC: 34.4 g/dL (ref 30.0–36.0)
MCV: 92.8 fL (ref 78.0–100.0)
Platelets: 183 10*3/uL (ref 150–400)
RBC: 3.76 MIL/uL — ABNORMAL LOW (ref 4.22–5.81)
RDW: 11.6 % (ref 11.5–15.5)
WBC: 12.2 10*3/uL — ABNORMAL HIGH (ref 4.0–10.5)

## 2017-06-29 LAB — BASIC METABOLIC PANEL
Anion gap: 6 (ref 5–15)
BUN: 12 mg/dL (ref 6–20)
CALCIUM: 8.5 mg/dL — AB (ref 8.9–10.3)
CO2: 25 mmol/L (ref 22–32)
CREATININE: 0.78 mg/dL (ref 0.61–1.24)
Chloride: 106 mmol/L (ref 101–111)
GFR calc non Af Amer: >60 mL/min (ref >60)
Glucose, Bld: 145 mg/dL — ABNORMAL HIGH (ref 65–99)
Potassium: 4.2 mmol/L (ref 3.5–5.1)
Sodium: 137 mmol/L (ref 135–145)

## 2017-06-29 MED ORDER — POLYETHYLENE GLYCOL 3350 17 G PO PACK: 17.0000 g | PACK | Freq: Two times a day (BID) | ORAL | 0 refills | Status: DC

## 2017-06-29 MED ORDER — OXYCODONE HCL 5 MG PO TABS: 5.0000 mg | ORAL_TABLET | ORAL | 0 refills | Status: DC | PRN

## 2017-06-29 MED ORDER — DOCUSATE SODIUM 100 MG PO CAPS: ORAL_CAPSULE | ORAL | 0 refills | Status: DC

## 2017-06-29 MED ORDER — ASPIRIN 325 MG PO TBEC: DELAYED_RELEASE_TABLET | ORAL | 0 refills | Status: DC

## 2017-06-29 NOTE — Progress Notes (Signed)
Orthopedic Tech Progress Note Patient Details:  Spencer [name] 04/08/1946 [id number]  Patient ID: [name], male   DOB: 30-Oct-1945, 71 y.o.   MRN: [id number] Pt will call for cpm after breakfast  Karolee Stamps 06/29/2017, 6:11 AM

## 2017-06-29 NOTE — Progress Notes (Signed)
Occupational Therapy Treatment Patient Details Name: Spencer [name] MRN: [id number] DOB: Jul 06, 1946 Today's Date: 06/29/2017    History of present illness 71 yo male s/p R TKA. PHM including cancer and prior Bil TKA.   OT comments  Pt progressing towards goals; completing standing grooming ADLs upon entering room. Noted RW to be just outside of bathroom door upon entering room while Pt standing at sink in bathroom, no LOB noted though education provided on safe RW use/placement during ADL completion with Pt verbalizing understanding. Pt completed functional mobility and tub transfer using tub bench at RW level with overall supervision throughout. Reviewed/educated on safety and compensatory techniques for completing ADLs with Pt verbalizing understanding. Feel Pt will safely progress home with spouse assist PRN. Will continue to follow while in acute setting to progress Pt's safety and independence with ADLs and mobility.    Follow Up Recommendations  No OT follow up;Supervision/Assistance - 24 hour    Equipment Recommendations  None recommended by OT          Precautions / Restrictions Precautions Precautions: Fall;Knee Precaution Comments: Reviewed with Pt  Restrictions Weight Bearing Restrictions: Yes RLE Weight Bearing: Weight bearing as tolerated       Mobility Bed Mobility               General bed mobility comments: OOB in bathroom upon arrival   Transfers Overall transfer level: Needs assistance Equipment used: Rolling walker (2 wheeled) Transfers: Sit to/from Stand Sit to Stand: Supervision         General transfer comment: supervision for safety; verbal cues for hand/RW placement     Balance Overall balance assessment: Needs assistance Sitting-balance support: No upper extremity supported;Feet supported Sitting balance-Leahy Scale: Good     Standing balance support: Bilateral upper extremity supported;During functional activity Standing  balance-Leahy Scale: Fair Standing balance comment: maintains static standing without UE support                            ADL either performed or assessed with clinical judgement   ADL Overall ADL's : Needs assistance/impaired     Grooming: Supervision/safety;Standing;Oral care                 Lower Body Dressing Details (indicate cue type and reason): reviewed education for LB dressing; Pt reports completing this AM without assist  Toilet Transfer: Min guard;Ambulation;RW Toilet Transfer Details (indicate cue type and reason): simulated in transfer to recliner     Tub/ Shower Transfer: Tub transfer;Ambulation;Tub bench;Supervision/safety;Rolling walker Tub/Shower Transfer Details (indicate cue type and reason): ecuated on safety/technique; Pt return demonstrates with good carry over  Functional mobility during ADLs: Min guard;Rolling walker General ADL Comments: Pt brushing teeth upon entering room, though standing in bathroom with RW right outside doorway; educated on safe RW use/placement during functional mobility and ADL completion with Pt verbalizing understanding; Pt completes room and hallway level functional mobility with MinGuard-supervision at RW level to therapy gym for practice of tub transfer                        Cognition Arousal/Alertness: Awake/alert Behavior During Therapy: [place] for tasks assessed/performed Overall Cognitive Status: Within Functional Limits for tasks assessed  Pertinent Vitals/ Pain       Pain Assessment: Faces Faces Pain Scale: Hurts a little bit Pain Location: R knee Pain Descriptors / Indicators: Discomfort;Sore Pain Intervention(s): Monitored during session;Repositioned                                                          Frequency  Min 2X/week        Progress Toward Goals  OT Goals(current  goals can now be found in the care plan section)  Progress towards OT goals: Progressing toward goals  Acute Rehab OT Goals Patient Stated Goal: Go home OT Goal Formulation: With patient Time For Goal Achievement: 07/12/17 Potential to Achieve Goals: Good  Plan Discharge plan remains appropriate                     AM-PAC PT "6 Clicks" Daily Activity     Outcome Measure   Help from another person eating meals?: None Help from another person taking care of personal grooming?: None Help from another person toileting, which includes using toliet, bedpan, or urinal?: A Little Help from another person bathing (including washing, rinsing, drying)?: A Little Help from another person to put on and taking off regular upper body clothing?: None Help from another person to put on and taking off regular lower body clothing?: A Little 6 Click Score: 21    End of Session Equipment Utilized During Treatment: Rolling walker  OT Visit Diagnosis: Unsteadiness on feet (R26.81);Other abnormalities of gait and mobility (R26.89);Muscle weakness (generalized) (M62.81);Pain Pain - Right/Left: Right Pain - part of body: Knee   Activity Tolerance Patient tolerated treatment well   Patient Left in chair;with call bell/phone within reach   Nurse Communication Mobility status        Time: 1594-5859 OT Time Calculation (min): 16 min  Charges: OT General Charges $OT Visit: 1 Visit OT Treatments $Self Care/Home Management : 8-22 mins  Lou Cal, OT Pager [phone] 06/29/2017    Raymondo Band 06/29/2017, 11:01 AM

## 2017-06-29 NOTE — Progress Notes (Signed)
Subjective: 1 Day Post-Op Procedure(s) (LRB): TOTAL KNEE ARTHROPLASTY (Right) Patient reports pain as 4 on 0-10 scale.    Objective: Vital signs in last 24 hours: Temp:  [98.1 F (36.7 C)-98.6 F (37 C)] 98.3 F (36.8 C) (11/27 0600) Pulse Rate:  [94-100] 100 (11/27 0600) Resp:  [18-20] 19 (11/27 0600) BP: (136-160)/(86-97) 158/94 (11/27 0600) SpO2:  [96 %-98 %] 96 % (11/27 0600)  Intake/Output from previous day: 11/26 0701 - 11/27 0700 In: 1176.7 [P.O.:360; I.V.:716.7; IV Piggyback:100] Out: 2945 [Urine:2895; Blood:50] Intake/Output this shift: Total I/O In: 240 [P.O.:240] Out: -   Recent Labs    06/29/17 0631  HGB 12.0*   Recent Labs    06/29/17 0631  WBC 12.2*  RBC 3.76*  HCT 34.9*  PLT 183   Recent Labs    06/29/17 0631  NA 137  K 4.2  CL 106  CO2 25  BUN 12  CREATININE 0.78  GLUCOSE 145*  CALCIUM 8.5*   No results for input(s): LABPT, INR in the last 72 hours.  ABD soft Neurovascular intact Sensation intact distally Incision: dressing C/D/I and scant drainage  Assessment/Plan: 1 Day Post-Op Procedure(s) (LRB): TOTAL KNEE ARTHROPLASTY (Right)  Active Problems:   Primary localized osteoarthritis of right knee Patient did very well with ambulation this am.  IV fluids were stopped.  This afternoon patient got lightheaded and dizzy with ambulation so IV fluids were restart to improve fluid balance after blood loss from surgery.  Medically necessary for patient to stay one more night after his knee replacement due to hemodynamic instability and the need for IV hydration to balance this. Advance diet Up with therapy  Linda Hedges 06/29/2017, 12:40 PM

## 2017-06-29 NOTE — Care Management Note (Signed)
Case Management Note  Patient Details  Name: Spencer [name] MRN: [id number] Date of Birth: [birth year]  Subjective/Objective:  71 yr old gentleman s/p right total knee arthroplasty.                  Action/Plan: Patient was preoperatively setup with [facility], no changes. Patient has RW and 3in1 from previous surgery, CPM delivered to home by [employer]. Patient will have family support at discharge.    Expected Discharge Date:  06/29/17               Expected Discharge Plan:  [place]  In-House Referral:  NA  Discharge planning Services  CM Consult  Post Acute Care Choice:  Durable Medical Equipment, Home Health Choice offered to:  Patient  DME Arranged:  CPM(Has RW and 3in1) DME Agency:  [employer]  HH Arranged:  PT HH Agency:  [facility] (formerly [employer])  Status of Service:  Completed, signed off  If discussed at [employer], dates discussed:    Additional Comments:  Ninfa Meeker, RN 06/29/2017, 12:18 PM

## 2017-06-29 NOTE — Progress Notes (Signed)
Physical Therapy Treatment Patient Details Name: Spencer [name] MRN: [id number] DOB: [birth year] Today's Date: 06/29/2017    History of Present Illness 71 yo male s/p R TKA. PHM including cancer and prior Bil TKA.    PT Comments    Patient is making good progress with PT.  From a mobility standpoint anticipate patient will be ready for DC home when medically ready.    Follow Up Recommendations  DC plan and follow up therapy as arranged by surgeon;Supervision for mobility/OOB     Equipment Recommendations  None recommended by PT    Recommendations for Other Services       Precautions / Restrictions Precautions Precautions: Fall;Knee Precaution Booklet Issued: Yes (comment) Precaution Comments: Reviewed with Pt  Required Braces or Orthoses: Knee Immobilizer - Right Knee Immobilizer - Right: Other (comment)(until discontinued. ) Restrictions Weight Bearing Restrictions: Yes RLE Weight Bearing: Weight bearing as tolerated    Mobility  Bed Mobility               General bed mobility comments: pt OOB in chair upon arrival  Transfers Overall transfer level: Needs assistance Equipment used: Rolling walker (2 wheeled) Transfers: Sit to/from Stand Sit to Stand: Supervision         General transfer comment: supervision for safety  Ambulation/Gait Ambulation/Gait assistance: Supervision Ambulation Distance (Feet): 200 Feet Assistive device: Rolling walker (2 wheeled) Gait Pattern/deviations: Decreased weight shift to right;Step-through pattern Gait velocity: Decreased   General Gait Details: cues for posture and sequencing; good step length symmetry   Stairs Stairs: Yes   Stair Management: One rail Right;Step to pattern;Forwards Number of Stairs: 10 General stair comments: cues for sequencing and technique  Wheelchair Mobility    Modified Rankin (Stroke Patients Only)       Balance Overall balance assessment: Needs assistance Sitting-balance  support: No upper extremity supported;Feet supported Sitting balance-Leahy Scale: Good     Standing balance support: Bilateral upper extremity supported;During functional activity Standing balance-Leahy Scale: Fair Standing balance comment: maintains static standing without UE support                             Cognition Arousal/Alertness: Awake/alert Behavior During Therapy: WFL for tasks assessed/performed Overall Cognitive Status: Within Functional Limits for tasks assessed                                        Exercises Total Joint Exercises Quad Sets: AROM;Right;10 reps Short Arc Quad: AROM;Right;10 reps Heel Slides: AROM;Right;10 reps Hip ABduction/ADduction: AROM;Right;10 reps Straight Leg Raises: AROM;Right;10 reps Long Arc Quad: AROM;Right;10 reps Knee Flexion: AROM;Right;10 reps;Seated Goniometric ROM: 0-90    General Comments        Pertinent Vitals/Pain Pain Assessment: Faces Faces Pain Scale: Hurts a little bit Pain Location: R knee Pain Descriptors / Indicators: Discomfort;Sore Pain Intervention(s): Monitored during session;Premedicated before session;Repositioned    Home Living                      Prior Function            PT Goals (current goals can now be found in the care plan section) Acute Rehab PT Goals Patient Stated Goal: Go home PT Goal Formulation: With patient Time For Goal Achievement: 07/05/17 Potential to Achieve Goals: Good Progress towards PT goals: Progressing toward goals    Frequency  7X/week      PT Plan Current plan remains appropriate    Co-evaluation              AM-PAC PT "6 Clicks" Daily Activity  Outcome Measure  Difficulty turning over in bed (including adjusting bedclothes, sheets and blankets)?: A Little Difficulty moving from lying on back to sitting on the side of the bed? : A Little Difficulty sitting down on and standing up from a chair with arms (e.g.,  wheelchair, bedside commode, etc,.)?: Unable Help needed moving to and from a bed to chair (including a wheelchair)?: A Little Help needed walking in hospital room?: A Little Help needed climbing 3-5 steps with a railing? : A Little 6 Click Score: 16    End of Session Equipment Utilized During Treatment: Gait belt Activity Tolerance: Patient tolerated treatment well Patient left: with call bell/phone within reach;in chair Nurse Communication: Mobility status PT Visit Diagnosis: Other abnormalities of gait and mobility (R26.89);Pain Pain - Right/Left: Right Pain - part of body: Knee     Time: 9311-2162 PT Time Calculation (min) (ACUTE ONLY): 21 min  Charges:  $Gait Training: 8-22 mins                    G Codes:       Earney Navy, PTA Pager: [phone]     Darliss Cheney 06/29/2017, 1:00 PM

## 2017-06-30 LAB — CBC
HCT: 33.2 % — ABNORMAL LOW (ref 39.0–52.0)
Hemoglobin: 11.3 g/dL — ABNORMAL LOW (ref 13.0–17.0)
MCH: 31.6 pg (ref 26.0–34.0)
MCHC: 34 g/dL (ref 30.0–36.0)
MCV: 92.7 fL (ref 78.0–100.0)
PLATELETS: 178 10*3/uL (ref 150–400)
RBC: 3.58 MIL/uL — AB (ref 4.22–5.81)
RDW: 11.7 % (ref 11.5–15.5)
WBC: 13 10*3/uL — ABNORMAL HIGH (ref 4.0–10.5)

## 2017-06-30 LAB — BASIC METABOLIC PANEL
Anion gap: 6 (ref 5–15)
BUN: 13 mg/dL (ref 6–20)
CO2: 25 mmol/L (ref 22–32)
CREATININE: 0.65 mg/dL (ref 0.61–1.24)
Calcium: 8.5 mg/dL — ABNORMAL LOW (ref 8.9–10.3)
Chloride: 107 mmol/L (ref 101–111)
GFR calc Af Amer: >60 mL/min (ref >60)
Glucose, Bld: 115 mg/dL — ABNORMAL HIGH (ref 65–99)
POTASSIUM: 3.9 mmol/L (ref 3.5–5.1)
SODIUM: 138 mmol/L (ref 135–145)

## 2017-06-30 NOTE — Discharge Summary (Signed)
Patient ID: Spencer [name] MRN: [id number] DOB/AGE: 03/19/1946 71 y.o.  Admit date: 06/28/2017 Discharge date: 06/30/2017  Admission Diagnoses:  Active Problems:   Primary localized osteoarthritis of right knee   Discharge Diagnoses:  Same  Past Medical History:  Diagnosis Date  . 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 Fracture    ribs/ right wrist x3/ left ankle/cracked vertebrae-cervical/  . GERD (gastroesophageal reflux disease)   . Primary localized osteoarthritis of left knee   . Primary localized osteoarthritis of right knee     Surgeries: Procedure(s): TOTAL KNEE ARTHROPLASTY on 06/28/2017   Consultants:   Discharged Condition: Improved  Hospital Course: Spencer [name] is an 71 y.o. male who was admitted 06/28/2017 for operative treatment of<principal problem not specified>. Patient has severe unremitting pain that affects sleep, daily activities, and work/hobbies. After pre-op clearance the patient was taken to the operating room on 06/28/2017 and underwent  Procedure(s): TOTAL KNEE ARTHROPLASTY.    Patient was given perioperative antibiotics:  Anti-infectives (From admission, onward)   Start     Dose/Rate Route Frequency Ordered Stop   06/28/17 1330  ceFAZolin (ANCEF) IVPB 2g/100 mL premix     2 g 200 mL/hr over 30 Minutes Intravenous [name] 6 hours 06/28/17 1101 06/29/17 0044   06/28/17 0700  ceFAZolin (ANCEF) IVPB 2g/100 mL premix     2 g 200 mL/hr over 30 Minutes Intravenous To [facility]Stay [facility] 06/25/17 0722 06/28/17 0735       Patient was given sequential compression devices, early ambulation, and chemoprophylaxis to prevent DVT. Post op day one the patient did well in the morning but had some light headedness and dizziness with ambulation.  IV fluids were restarted.  Post op day two, patient was much more hemodynamically stable without dizziness or tachycardia.   Patient benefited maximally from hospital stay and there were no  other complications.    Recent vital signs:  Patient Vitals for the past 24 hrs:  BP Temp Temp src Pulse Resp SpO2  06/30/17 0535 (!) 152/92 97.7 F (36.5 C) Oral 84 16 97 %  06/29/17 2213 (!) 166/108 97.9 F (36.6 C) Oral 90 17 98 %  06/29/17 1521 (!) 162/94 (!) 97.5 F (36.4 C) Oral (!) 102 - 98 %     Recent laboratory studies:  Recent Labs    06/29/17 0631 06/30/17 0545  WBC 12.2* 13.0*  HGB 12.0* 11.3*  HCT 34.9* 33.2*  PLT 183 178  NA 137  --   K 4.2  --   CL 106  --   CO2 25  --   BUN 12  --   CREATININE 0.78  --   GLUCOSE 145*  --   CALCIUM 8.5*  --      Discharge Medications:   Allergies as of 06/30/2017   No Known Allergies     Medication List    STOP taking these medications   aspirin 81 MG chewable tablet Replaced by:  aspirin 325 MG EC tablet   NYQUIL PO   polyethylene glycol-electrolytes 420 g solution Commonly known as:  TRILYTE     TAKE these medications   acetaminophen 500 MG tablet Commonly known as:  TYLENOL Take 1,000 mg [name] 6 (six) hours as needed by mouth for moderate pain or headache.   aspirin 325 MG EC tablet 1 tab a day for the next 30 days to prevent blood clots Replaces:  aspirin 81 MG chewable tablet   DHEA  50 PO Take 50 mg daily by mouth.   docusate sodium 100 MG capsule Commonly known as:  COLACE 1 tab 2 times a day while on narcotics.  STOOL SOFTENER   famotidine 10 MG chewable tablet Commonly known as:  PEPCID AC Chew 10 mg by mouth at bedtime as needed for heartburn.   Fish Oil 1200 MG Caps Take 1,200 mg by mouth daily with supper.   Melatonin 3 MG Tabs Take 3 mg by mouth at bedtime.   multivitamin with minerals Tabs tablet Take 1 tablet by mouth daily.   omeprazole 20 MG tablet Commonly known as:  PRILOSEC OTC Take 20 mg by mouth daily as needed (for late night snacking).   OVER THE COUNTER MEDICATION Take 4 capsules daily after supper by mouth. Fero Vital Supplement   oxyCODONE 5 MG immediate  release tablet Commonly known as:  ROXICODONE Take 1 tablet (5 mg total) by mouth [name] 4 (four) hours as needed for moderate pain.   polyethylene glycol packet Commonly known as:  MIRALAX / GLYCOLAX Take 17 g by mouth 2 (two) times daily.   VASELINE INTENSIVE CARE EX Apply 1 application at bedtime topically.            Discharge Care Instructions  (From admission, onward)        Start     Ordered   06/29/17 0000  Change dressing    Comments:  DO NOT REMOVE BANDAGE OVER SURGICAL INCISION.  [place] WHOLE LEG INCLUDING OVER THE WATERPROOF BANDAGE WITH SOAP AND WATER [name] DAY.   06/29/17 1145      Diagnostic Studies: No results found.  Disposition: 01-Home or Self Care  Discharge Instructions    Ambulatory referral to Physical Therapy   Complete by:  As directed    eval and treat 2 times a week for gait training, range of motion, soft tissue mobilization, strength and balance   CPM   Complete by:  As directed    Continuous passive motion machine (CPM):      Use the CPM from 0 to 90 for 6 hours per day.       You may break it up into 2 or 3 sessions per day.      Use CPM for 2 weeks or until you are told to stop.   Call MD / Call 911   Complete by:  As directed    If you experience chest pain or shortness of breath, CALL 911 and be transported to the hospital emergency room.  If you develope a fever above 101 F, pus (white drainage) or increased drainage or redness at the wound, or calf pain, call your surgeon's office.   Change dressing   Complete by:  As directed    DO NOT REMOVE BANDAGE OVER SURGICAL INCISION.  [place] WHOLE LEG INCLUDING OVER THE WATERPROOF BANDAGE WITH SOAP AND WATER [name] DAY.   Constipation Prevention   Complete by:  As directed    Drink plenty of fluids.  Prune juice may be helpful.  You may use a stool softener, such as Colace (over the counter) 100 mg twice a day.  Use MiraLax (over the counter) for constipation as needed.   Diet - low sodium heart  healthy   Complete by:  As directed    Discharge instructions   Complete by:  As directed    INSTRUCTIONS AFTER JOINT REPLACEMENT   Remove items at home which could result in a fall. This includes throw rugs or  furniture in walking pathways ICE to the affected joint [name] three hours while awake for 30 minutes at a time, for at least the first 3-5 days, and then as needed for pain and swelling.  Continue to use ice for pain and swelling. You may notice swelling that will progress down to the foot and ankle.  This is normal after surgery.  Elevate your leg when you are not up walking on it.   Continue to use the breathing machine you got in the hospital (incentive spirometer) which will help keep your temperature down.  It is common for your temperature to cycle up and down following surgery, especially at night when you are not up moving around and exerting yourself.  The breathing machine keeps your lungs expanded and your temperature down.   DIET:  As you were doing prior to hospitalization, we recommend a well-balanced diet.  DRESSING / WOUND CARE / SHOWERING  Keep the surgical dressing until follow up.  The dressing is water proof, so you can shower without any extra covering.  IF THE DRESSING FALLS OFF or the wound gets wet inside, change the dressing with sterile gauze.  Please use good hand washing techniques before changing the dressing.  Do not use any lotions or creams on the incision until instructed by your surgeon.    ACTIVITY  Increase activity slowly as tolerated, but follow the weight bearing instructions below.   No driving for 6 weeks or until further direction given by your physician.  You cannot drive while taking narcotics.  No lifting or carrying greater than 10 lbs. until further directed by your surgeon. Avoid periods of inactivity such as sitting longer than an hour when not asleep. This helps prevent blood clots.  You may return to work once you are authorized by your  doctor.     WEIGHT BEARING   Weight bearing as tolerated with assist device (walker, cane, etc) as directed, use it as long as suggested by your surgeon or therapist, typically at least 2-3 weeks.   EXERCISES  Results after joint replacement surgery are often greatly improved when you follow the exercise, range of motion and muscle strengthening exercises prescribed by your doctor. Safety measures are also important to protect the joint from further injury. Any time any of these exercises cause you to have increased pain or swelling, decrease what you are doing until you are comfortable again and then slowly increase them. If you have problems or questions, call your caregiver or physical therapist for advice.   Rehabilitation is important following a joint replacement. After just a few days of immobilization, the muscles of the leg can become weakened and shrink (atrophy).  These exercises are designed to build up the tone and strength of the thigh and leg muscles and to improve motion. Often times heat used for twenty to thirty minutes before working out will loosen up your tissues and help with improving the range of motion but do not use heat for the first two weeks following surgery (sometimes heat can increase post-operative swelling).   These exercises can be done on a training (exercise) mat, on the floor, on a table or on a bed. Use whatever works the best and is most comfortable for you.    Use music or television while you are exercising so that the exercises are a pleasant break in your day. This will make your life better with the exercises acting as a break in your routine that you can look  forward to.   Perform all exercises about fifteen times, three times per day or as directed.  You should exercise both the operative leg and the other leg as well.   Exercises include:  Quad Sets - Tighten up the muscle on the front of the thigh (Quad) and hold for 5-10 seconds.   Straight Leg  Raises - With your knee straight (if you were given a brace, keep it on), lift the leg to 60 degrees, hold for 3 seconds, and slowly lower the leg.  Perform this exercise against resistance later as your leg gets stronger.  Leg Slides: Lying on your back, slowly slide your foot toward your buttocks, bending your knee up off the floor (only go as far as is comfortable). Then slowly slide your foot back down until your leg is flat on the floor again.  Angel Wings: Lying on your back spread your legs to the side as far apart as you can without causing discomfort.  Hamstring Strength:  Lying on your back, push your heel against the floor with your leg straight by tightening up the muscles of your buttocks.  Repeat, but this time bend your knee to a comfortable angle, and push your heel against the floor.  You may put a pillow under the heel to make it more comfortable if necessary.   A rehabilitation program following joint replacement surgery can speed recovery and prevent re-injury in the future due to weakened muscles. Contact your doctor or a physical therapist for more information on knee rehabilitation.    CONSTIPATION  Constipation is defined medically as fewer than three stools per week and severe constipation as less than one stool per week.  Even if you have a regular bowel pattern at home, your normal regimen is likely to be disrupted due to multiple reasons following surgery.  Combination of anesthesia, postoperative narcotics, change in appetite and fluid intake all can affect your bowels.   YOU MUST use at least one of the following options; they are listed in order of increasing strength to get the job done.  They are all available over the counter, and you may need to use some, POSSIBLY even all of these options:    Drink plenty of fluids (prune juice may be helpful) and high fiber foods Colace 100 mg by mouth twice a day  Senokot for constipation as directed and as needed Dulcolax  (bisacodyl), take with full glass of water  Miralax (polyethylene glycol) once or twice a day as needed.  If you have tried all these things and are unable to have a bowel movement in the first 3-4 days after surgery call either your surgeon or your primary doctor.    If you experience loose stools or diarrhea, hold the medications until you stool forms back up.  If your symptoms do not get better within 1 week or if they get worse, check with your doctor.  If you experience "the worst abdominal pain ever" or develop nausea or vomiting, please contact the office immediately for further recommendations for treatment.   ITCHING:  If you experience itching with your medications, try taking only a single pain pill, or even half a pain pill at a time.  You can also use Benadryl over the counter for itching or also to help with sleep.   TED HOSE STOCKINGS:  Use stockings on both legs until for at least 2 weeks or as directed by physician office. They may be removed at night for sleeping.  MEDICATIONS:  See your medication summary on the "After Visit Summary" that nursing will review with you.  You may have some home medications which will be placed on hold until you complete the course of blood thinner medication.  It is important for you to complete the blood thinner medication as prescribed.  PRECAUTIONS:  If you experience chest pain or shortness of breath - call 911 immediately for transfer to the hospital emergency department.   If you develop a fever greater that 101 F, purulent drainage from wound, increased redness or drainage from wound, foul odor from the wound/dressing, or calf pain - CONTACT YOUR SURGEON.                                                   FOLLOW-UP APPOINTMENTS:  If you do not already have a post-op appointment, please call the office for an appointment to be seen by your surgeon.  Guidelines for how soon to be seen are listed in your "After Visit Summary", but are typically  between 1-4 weeks after surgery.  OTHER INSTRUCTIONS:   Knee Replacement:  Do not place pillow under knee, focus on keeping the knee straight while resting. CPM instructions: 0-90 degrees, 2 hours in the morning, 2 hours in the afternoon, and 2 hours in the evening. Place foam block, curve side up under heel at all times except when in CPM or when walking.  DO NOT modify, tear, cut, or change the foam block in any way.  MAKE SURE YOU:  Understand these instructions.  Get help right away if you are not doing well or get worse.    Thank you for letting us be a part of your medical care team.  It is a privilege we respect greatly.  We hope these instructions will help you stay on track for a fast and full recovery!   Do not put a pillow under the knee. Place it under the heel.   Complete by:  As directed    Place gray foam block, curve side up under heel at all times except when in CPM or when walking.  DO NOT modify, tear, cut, or change in any way the gray foam block.   Increase activity slowly as tolerated   Complete by:  As directed    Patient may shower   Complete by:  As directed    Aquacel dressing is water proof    Wash over it and the whole leg with soap and water at the end of your shower   TED hose   Complete by:  As directed    Use stockings (TED hose) for 2 weeks on both leg(s).  You may remove them at night for sleeping.      Follow-up Information    [name], MD Follow up on 07/13/2017.   Specialty:  Orthopedic Surgery Why:  appt in [place]   at 3:10 pm with Dr Leotis Pain information: [address] [name] [address] [place] Follow up on 07/01/2017.   Specialty:  Rehabilitation Why:  appt time 2:15 Thursday  Contact information: [address] [id number] [place] [phone]           Signed: Linda Hedges 06/30/2017, 6:53 AM

## 2017-06-30 NOTE — Progress Notes (Signed)
Discharge instructions, RX's and follow up appts explained and provided to patient and wife verbalized understanding. Patient left floor via wheelchair accompanied by staff.  Glenyce Randle Lynn, RN  

## 2017-07-01 ENCOUNTER — Ambulatory Visit (HOSPITAL_COMMUNITY): Payer: Medicare Other | Admitting: Physical Therapy

## 2017-07-01 ENCOUNTER — Encounter (HOSPITAL_COMMUNITY): Payer: Self-pay | Admitting: Physical Therapy

## 2017-07-01 DIAGNOSIS — R2681 Unsteadiness on feet: Secondary | ICD-10-CM

## 2017-07-01 DIAGNOSIS — R262 Difficulty in walking, not elsewhere classified: Secondary | ICD-10-CM

## 2017-07-01 DIAGNOSIS — M6281 Muscle weakness (generalized): Secondary | ICD-10-CM

## 2017-07-01 DIAGNOSIS — M25561 Pain in right knee: Secondary | ICD-10-CM

## 2017-07-01 DIAGNOSIS — R600 Localized edema: Secondary | ICD-10-CM

## 2017-07-01 NOTE — Therapy (Signed)
[place], [name], [address] Phone: [phone]   Fax:  [phone]  Physical Therapy Evaluation  Patient Details  Name: Spencer [name] MRN: [id number] Date of Birth: [birth year] Referring Provider: Matthew Saras    Encounter Date: 07/01/2017  PT End of Session - 07/01/17 1519    Visit Number  1    Number of Visits  19    Date for PT Re-Evaluation  07/22/17    Authorization Type  [employer] Medicare     Authorization Time Period  07/01/17 to 08/12/16    Authorization - Visit Number  1    Authorization - Number of Visits  10    PT Start Time  1430    PT Stop Time  1513    PT Time Calculation (min)  43 min    Equipment Utilized During Treatment  Gait belt    Activity Tolerance  Patient tolerated treatment well    Behavior During Therapy  [facility] for tasks assessed/performed       Past Medical History:  Diagnosis Date  . 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 Fracture    ribs/ right wrist x3/ left ankle/cracked vertebrae-cervical/  . GERD (gastroesophageal reflux disease)   . Primary localized osteoarthritis of left knee   . Primary localized osteoarthritis of right knee     Past Surgical History:  Procedure Laterality Date  . BASAL CELL CARCINOMA EXCISION    . COLONOSCOPY  07/04/2004   RMR: Diminutive rectal polyps, removed with cold biopsy forceps, otherwise normal/  Minimal left-sided diverticula, the remainder of the colonic mucosa normal. Hyerplastic  . COLONOSCOPY N/A 03/27/2013   Dr.Rourk-prominent internal hemorrhoids o/w normal rectum. scattered sigmoid diverticula with associated areas of submucosal petechiae. two 64mm diminutive polypoid lesions opposite the ileocecal valve the remainder of the colonic mucosa appeared normal. bx= tubular adenoma, benign hyperemic colorectal mucosa with focal active colitis  . COLONOSCOPY N/A 05/26/2017   Procedure: COLONOSCOPY;  Surgeon: [name], MD;  [facility];  Service: Endoscopy;  Laterality: N/A;  7:30am  . FOOT SURGERY Left    X 2-otif  . FOOT SURGERY Right 2003  . HEMORRHOID BANDING    . KNEE SURGERY Left 1973   open  . OLECRANON BURSECTOMY Right 03/29/2014   Procedure: EXCISION OF RIGHT OLECRANON BURSA;  Surgeon: [name], MD;  [facility];  Service: Orthopedics;  Laterality: Right;  . TOTAL KNEE ARTHROPLASTY Left 06/29/2016   Procedure: LEFT TOTAL KNEE ARTHROPLASTY;  Surgeon: [name], MD;  Location: [place];  Service: Orthopedics;  Laterality: Left;  . TOTAL KNEE ARTHROPLASTY Right 06/28/2017   Procedure: TOTAL KNEE ARTHROPLASTY;  Surgeon: [name], MD;  Location: [place];  Service: Orthopedics;  Laterality: Right;    There were no vitals filed for this visit.   Subjective Assessment - 07/01/17 1433    Subjective  Patient arrives stating he had his R knee replaced on 06/28/17 by Dr. Noemi Chapel; he is not getting HHPT and opted to get OP PT. It is hard for him to move the knee in terms of flexibilty and mobility of the knee; his balance has been good. His left knee has felt great, he has not had any pain it whatsoever. He can do stairs OK right now.     How long can you sit comfortably?  if he can stretch his R LE out, indefinitely; if bent, immediate discomfort  How long can you stand comfortably?  5-10 minutes     How long can you walk comfortably?  has not done this  a lot, household distances are OK     Patient Stated Goals  get knee just as good as the left     Currently in Pain?  No/denies at worst, "20/10"         [facility] PT Assessment - 07/01/17 0001      Assessment   Medical Diagnosis  s/p R TKR     Referring Provider  Kirstin Shepperson     Onset Date/Surgical Date  06/28/17    Next MD Visit  Shepperson in 2 weeks    Prior Therapy  PT last year for L knee       Precautions   Precautions  None      Restrictions   Weight Bearing Restrictions  No      Balance  Screen   Has the patient fallen in the past 6 months  No    Has the patient had a decrease in activity level because of a fear of falling?   No    Is the patient reluctant to leave their home because of a fear of falling?   No      Prior Function   Level of Independence  Independent;Independent with basic ADLs;Independent with gait;Independent with transfers    Vocation  Retired    Leisure  fish, hunt, play golf, workout, gardening, play with grandkids       Observation/Other Assessments   Observations  localized edema typical for post-op state       ROM / Strength   AROM / PROM / Strength  AROM;Strength      AROM   Right Knee Extension  10    Right Knee Flexion  80      Strength   Right Hip Flexion  4+/5    Right Hip Extension  3+/5    Right Hip ABduction  4/5    Left Hip Flexion  4+/5    Left Hip Extension  3/5    Left Hip ABduction  5/5    Right Knee Flexion  4/5    Right Knee Extension  4/5    Left Knee Flexion  5/5    Left Knee Extension  5/5    Right Ankle Dorsiflexion  5/5    Left Ankle Dorsiflexion  5/5      Ambulation/Gait   Gait Comments  antalgic pattern, reducd gait speed, reduced heel toe pattern, reduced foot dorsiflexion, flat foot stance R foot      6 minute walk test results    Aerobic Endurance Distance Walked  432    Endurance additional comments  3MWT no device              Objective measurements completed on examination: See above findings.              PT Education - 07/01/17 1518    Education provided  Yes    Education Details  exam findings, POC, HEP, prognosis     Person(s) Educated  Patient;Spouse    Methods  Explanation;Handout    Comprehension  Verbalized understanding;Need further instruction       PT Short Term Goals - 07/01/17 1523      PT SHORT TERM GOAL #1   Title  Patient to demonstrate 0-120 degrees R knee in order to improve gait pattern and reduce pain R knee  Time  3    Period  Weeks    Status  New     Target Date  07/22/17      PT SHORT TERM GOAL #2   Title  Patient to be able to complete TUG test in 10 seconds or less with no device in order to show improved gait and balance     Time  3    Period  Weeks    Status  New      PT SHORT TERM GOAL #3   Title  Patient to be ambulatory with SPC and consistent heel toe pattern in order to show improved mobility and community access     Time  3    Period  Weeks    Status  New      PT SHORT TERM GOAL #4   Title  Patient to be compliant with appropriate HEP, to be updated as appropriate     Time  1    Period  Weeks    Status  New    Target Date  07/08/17        PT Long Term Goals - 07/01/17 1527      PT LONG TERM GOAL #1   Title  Patient to demonstrate MMT as being 5/5 in all tested groups in order to show improved knee strength and improve gait and overall mobiltiy     Time  6    Period  Weeks    Status  New    Target Date  08/12/17      PT LONG TERM GOAL #2   Title  Patient to report he has been able to successfully complete gardening based tasks and play with grandchildren with no increase in R knee pain in order to facilitate return to PLOF     Time  6    Period  Weeks    Status  New      PT LONG TERM GOAL #3   Title  Paitent to be able to ambulate at least 835ft during 3MWT, no device, and will be ambulatory in community without device in order to show improved mobilty and facilitate return to PLOF     Time  6    Period  Weeks    Status  New      PT LONG TERM GOAL #4   Title  Patient to be able to reciprocally ascend/descend full flight of stairs with no railing, minimal unsteadiness, good eccentric control in order to improve home and community access     Time  6    Period  Weeks    Status  New             Plan - 07/01/17 1520    Clinical Impression Statement  Patient arrives 3 days following R TKR surgery; he reports things are going well, his main concern is mobility and flexibility in his R knee now.  Examination is typical for post-op TKR, including significant localized edema, limited R knee ROM, gait impairment, functional strength deficit, and mild balance deficit. Recommend skilled PT services to address functional deficits and assist in return to optimal level of function.     History and Personal Factors relevant to plan of care:  success with TKR on L     Clinical Presentation  Stable    Clinical Presentation due to:  post-op status     Clinical Decision Making  Low    Rehab Potential  Excellent    Clinical Impairments  Affecting Rehab Potential  (+) success with PT in the past, supportive spouse, very motivated     PT Frequency  3x / week    PT Duration  6 weeks    PT Treatment/Interventions  ADLs/Self Care Home Management;Cryotherapy;DME Instruction;Gait training;Stair training;Functional mobility training;Therapeutic activities;Therapeutic exercise;Balance training;Neuromuscular re-education;Patient/family education;Manual techniques;Scar mobilization;Passive range of motion;Dry needling;Taping    PT Next Visit Plan  review initial eval/goals, HEP; focus on edema control and ROM, postpone strength training until ROM is at least 0-115; gait and balance     PT Home Exercise Plan  Eval: quad sets, heel slides, seated knee flexion stretch, zero knee, heel toe pattern     Consulted and Agree with Plan of Care  Patient       Patient will benefit from skilled therapeutic intervention in order to improve the following deficits and impairments:  Abnormal gait, Decreased skin integrity, Pain, Decreased coordination, Decreased mobility, Decreased scar mobility, Increased muscle spasms, Decreased range of motion, Decreased strength, Hypomobility, Decreased balance, Difficulty walking, Impaired flexibility, Increased edema  Visit Diagnosis: Acute pain of right knee - Plan: PT plan of care cert/re-cert  Localized edema - Plan: PT plan of care cert/re-cert  Difficulty in walking, not elsewhere  classified - Plan: PT plan of care cert/re-cert  Muscle weakness (generalized) - Plan: PT plan of care cert/re-cert  Unsteadiness on feet - Plan: PT plan of care cert/re-cert  G-Codes - 67/59/16 1533    Functional Assessment Tool Used (Outpatient Only)  Based on skilled clinical assessment of balance, ROM, edema, gait, strength     Functional Limitation  Mobility: Walking and moving around    Mobility: Walking and Moving Around Current Status (B8466)  At least 40 percent but less than 60 percent impaired, limited or restricted    Mobility: Walking and Moving Around Goal Status (Z9935)  At least 20 percent but less than 40 percent impaired, limited or restricted        Problem List Patient Active Problem List   Diagnosis Date Noted  . Primary localized osteoarthritis of right knee   . Primary localized osteoarthritis of left knee   . Rectal bleeding 03/09/2013    Deniece Ree PT, DPT, CBIS  Supplemental Physical Therapist [place] [address] [place], [name], [address] Phone: [phone]   Fax:  [phone]  Name: Spencer [name] MRN: [id number] Date of Birth: [birth year]

## 2017-07-01 NOTE — Patient Instructions (Signed)
   QUAD SET  Tighten your top thigh muscle as you attempt to press the back of your knee downward towards the table.  Hold for 3-5 seconds.  Repeat 15-20 times, 3-5 times per day.     HEEL SLIDES - LONG SIT WITH TOWEL AND BELT  While in a sitting position, place a small hand towel under your heel. Next, loop a belt, towel or bed sheet around your foot and pull your knee into a bend position as your foot slides towards your buttock. Hold a gentle stretch and then return back to original position.  Repeat 15-20 times, 3-5 times per day.     HEEL SLIDES - SELF ASSISTED  While seated, slide your heel towards your buttock with the assist of the unaffected leg.  Hold for 3-5 seconds, then relax.  Repeat 10-15 times, 3-5 times per day.     ALSO:  1) Zero Knee- you can start by holding this for 2-3 minutes, multiple times per day, and add time as this begins to feel more comfortable. Ideally you will be able to eventually stay in the zero knee for up to 30 minutes.  2) Heel-toe walking- it is OK to move slower to make sure you are getting your technique down. Use the walker until you are able to walk without a limp.

## 2017-07-05 ENCOUNTER — Ambulatory Visit (HOSPITAL_COMMUNITY): Payer: Medicare Other | Attending: Physician Assistant | Admitting: Physical Therapy

## 2017-07-05 DIAGNOSIS — M25661 Stiffness of right knee, not elsewhere classified: Secondary | ICD-10-CM | POA: Diagnosis present

## 2017-07-05 DIAGNOSIS — R2681 Unsteadiness on feet: Secondary | ICD-10-CM | POA: Insufficient documentation

## 2017-07-05 DIAGNOSIS — M6281 Muscle weakness (generalized): Secondary | ICD-10-CM | POA: Diagnosis present

## 2017-07-05 DIAGNOSIS — R262 Difficulty in walking, not elsewhere classified: Secondary | ICD-10-CM | POA: Diagnosis present

## 2017-07-05 DIAGNOSIS — M25561 Pain in right knee: Secondary | ICD-10-CM | POA: Insufficient documentation

## 2017-07-05 DIAGNOSIS — R600 Localized edema: Secondary | ICD-10-CM | POA: Diagnosis present

## 2017-07-05 NOTE — Therapy (Addendum)
[place], [name], [address] Phone: [phone]   Fax:  [phone]  Physical Therapy Treatment  Patient Details  Name: Spencer [name] MRN: [id number] Date of Birth: February 10, 1946 Referring Provider: Matthew Saras    Encounter Date: 07/05/2017  PT End of Session - 07/05/17 1318    Visit Number  2    Number of Visits  19    Date for PT Re-Evaluation  07/22/17    Authorization Type  [employer] Medicare     Authorization Time Period  07/01/17 to 08/12/16    Authorization - Visit Number  2    Authorization - Number of Visits  10    PT Start Time  3419    PT Stop Time  1120    PT Time Calculation (min)  45 min    Equipment Utilized During Treatment  Gait belt    Activity Tolerance  Patient tolerated treatment well    Behavior During Therapy  [facility] for tasks assessed/performed       Past Medical History:  Diagnosis Date  . 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 Fracture    ribs/ right wrist x3/ left ankle/cracked vertebrae-cervical/  . GERD (gastroesophageal reflux disease)   . Primary localized osteoarthritis of left knee   . Primary localized osteoarthritis of right knee     Past Surgical History:  Procedure Laterality Date  . BASAL CELL CARCINOMA EXCISION    . COLONOSCOPY  07/04/2004   RMR: Diminutive rectal polyps, removed with cold biopsy forceps, otherwise normal/  Minimal left-sided diverticula, the remainder of the colonic mucosa normal. Hyerplastic  . COLONOSCOPY N/A 03/27/2013   Dr.Rourk-prominent internal hemorrhoids o/w normal rectum. scattered sigmoid diverticula with associated areas of submucosal petechiae. two 74mm diminutive polypoid lesions opposite the ileocecal valve the remainder of the colonic mucosa appeared normal. bx= tubular adenoma, benign hyperemic colorectal mucosa with focal active colitis  . COLONOSCOPY N/A 05/26/2017   Procedure: COLONOSCOPY;  Surgeon: [name], MD;  [facility];  Service: Endoscopy;  Laterality: N/A;  7:30am  . FOOT SURGERY Left    X 2-otif  . FOOT SURGERY Right 2003  . HEMORRHOID BANDING    . KNEE SURGERY Left 1973   open  . OLECRANON BURSECTOMY Right 03/29/2014   Procedure: EXCISION OF RIGHT OLECRANON BURSA;  Surgeon: [name], MD;  [facility];  Service: Orthopedics;  Laterality: Right;  . TOTAL KNEE ARTHROPLASTY Left 06/29/2016   Procedure: LEFT TOTAL KNEE ARTHROPLASTY;  Surgeon: [name], MD;  Location: [place];  Service: Orthopedics;  Laterality: Left;  . TOTAL KNEE ARTHROPLASTY Right 06/28/2017   Procedure: TOTAL KNEE ARTHROPLASTY;  Surgeon: [name], MD;  Location: [place];  Service: Orthopedics;  Laterality: Right;    There were no vitals filed for this visit.  Subjective Assessment - 07/05/17 1035    Subjective  Pt states he is just sore, not really hurting today.  States he's up to 2 minutes on the zero knee at home.  REports compliance with HEP.    Currently in Pain?  No/denies                      [facility] Adult PT Treatment/Exercise - 07/05/17 0001      Knee/Hip Exercises: Stretches   Active Hamstring Stretch  Right;3 reps;30 seconds    Active Hamstring Stretch Limitations  12" step  Knee: Self-Stretch to increase Flexion  Right;10 seconds    Knee: Self-Stretch Limitations  10 reps on 12" step    Gastroc Stretch  Both;3 reps;30 seconds    Gastroc Stretch Limitations  slant board      Knee/Hip Exercises: Aerobic   Stationary Bike  4 minutes rocking      Knee/Hip Exercises: Supine   Quad Sets  Right;10 reps    Short Arc Quad Sets  Right;10 reps    Heel Slides  Right;10 reps      Manual Therapy   Manual Therapy  Edema management;Soft tissue mobilization    Manual therapy comments  Manual complete separate rest of tx    Edema Management  retro massage    Soft tissue mobilization  to improve mobility of tissue             PT Education -  07/05/17 1321    Education provided  Yes    Education Details  review of HEP and goals per initial evaluation.     Person(s) Educated  Patient;Spouse    Methods  Explanation;Demonstration;Tactile cues;Verbal cues;Handout    Comprehension  Verbalized understanding;Verbal cues required;Returned demonstration;Tactile cues required;Need further instruction       PT Short Term Goals - 07/01/17 1523      PT SHORT TERM GOAL #1   Title  Patient to demonstrate 0-120 degrees R knee in order to improve gait pattern and reduce pain R knee     Time  3    Period  Weeks    Status  New    Target Date  07/22/17      PT SHORT TERM GOAL #2   Title  Patient to be able to complete TUG test in 10 seconds or less with no device in order to show improved gait and balance     Time  3    Period  Weeks    Status  New      PT SHORT TERM GOAL #3   Title  Patient to be ambulatory with SPC and consistent heel toe pattern in order to show improved mobility and community access     Time  3    Period  Weeks    Status  New      PT SHORT TERM GOAL #4   Title  Patient to be compliant with appropriate HEP, to be updated as appropriate     Time  1    Period  Weeks    Status  New    Target Date  07/08/17        PT Long Term Goals - 07/01/17 1527      PT LONG TERM GOAL #1   Title  Patient to demonstrate MMT as being 5/5 in all tested groups in order to show improved knee strength and improve gait and overall mobiltiy     Time  6    Period  Weeks    Status  New    Target Date  08/12/17      PT LONG TERM GOAL #2   Title  Patient to report he has been able to successfully complete gardening based tasks and play with grandchildren with no increase in R knee pain in order to facilitate return to PLOF     Time  6    Period  Weeks    Status  New      PT LONG TERM GOAL #3   Title  Paitent to be able to ambulate  at least 827ft during 3MWT, no device, and will be ambulatory in community without device in order  to show improved mobilty and facilitate return to PLOF     Time  6    Period  Weeks    Status  New      PT LONG TERM GOAL #4   Title  Patient to be able to reciprocally ascend/descend full flight of stairs with no railing, minimal unsteadiness, good eccentric control in order to improve home and community access     Time  6    Period  Weeks    Status  New            Plan - 07/05/17 1318    Clinical Impression Statement  REviewed HEP adn goals per intial evaluation.  Began session with 4 minutes on bike to warm up/ROM.  Added standing stretches and continued with established therex.  Added manual to help decrease tightness/adhesions and edema perimeter of knee.  ROM remained 10-80 this session.      Rehab Potential  Excellent    Clinical Impairments Affecting Rehab Potential  (+) success with PT in the past, supportive spouse, very motivated     PT Frequency  3x / week    PT Duration  6 weeks    PT Treatment/Interventions  ADLs/Self Care Home Management;Cryotherapy;DME Instruction;Gait training;Stair training;Functional mobility training;Therapeutic activities;Therapeutic exercise;Balance training;Neuromuscular re-education;Patient/family education;Manual techniques;Scar mobilization;Passive range of motion;Dry needling;Taping    PT Next Visit Plan  focus on edema control and ROM, postpone strength training until ROM is at least 0-115; gait and balance.       PT Home Exercise Plan  Eval: quad sets, heel slides, seated knee flexion stretch, zero knee, heel toe pattern     Consulted and Agree with Plan of Care  Patient       Patient will benefit from skilled therapeutic intervention in order to improve the following deficits and impairments:  Abnormal gait, Decreased skin integrity, Pain, Decreased coordination, Decreased mobility, Decreased scar mobility, Increased muscle spasms, Decreased range of motion, Decreased strength, Hypomobility, Decreased balance, Difficulty walking, Impaired  flexibility, Increased edema  Visit Diagnosis: Acute pain of right knee  Localized edema  Difficulty in walking, not elsewhere classified     Problem List Patient Active Problem List   Diagnosis Date Noted  . Primary localized osteoarthritis of right knee   . Primary localized osteoarthritis of left knee   . Rectal bleeding 03/09/2013   Teena Irani, PTA/CLT [phone]  Teena Irani 07/05/2017, 1:42 PM  [place] [address] [place], [name], [address] Phone: [phone]   Fax:  [phone]  Name: AWESOME JARED MRN: [id number] Date of Birth: 1946-03-26

## 2017-07-07 ENCOUNTER — Ambulatory Visit (HOSPITAL_COMMUNITY): Payer: Medicare Other | Admitting: Physical Therapy

## 2017-07-07 DIAGNOSIS — R600 Localized edema: Secondary | ICD-10-CM

## 2017-07-07 DIAGNOSIS — M25561 Pain in right knee: Secondary | ICD-10-CM | POA: Diagnosis not present

## 2017-07-07 DIAGNOSIS — R262 Difficulty in walking, not elsewhere classified: Secondary | ICD-10-CM

## 2017-07-07 NOTE — Therapy (Signed)
[place], [name], [address] Phone: [phone]   Fax:  [phone]  Physical Therapy Treatment  Patient Details  Name: Spencer [name] MRN: [id number] Date of Birth: April 25, 1946 Referring Provider: Matthew Saras    Encounter Date: 07/07/2017  PT End of Session - 07/07/17 1748    Visit Number  3    Number of Visits  19    Date for PT Re-Evaluation  07/22/17    Authorization Type  [employer] Medicare     Authorization Time Period  07/01/17 to 08/12/16    Authorization - Visit Number  3    Authorization - Number of Visits  10    PT Start Time  1300    PT Stop Time  1345    PT Time Calculation (min)  45 min    Equipment Utilized During Treatment  Gait belt    Activity Tolerance  Patient tolerated treatment well    Behavior During Therapy  [facility] for tasks assessed/performed       Past Medical History:  Diagnosis Date  . 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 Fracture    ribs/ right wrist x3/ left ankle/cracked vertebrae-cervical/  . GERD (gastroesophageal reflux disease)   . Primary localized osteoarthritis of left knee   . Primary localized osteoarthritis of right knee     Past Surgical History:  Procedure Laterality Date  . BASAL CELL CARCINOMA EXCISION    . COLONOSCOPY  07/04/2004   RMR: Diminutive rectal polyps, removed with cold biopsy forceps, otherwise normal/  Minimal left-sided diverticula, the remainder of the colonic mucosa normal. Hyerplastic  . COLONOSCOPY N/A 03/27/2013   Dr.Rourk-prominent internal hemorrhoids o/w normal rectum. scattered sigmoid diverticula with associated areas of submucosal petechiae. two 56mm diminutive polypoid lesions opposite the ileocecal valve the remainder of the colonic mucosa appeared normal. bx= tubular adenoma, benign hyperemic colorectal mucosa with focal active colitis  . COLONOSCOPY N/A 05/26/2017   Procedure: COLONOSCOPY;  Surgeon: [name], MD;  [facility];  Service: Endoscopy;  Laterality: N/A;  7:30am  . FOOT SURGERY Left    X 2-otif  . FOOT SURGERY Right 2003  . HEMORRHOID BANDING    . KNEE SURGERY Left 1973   open  . OLECRANON BURSECTOMY Right 03/29/2014   Procedure: EXCISION OF RIGHT OLECRANON BURSA;  Surgeon: [name], MD;  Location: [facility];  Service: Orthopedics;  Laterality: Right;  . TOTAL KNEE ARTHROPLASTY Left 06/29/2016   Procedure: LEFT TOTAL KNEE ARTHROPLASTY;  Surgeon: [name], MD;  Location: [place];  Service: Orthopedics;  Laterality: Left;  . TOTAL KNEE ARTHROPLASTY Right 06/28/2017   Procedure: TOTAL KNEE ARTHROPLASTY;  Surgeon: [name], MD;  Location: [place];  Service: Orthopedics;  Laterality: Right;    There were no vitals filed for this visit.  Subjective Assessment - 07/07/17 1317    Subjective  PT states he's now up to 3 minutes on the zero knee at home.  Soreness persists but no pain.    Currently in Pain?  No/denies                      [facility] Adult PT Treatment/Exercise - 07/07/17 0001      Knee/Hip Exercises: Stretches   Active Hamstring Stretch  Right;3 reps;30 seconds    Active Hamstring Stretch Limitations  12" step    Knee: Self-Stretch to increase Flexion  Right;10 seconds  Knee: Self-Stretch Limitations  10 reps on 12" step    Gastroc Stretch  Both;3 reps;30 seconds    Gastroc Stretch Limitations  slant board      Knee/Hip Exercises: Aerobic   Stationary Bike  4 minutes rocking      Knee/Hip Exercises: Standing   Heel Raises  10 reps    Knee Flexion  Right;10 reps    Terminal Knee Extension  Right;10 reps;Theraband BTB      Knee/Hip Exercises: Supine   Quad Sets  Right;10 reps    Short Arc Quad Sets  Right;10 reps    Heel Slides  Right;10 reps    Knee Extension  Limitations    Knee Extension Limitations  8    Knee Flexion  Limitations    Knee Flexion Limitations  85      Knee/Hip Exercises: Prone   Hamstring Curl  10  reps    Other Prone Exercises  quad set 10 reps      Manual Therapy   Manual Therapy  Edema management;Soft tissue mobilization    Manual therapy comments  Manual complete separate rest of tx    Edema Management  retro massage    Soft tissue mobilization  to improve mobility of tissue               PT Short Term Goals - 07/01/17 1523      PT SHORT TERM GOAL #1   Title  Patient to demonstrate 0-120 degrees R knee in order to improve gait pattern and reduce pain R knee     Time  3    Period  Weeks    Status  New    Target Date  07/22/17      PT SHORT TERM GOAL #2   Title  Patient to be able to complete TUG test in 10 seconds or less with no device in order to show improved gait and balance     Time  3    Period  Weeks    Status  New      PT SHORT TERM GOAL #3   Title  Patient to be ambulatory with SPC and consistent heel toe pattern in order to show improved mobility and community access     Time  3    Period  Weeks    Status  New      PT SHORT TERM GOAL #4   Title  Patient to be compliant with appropriate HEP, to be updated as appropriate     Time  1    Period  Weeks    Status  New    Target Date  07/08/17        PT Long Term Goals - 07/01/17 1527      PT LONG TERM GOAL #1   Title  Patient to demonstrate MMT as being 5/5 in all tested groups in order to show improved knee strength and improve gait and overall mobiltiy     Time  6    Period  Weeks    Status  New    Target Date  08/12/17      PT LONG TERM GOAL #2   Title  Patient to report he has been able to successfully complete gardening based tasks and play with grandchildren with no increase in R knee pain in order to facilitate return to PLOF     Time  6    Period  Weeks    Status  New  PT LONG TERM GOAL #3   Title  Paitent to be able to ambulate at least 877ft during 3MWT, no device, and will be ambulatory in community without device in order to show improved mobilty and facilitate return to  PLOF     Time  6    Period  Weeks    Status  New      PT LONG TERM GOAL #4   Title  Patient to be able to reciprocally ascend/descend full flight of stairs with no railing, minimal unsteadiness, good eccentric control in order to improve home and community access     Time  6    Period  Weeks    Status  New            Plan - 07/07/17 1748    Clinical Impression Statement  Continued with ROM focus with today being 1 week post op.  Added TKE, standing knee flexion and prone quadsets today to help improve ROM as well.  Pt able to complete all without pain.   Manual completed to decrease tightness with adhesions mostly on lateral aspect of knee.  Edema still present, however decreased from last session.  Improved ROM 8-85 today (was 10-80 on Monday).      Rehab Potential  Excellent    Clinical Impairments Affecting Rehab Potential  (+) success with PT in the past, supportive spouse, very motivated     PT Frequency  3x / week    PT Duration  6 weeks    PT Treatment/Interventions  ADLs/Self Care Home Management;Cryotherapy;DME Instruction;Gait training;Stair training;Functional mobility training;Therapeutic activities;Therapeutic exercise;Balance training;Neuromuscular re-education;Patient/family education;Manual techniques;Scar mobilization;Passive range of motion;Dry needling;Taping    PT Next Visit Plan  focus on edema control and ROM, postpone strength training until ROM is at least 0-115; gait and balance.       PT Home Exercise Plan  Eval: quad sets, heel slides, seated knee flexion stretch, zero knee, heel toe pattern     Consulted and Agree with Plan of Care  Patient       Patient will benefit from skilled therapeutic intervention in order to improve the following deficits and impairments:  Abnormal gait, Decreased skin integrity, Pain, Decreased coordination, Decreased mobility, Decreased scar mobility, Increased muscle spasms, Decreased range of motion, Decreased strength,  Hypomobility, Decreased balance, Difficulty walking, Impaired flexibility, Increased edema  Visit Diagnosis: Acute pain of right knee  Localized edema  Difficulty in walking, not elsewhere classified     Problem List Patient Active Problem List   Diagnosis Date Noted  . Primary localized osteoarthritis of right knee   . Primary localized osteoarthritis of left knee   . Rectal bleeding 03/09/2013   Teena Irani, PTA/CLT [phone]  Teena Irani 07/07/2017, 5:55 PM  [place] [address] [place], [name], [address] Phone: [phone]   Fax:  [phone]  Name: Spencer [name] MRN: [id number] Date of Birth: [birth year]

## 2017-07-08 ENCOUNTER — Telehealth (HOSPITAL_COMMUNITY): Payer: Self-pay

## 2017-07-08 NOTE — Telephone Encounter (Signed)
07/08/17  pt called and said he wouldn't be at this appt but no reason given

## 2017-07-09 ENCOUNTER — Ambulatory Visit (HOSPITAL_COMMUNITY): Payer: Medicare Other

## 2017-07-12 ENCOUNTER — Ambulatory Visit (HOSPITAL_COMMUNITY): Payer: Medicare Other | Admitting: Physical Therapy

## 2017-07-14 ENCOUNTER — Other Ambulatory Visit: Payer: Self-pay

## 2017-07-14 ENCOUNTER — Encounter (HOSPITAL_COMMUNITY): Payer: Self-pay

## 2017-07-14 ENCOUNTER — Ambulatory Visit (HOSPITAL_COMMUNITY): Payer: Medicare Other

## 2017-07-14 DIAGNOSIS — R262 Difficulty in walking, not elsewhere classified: Secondary | ICD-10-CM

## 2017-07-14 DIAGNOSIS — M25561 Pain in right knee: Secondary | ICD-10-CM | POA: Diagnosis not present

## 2017-07-14 DIAGNOSIS — M6281 Muscle weakness (generalized): Secondary | ICD-10-CM

## 2017-07-14 DIAGNOSIS — R600 Localized edema: Secondary | ICD-10-CM

## 2017-07-14 NOTE — Therapy (Signed)
[place], [name], [address] Phone: [phone]   Fax:  [phone]  Physical Therapy Treatment  Patient Details  Name: Spencer [name] MRN: [id number] Date of Birth: 08/11/1945 Referring Provider: Matthew Saras    Encounter Date: 07/14/2017  PT End of Session - 07/14/17 1041    Visit Number  4    Number of Visits  19    Date for PT Re-Evaluation  07/22/17    Authorization Type  [employer] Medicare     Authorization Time Period  07/01/17 to 08/12/16    Authorization - Visit Number  4    Authorization - Number of Visits  10    PT Start Time  0946    PT Stop Time  1028    PT Time Calculation (min)  42 min    Activity Tolerance  Patient tolerated treatment well    Behavior During Therapy  [facility] for tasks assessed/performed       Past Medical History:  Diagnosis Date  . 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 Fracture    ribs/ right wrist x3/ left ankle/cracked vertebrae-cervical/  . GERD (gastroesophageal reflux disease)   . Primary localized osteoarthritis of left knee   . Primary localized osteoarthritis of right knee     Past Surgical History:  Procedure Laterality Date  . BASAL CELL CARCINOMA EXCISION    . COLONOSCOPY  07/04/2004   RMR: Diminutive rectal polyps, removed with cold biopsy forceps, otherwise normal/  Minimal left-sided diverticula, the remainder of the colonic mucosa normal. Hyerplastic  . COLONOSCOPY N/A 03/27/2013   Dr.Rourk-prominent internal hemorrhoids o/w normal rectum. scattered sigmoid diverticula with associated areas of submucosal petechiae. two 2mm diminutive polypoid lesions opposite the ileocecal valve the remainder of the colonic mucosa appeared normal. bx= tubular adenoma, benign hyperemic colorectal mucosa with focal active colitis  . COLONOSCOPY N/A 05/26/2017   Procedure: COLONOSCOPY;  Surgeon: [name], MD;  [facility];  Service:  Endoscopy;  Laterality: N/A;  7:30am  . FOOT SURGERY Left    X 2-otif  . FOOT SURGERY Right 2003  . HEMORRHOID BANDING    . KNEE SURGERY Left 1973   open  . OLECRANON BURSECTOMY Right 03/29/2014   Procedure: EXCISION OF RIGHT OLECRANON BURSA;  Surgeon: [name], MD;  [facility];  Service: Orthopedics;  Laterality: Right;  . TOTAL KNEE ARTHROPLASTY Left 06/29/2016   Procedure: LEFT TOTAL KNEE ARTHROPLASTY;  Surgeon: [name], MD;  Location: [place];  Service: Orthopedics;  Laterality: Left;  . TOTAL KNEE ARTHROPLASTY Right 06/28/2017   Procedure: TOTAL KNEE ARTHROPLASTY;  Surgeon: [name], MD;  Location: [place];  Service: Orthopedics;  Laterality: Right;    There were no vitals filed for this visit.  Subjective Assessment - 07/14/17 0951    Subjective  Patient reports not a lot of pain but says at times it can be a 3-4. Patient states he experienced soreness after manual therapy last session lasting 3 days.     How long can you sit comfortably?  if he can stretch his R LE out, indefinitely; if bent, immediate discomfort     How long can you stand comfortably?  5-10 minutes     How long can you walk comfortably?  has not done this  a lot, household distances are OK     Patient Stated Goals  get knee just as good as the left  Currently in Pain?  Yes    Pain Score  4     Pain Location  Knee    Pain Orientation  Right    Pain Descriptors / Indicators  Aching                      [facility] Adult PT Treatment/Exercise - 07/14/17 0001      Ambulation/Gait   Gait Comments  antalgic pattern, reducd gait speed, reduced heel toe pattern, reduced foot dorsiflexion, flat foot stance R foot      Knee/Hip Exercises: Stretches   Active Hamstring Stretch  Right;3 reps;30 seconds    Active Hamstring Stretch Limitations  12" step    Knee: Self-Stretch to increase Flexion  Right;10 seconds    Knee: Self-Stretch Limitations  10 reps on 12" step    Gastroc Stretch   Both;3 reps;30 seconds    Gastroc Stretch Limitations  slant board      Knee/Hip Exercises: Aerobic   Stationary Bike  4 minutes rocking      Knee/Hip Exercises: Standing   Heel Raises  10 reps      Knee/Hip Exercises: Supine   Quad Sets  Right;10 reps    Short Arc Quad Sets  Right;10 reps    Heel Slides  Right;10 reps;2 sets    Knee Extension  Limitations    Knee Extension Limitations  7    Knee Flexion  Limitations    Knee Flexion Limitations  84    Other Supine Knee/Hip Exercises  Hip abduction supine x 10    Other Supine Knee/Hip Exercises  Ankle pumps 2x10 To assist with edema reduction      Knee/Hip Exercises: Prone   Hamstring Curl  10 reps    Prone Knee Hang  1 minute      Manual Therapy   Manual Therapy  Edema management;Soft tissue mobilization    Manual therapy comments  Manual complete separate rest of tx    Edema Management  Light massage in distal to proximal direction to assist with edema reduction             PT Education - 07/14/17 1040    Education provided  Yes    Education Details  Throughout session instructed patient on exercise goals and discussed HEP.    Person(s) Educated  Patient    Methods  Explanation    Comprehension  Verbalized understanding       PT Short Term Goals - 07/01/17 1523      PT SHORT TERM GOAL #1   Title  Patient to demonstrate 0-120 degrees R knee in order to improve gait pattern and reduce pain R knee     Time  3    Period  Weeks    Status  New    Target Date  07/22/17      PT SHORT TERM GOAL #2   Title  Patient to be able to complete TUG test in 10 seconds or less with no device in order to show improved gait and balance     Time  3    Period  Weeks    Status  New      PT SHORT TERM GOAL #3   Title  Patient to be ambulatory with SPC and consistent heel toe pattern in order to show improved mobility and community access     Time  3    Period  Weeks    Status  New  PT SHORT TERM GOAL #4   Title   Patient to be compliant with appropriate HEP, to be updated as appropriate     Time  1    Period  Weeks    Status  New    Target Date  07/08/17        PT Long Term Goals - 07/01/17 1527      PT LONG TERM GOAL #1   Title  Patient to demonstrate MMT as being 5/5 in all tested groups in order to show improved knee strength and improve gait and overall mobiltiy     Time  6    Period  Weeks    Status  New    Target Date  08/12/17      PT LONG TERM GOAL #2   Title  Patient to report he has been able to successfully complete gardening based tasks and play with grandchildren with no increase in R knee pain in order to facilitate return to PLOF     Time  6    Period  Weeks    Status  New      PT LONG TERM GOAL #3   Title  Paitent to be able to ambulate at least 854ft during 3MWT, no device, and will be ambulatory in community without device in order to show improved mobilty and facilitate return to PLOF     Time  6    Period  Weeks    Status  New      PT LONG TERM GOAL #4   Title  Patient to be able to reciprocally ascend/descend full flight of stairs with no railing, minimal unsteadiness, good eccentric control in order to improve home and community access     Time  6    Period  Weeks    Status  New            Plan - 07/14/17 1029    Clinical Impression Statement  Patient is doing well this session. This session initially focused on range of motion improvements. Patient performed bike for 4 minutes (time uncharged). Patient performed static and active stretches in order to improve range of motion. Range of motion for right knee was from 7-84 degrees this session. Focused on ROM a lot this session through supine exercises and edema reduction with manual therapy. Patient states pain has decreased some since beginning of session. Patient reports that he saw physician yesterday and states that he is progressing well. Patient reports continued compliance with HEP. Plan to continue with  skilled physical therapy to decrease edema and improve ROM and strength.      Rehab Potential  Excellent    Clinical Impairments Affecting Rehab Potential  (+) success with PT in the past, supportive spouse, very motivated     PT Frequency  3x / week    PT Duration  6 weeks    PT Treatment/Interventions  ADLs/Self Care Home Management;Cryotherapy;DME Instruction;Gait training;Stair training;Functional mobility training;Therapeutic activities;Therapeutic exercise;Balance training;Neuromuscular re-education;Patient/family education;Manual techniques;Scar mobilization;Passive range of motion;Dry needling;Taping    PT Next Visit Plan  focus on edema control and ROM, postpone strength training until ROM is at least 0-115; gait and balance.       PT Home Exercise Plan  Eval: quad sets, heel slides, seated knee flexion stretch, zero knee, heel toe pattern     Consulted and Agree with Plan of Care  Patient       Patient will benefit from skilled therapeutic intervention in order to  improve the following deficits and impairments:  Abnormal gait, Decreased skin integrity, Pain, Decreased coordination, Decreased mobility, Decreased scar mobility, Increased muscle spasms, Decreased range of motion, Decreased strength, Hypomobility, Decreased balance, Difficulty walking, Impaired flexibility, Increased edema  Visit Diagnosis: Acute pain of right knee  Localized edema  Difficulty in walking, not elsewhere classified  Muscle weakness (generalized)     Problem List Patient Active Problem List   Diagnosis Date Noted  . Primary localized osteoarthritis of right knee   . Primary localized osteoarthritis of left knee   . Rectal bleeding 03/09/2013    Clarene Critchley PT, DPT 10:54 AM, 07/14/17 [place] [address] [place], [name], [address] Phone: [phone]   Fax:  [phone]  Name: Spencer [name] MRN: [id number] Date of Birth:  1945-12-08

## 2017-07-16 ENCOUNTER — Other Ambulatory Visit: Payer: Self-pay

## 2017-07-16 ENCOUNTER — Ambulatory Visit (HOSPITAL_COMMUNITY): Payer: Medicare Other

## 2017-07-16 ENCOUNTER — Encounter (HOSPITAL_COMMUNITY): Payer: Self-pay

## 2017-07-16 DIAGNOSIS — M6281 Muscle weakness (generalized): Secondary | ICD-10-CM

## 2017-07-16 DIAGNOSIS — R600 Localized edema: Secondary | ICD-10-CM

## 2017-07-16 DIAGNOSIS — M25561 Pain in right knee: Secondary | ICD-10-CM | POA: Diagnosis not present

## 2017-07-16 DIAGNOSIS — R262 Difficulty in walking, not elsewhere classified: Secondary | ICD-10-CM

## 2017-07-16 NOTE — Therapy (Signed)
[place], [name], [address] Phone: [phone]   Fax:  [phone]  Physical Therapy Treatment  Patient Details  Name: Spencer [name] MRN: [id number] Date of Birth: June 02, 1946 Referring Provider: Matthew Saras    Encounter Date: 07/16/2017  PT End of Session - 07/16/17 1128    Visit Number  5    Number of Visits  19    Date for PT Re-Evaluation  07/22/17    Authorization Type  [employer] Medicare     Authorization Time Period  07/01/17 to 08/12/16    Authorization - Visit Number  5    Authorization - Number of Visits  10    PT Start Time  1033    PT Stop Time  1120    PT Time Calculation (min)  47 min    Activity Tolerance  Patient tolerated treatment well    Behavior During Therapy  [facility] for tasks assessed/performed       Past Medical History:  Diagnosis Date  . 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 Fracture    ribs/ right wrist x3/ left ankle/cracked vertebrae-cervical/  . GERD (gastroesophageal reflux disease)   . Primary localized osteoarthritis of left knee   . Primary localized osteoarthritis of right knee     Past Surgical History:  Procedure Laterality Date  . BASAL CELL CARCINOMA EXCISION    . COLONOSCOPY  07/04/2004   RMR: Diminutive rectal polyps, removed with cold biopsy forceps, otherwise normal/  Minimal left-sided diverticula, the remainder of the colonic mucosa normal. Hyerplastic  . COLONOSCOPY N/A 03/27/2013   Dr.Rourk-prominent internal hemorrhoids o/w normal rectum. scattered sigmoid diverticula with associated areas of submucosal petechiae. two 40mm diminutive polypoid lesions opposite the ileocecal valve the remainder of the colonic mucosa appeared normal. bx= tubular adenoma, benign hyperemic colorectal mucosa with focal active colitis  . COLONOSCOPY N/A 05/26/2017   Procedure: COLONOSCOPY;  Surgeon: [name], MD;  [facility];  Service:  Endoscopy;  Laterality: N/A;  7:30am  . FOOT SURGERY Left    X 2-otif  . FOOT SURGERY Right 2003  . HEMORRHOID BANDING    . KNEE SURGERY Left 1973   open  . OLECRANON BURSECTOMY Right 03/29/2014   Procedure: EXCISION OF RIGHT OLECRANON BURSA;  Surgeon: [name], MD;  Location: [facility];  Service: Orthopedics;  Laterality: Right;  . TOTAL KNEE ARTHROPLASTY Left 06/29/2016   Procedure: LEFT TOTAL KNEE ARTHROPLASTY;  Surgeon: [name], MD;  Location: [place];  Service: Orthopedics;  Laterality: Left;  . TOTAL KNEE ARTHROPLASTY Right 06/28/2017   Procedure: TOTAL KNEE ARTHROPLASTY;  Surgeon: [name], MD;  Location: [place];  Service: Orthopedics;  Laterality: Right;    There were no vitals filed for this visit.  Subjective Assessment - 07/16/17 1035    Subjective  Patient states his pain level is about a 2, but that it's "not too bad".    Patient is accompained by:  Family member    Limitations  Walking    How long can you sit comfortably?  if he can stretch his R LE out, indefinitely; if bent, immediate discomfort     How long can you stand comfortably?  5-10 minutes     How long can you walk comfortably?  has not done this  a lot, household distances are OK     Patient Stated Goals  get knee just as good as the  left     Currently in Pain?  Yes    Pain Score  2     Pain Location  Knee    Pain Orientation  Right    Pain Descriptors / Indicators  Aching    Multiple Pain Sites  No                      [facility] Adult PT Treatment/Exercise - 07/16/17 0001      Ambulation/Gait   Gait Comments  antalgic pattern, reducd gait speed, reduced heel toe pattern, reduced foot dorsiflexion, flat foot stance R foot      Knee/Hip Exercises: Stretches   Active Hamstring Stretch  Right;3 reps;30 seconds    Active Hamstring Stretch Limitations  12" step    Knee: Self-Stretch to increase Flexion  Right;10 seconds    Knee: Self-Stretch Limitations  10 reps on 12" step     Gastroc Stretch  Both;3 reps;30 seconds    Gastroc Stretch Limitations  slant board      Knee/Hip Exercises: Aerobic   Stationary Bike  4 minutes rocking      Knee/Hip Exercises: Standing   Heel Raises  15 reps cueing to lower down slowly    Terminal Knee Extension  Right;AROM;10 reps Blue Theraband      Knee/Hip Exercises: Seated   Heel Slides  10 reps;Right;AROM seated at edge of mat with towel under right foot      Knee/Hip Exercises: Supine   Quad Sets  Right;10 reps Towel roll under ankle    Short Arc Quad Sets  Right;10 reps Knee on foam roll; hold for 3 seconds    Knee Extension  Limitations    Knee Extension Limitations  6    Knee Flexion  Limitations    Knee Flexion Limitations  90      Knee/Hip Exercises: Prone   Hamstring Curl  10 reps Active assisted this session    Prone Knee Hang  1 minute      Manual Therapy   Manual Therapy  Edema management;Soft tissue mobilization    Manual therapy comments  Manual complete separate rest of tx    Edema Management  Light massage in distal to proximal direction to assist with edema reduction; soft tissue massage to right hamstrings to increase relaxation    Soft tissue mobilization  to improve mobility of tissue             PT Education - 07/16/17 1127    Education provided  Yes    Education Details  Patient was educated about progress with range of motion and about purpose of manual therapy and how the patient's wife might perform some light massage to promote relaxation at home.     Person(s) Educated  Patient;Spouse    Methods  Explanation;Demonstration    Comprehension  Verbalized understanding       PT Short Term Goals - 07/01/17 1523      PT SHORT TERM GOAL #1   Title  Patient to demonstrate 0-120 degrees R knee in order to improve gait pattern and reduce pain R knee     Time  3    Period  Weeks    Status  New    Target Date  07/22/17      PT SHORT TERM GOAL #2   Title  Patient to be able to complete TUG  test in 10 seconds or less with no device in order to show improved gait and balance  Time  3    Period  Weeks    Status  New      PT SHORT TERM GOAL #3   Title  Patient to be ambulatory with SPC and consistent heel toe pattern in order to show improved mobility and community access     Time  3    Period  Weeks    Status  New      PT SHORT TERM GOAL #4   Title  Patient to be compliant with appropriate HEP, to be updated as appropriate     Time  1    Period  Weeks    Status  New    Target Date  07/08/17        PT Long Term Goals - 07/01/17 1527      PT LONG TERM GOAL #1   Title  Patient to demonstrate MMT as being 5/5 in all tested groups in order to show improved knee strength and improve gait and overall mobiltiy     Time  6    Period  Weeks    Status  New    Target Date  08/12/17      PT LONG TERM GOAL #2   Title  Patient to report he has been able to successfully complete gardening based tasks and play with grandchildren with no increase in R knee pain in order to facilitate return to PLOF     Time  6    Period  Weeks    Status  New      PT LONG TERM GOAL #3   Title  Paitent to be able to ambulate at least 864ft during 3MWT, no device, and will be ambulatory in community without device in order to show improved mobilty and facilitate return to PLOF     Time  6    Period  Weeks    Status  New      PT LONG TERM GOAL #4   Title  Patient to be able to reciprocally ascend/descend full flight of stairs with no railing, minimal unsteadiness, good eccentric control in order to improve home and community access     Time  6    Period  Weeks    Status  New            Plan - 07/16/17 1129    Clinical Impression Statement  This session continued to focus on ROM and strengthening in a pain-free range. Patient reports that he does not have a lot of pain today states 2/10 pain. Patient began on the stationary bicycle for 4 minutes (not charged).  Then progressed to  stretching of lower extremity in order to improve range of motion of the right knee. Patient states he has been focusing a lot on getting his leg straight. Patient demonstrates improved knee extension range of motion to 6 degrees and flexion range of motion to 90 degrees this session. Patient states that he does not have an increase in pain this session. Plan to continue with range of motion and strengthening in a pain-free range of right lower extremity.     Rehab Potential  Excellent    Clinical Impairments Affecting Rehab Potential  (+) success with PT in the past, supportive spouse, very motivated     PT Frequency  3x / week    PT Duration  6 weeks    PT Treatment/Interventions  ADLs/Self Care Home Management;Cryotherapy;DME Instruction;Gait training;Stair training;Functional mobility training;Therapeutic activities;Therapeutic exercise;Balance training;Neuromuscular re-education;Patient/family education;Manual techniques;Scar mobilization;Passive  range of motion;Dry needling;Taping    PT Next Visit Plan  focus on edema control and ROM, postpone strength training until ROM is at least 0-115; gait and balance.       PT Home Exercise Plan  Eval: quad sets, heel slides, seated knee flexion stretch, zero knee, heel toe pattern     Consulted and Agree with Plan of Care  Patient       Patient will benefit from skilled therapeutic intervention in order to improve the following deficits and impairments:  Abnormal gait, Decreased skin integrity, Pain, Decreased coordination, Decreased mobility, Decreased scar mobility, Increased muscle spasms, Decreased range of motion, Decreased strength, Hypomobility, Decreased balance, Difficulty walking, Impaired flexibility, Increased edema  Visit Diagnosis: Acute pain of right knee  Localized edema  Difficulty in walking, not elsewhere classified  Muscle weakness (generalized)     Problem List Patient Active Problem List   Diagnosis Date Noted  .  Primary localized osteoarthritis of right knee   . Primary localized osteoarthritis of left knee   . Rectal bleeding 03/09/2013    Clarene Critchley PT, DPT 11:38 AM, 07/16/17 [place] [address] [place], [name], [address] Phone: [phone]   Fax:  [phone]  Name: OUSMANE SEEMAN MRN: [id number] Date of Birth: 02/14/1946

## 2017-07-19 ENCOUNTER — Ambulatory Visit (HOSPITAL_COMMUNITY): Payer: Medicare Other | Admitting: Physical Therapy

## 2017-07-19 DIAGNOSIS — M25561 Pain in right knee: Secondary | ICD-10-CM

## 2017-07-19 DIAGNOSIS — M6281 Muscle weakness (generalized): Secondary | ICD-10-CM

## 2017-07-19 DIAGNOSIS — R262 Difficulty in walking, not elsewhere classified: Secondary | ICD-10-CM

## 2017-07-19 DIAGNOSIS — R600 Localized edema: Secondary | ICD-10-CM

## 2017-07-19 NOTE — Therapy (Signed)
[place], [name], [address] Phone: [phone]   Fax:  [phone]  Physical Therapy Treatment  Patient Details  Name: Spencer [name] MRN: [id number] Date of Birth: 1945-10-30 Referring Provider: Matthew Saras    Encounter Date: 07/19/2017  PT End of Session - 07/19/17 1155    Visit Number  6    Number of Visits  19    Date for PT Re-Evaluation  07/22/17    Authorization Type  [employer] Medicare     Authorization Time Period  07/01/17 to 08/12/16    Authorization - Visit Number  6    Authorization - Number of Visits  10    PT Start Time  1033    PT Stop Time  1120    PT Time Calculation (min)  47 min    Activity Tolerance  Patient tolerated treatment well    Behavior During Therapy  [facility] for tasks assessed/performed       Past Medical History:  Diagnosis Date  . 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 Fracture    ribs/ right wrist x3/ left ankle/cracked vertebrae-cervical/  . GERD (gastroesophageal reflux disease)   . Primary localized osteoarthritis of left knee   . Primary localized osteoarthritis of right knee     Past Surgical History:  Procedure Laterality Date  . BASAL CELL CARCINOMA EXCISION    . COLONOSCOPY  07/04/2004   RMR: Diminutive rectal polyps, removed with cold biopsy forceps, otherwise normal/  Minimal left-sided diverticula, the remainder of the colonic mucosa normal. Hyerplastic  . COLONOSCOPY N/A 03/27/2013   Dr.Rourk-prominent internal hemorrhoids o/w normal rectum. scattered sigmoid diverticula with associated areas of submucosal petechiae. two 92mm diminutive polypoid lesions opposite the ileocecal valve the remainder of the colonic mucosa appeared normal. bx= tubular adenoma, benign hyperemic colorectal mucosa with focal active colitis  . COLONOSCOPY N/A 05/26/2017   Procedure: COLONOSCOPY;  Surgeon: [name], MD;  [facility];  Service:  Endoscopy;  Laterality: N/A;  7:30am  . FOOT SURGERY Left    X 2-otif  . FOOT SURGERY Right 2003  . HEMORRHOID BANDING    . KNEE SURGERY Left 1973   open  . OLECRANON BURSECTOMY Right 03/29/2014   Procedure: EXCISION OF RIGHT OLECRANON BURSA;  Surgeon: [name], MD;  [facility];  Service: Orthopedics;  Laterality: Right;  . TOTAL KNEE ARTHROPLASTY Left 06/29/2016   Procedure: LEFT TOTAL KNEE ARTHROPLASTY;  Surgeon: [name], MD;  Location: [place];  Service: Orthopedics;  Laterality: Left;  . TOTAL KNEE ARTHROPLASTY Right 06/28/2017   Procedure: TOTAL KNEE ARTHROPLASTY;  Surgeon: [name], MD;  Location: [place];  Service: Orthopedics;  Laterality: Right;    There were no vitals filed for this visit.  Subjective Assessment - 07/19/17 1159    Subjective  Pt states he has no pain until it gets into end ROM.  Working on his motion at home and walking about 15 minutes a day.    Currently in Pain?  No/denies                      [facility] Adult PT Treatment/Exercise - 07/19/17 0001      Knee/Hip Exercises: Stretches   Active Hamstring Stretch  Right;3 reps;30 seconds    Active Hamstring Stretch Limitations  12" step    Knee: Self-Stretch to increase Flexion  Right;10 seconds    Knee:  Self-Stretch Limitations  10 reps on 12" step    Gastroc Stretch  Both;3 reps;30 seconds    Gastroc Stretch Limitations  slant board      Knee/Hip Exercises: Aerobic   Stationary Bike  4 minutes rocking      Knee/Hip Exercises: Standing   Heel Raises  15 reps    Terminal Knee Extension  Right;AROM;15 reps      Knee/Hip Exercises: Supine   Quad Sets  Right;10 reps    Short Arc Quad Sets  Right;10 reps    Knee Extension  Limitations    Knee Extension Limitations  6    Knee Flexion  Limitations    Knee Flexion Limitations  90      Knee/Hip Exercises: Prone   Hamstring Curl  15 reps    Other Prone Exercises  quad set 10 reps      Manual Therapy   Manual Therapy   Edema management;Soft tissue mobilization    Manual therapy comments  Manual complete separate rest of tx    Edema Management  Light massage in distal to proximal direction to assist with edema reduction; soft tissue massage to right hamstrings to increase relaxation    Soft tissue mobilization  to improve mobility of tissue               PT Short Term Goals - 07/01/17 1523      PT SHORT TERM GOAL #1   Title  Patient to demonstrate 0-120 degrees R knee in order to improve gait pattern and reduce pain R knee     Time  3    Period  Weeks    Status  New    Target Date  07/22/17      PT SHORT TERM GOAL #2   Title  Patient to be able to complete TUG test in 10 seconds or less with no device in order to show improved gait and balance     Time  3    Period  Weeks    Status  New      PT SHORT TERM GOAL #3   Title  Patient to be ambulatory with SPC and consistent heel toe pattern in order to show improved mobility and community access     Time  3    Period  Weeks    Status  New      PT SHORT TERM GOAL #4   Title  Patient to be compliant with appropriate HEP, to be updated as appropriate     Time  1    Period  Weeks    Status  New    Target Date  07/08/17        PT Long Term Goals - 07/01/17 1527      PT LONG TERM GOAL #1   Title  Patient to demonstrate MMT as being 5/5 in all tested groups in order to show improved knee strength and improve gait and overall mobiltiy     Time  6    Period  Weeks    Status  New    Target Date  08/12/17      PT LONG TERM GOAL #2   Title  Patient to report he has been able to successfully complete gardening based tasks and play with grandchildren with no increase in R knee pain in order to facilitate return to PLOF     Time  6    Period  Weeks    Status  New  PT LONG TERM GOAL #3   Title  Paitent to be able to ambulate at least 893ft during 3MWT, no device, and will be ambulatory in community without device in order to show  improved mobilty and facilitate return to PLOF     Time  6    Period  Weeks    Status  New      PT LONG TERM GOAL #4   Title  Patient to be able to reciprocally ascend/descend full flight of stairs with no railing, minimal unsteadiness, good eccentric control in order to improve home and community access     Time  6    Period  Weeks    Status  New            Plan - 07/19/17 1155    Clinical Impression Statement  Pt now 3 weeks post-op with continued focus on ROM.  Pt with noted tightness/adhesions and edema perimeter of knee that was addressed with manual.  ROM remains at 6-90.  Encouraged patient to continue working on this at home and increasing his ambulation.     Rehab Potential  Excellent    Clinical Impairments Affecting Rehab Potential  (+) success with PT in the past, supportive spouse, very motivated     PT Frequency  3x / week    PT Duration  6 weeks    PT Treatment/Interventions  ADLs/Self Care Home Management;Cryotherapy;DME Instruction;Gait training;Stair training;Functional mobility training;Therapeutic activities;Therapeutic exercise;Balance training;Neuromuscular re-education;Patient/family education;Manual techniques;Scar mobilization;Passive range of motion;Dry needling;Taping    PT Next Visit Plan  focus on edema control and ROM, postpone strength training until ROM is at least 0-115; gait and balance.       PT Home Exercise Plan  Eval: quad sets, heel slides, seated knee flexion stretch, zero knee, heel toe pattern     Consulted and Agree with Plan of Care  Patient       Patient will benefit from skilled therapeutic intervention in order to improve the following deficits and impairments:  Abnormal gait, Decreased skin integrity, Pain, Decreased coordination, Decreased mobility, Decreased scar mobility, Increased muscle spasms, Decreased range of motion, Decreased strength, Hypomobility, Decreased balance, Difficulty walking, Impaired flexibility, Increased  edema  Visit Diagnosis: Acute pain of right knee  Localized edema  Difficulty in walking, not elsewhere classified  Muscle weakness (generalized)     Problem List Patient Active Problem List   Diagnosis Date Noted  . Primary localized osteoarthritis of right knee   . Primary localized osteoarthritis of left knee   . Rectal bleeding 03/09/2013   Teena Irani, PTA/CLT [phone]  Teena Irani 07/19/2017, 11:59 AM  [facility] [address] [place], [name], [address] Phone: [phone]   Fax:  [phone]  Name: Spencer [name] MRN: [id number] Date of Birth: 1946-07-08

## 2017-07-21 ENCOUNTER — Ambulatory Visit (HOSPITAL_COMMUNITY): Payer: Medicare Other

## 2017-07-21 ENCOUNTER — Other Ambulatory Visit: Payer: Self-pay

## 2017-07-21 ENCOUNTER — Encounter (HOSPITAL_COMMUNITY): Payer: Self-pay

## 2017-07-21 DIAGNOSIS — M25561 Pain in right knee: Secondary | ICD-10-CM

## 2017-07-21 DIAGNOSIS — R262 Difficulty in walking, not elsewhere classified: Secondary | ICD-10-CM

## 2017-07-21 DIAGNOSIS — R600 Localized edema: Secondary | ICD-10-CM

## 2017-07-21 DIAGNOSIS — M6281 Muscle weakness (generalized): Secondary | ICD-10-CM

## 2017-07-21 NOTE — Therapy (Signed)
[place], [name], [address] Phone: [phone]   Fax:  [phone]  Physical Therapy Treatment  Patient Details  Name: Spencer [name] MRN: [id number] Date of Birth: 09-17-1945 Referring Provider: Matthew Saras    Encounter Date: 07/21/2017   Ambulation distance: 798 feet Knee flexion ROM: 90 degrees Knee extension ROM: 5 degrees  PT End of Session - 07/21/17 1128    Visit Number  7    Number of Visits  19    Date for PT Re-Evaluation  07/22/17    Authorization Type  [employer] Medicare     Authorization Time Period  07/01/17 to 08/12/16    Authorization - Visit Number  7    Authorization - Number of Visits  10    PT Start Time  0901    PT Stop Time  0946    PT Time Calculation (min)  45 min    Activity Tolerance  Patient tolerated treatment well    Behavior During Therapy  [facility] for tasks assessed/performed       Past Medical History:  Diagnosis Date  . 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 Fracture    ribs/ right wrist x3/ left ankle/cracked vertebrae-cervical/  . GERD (gastroesophageal reflux disease)   . Primary localized osteoarthritis of left knee   . Primary localized osteoarthritis of right knee     Past Surgical History:  Procedure Laterality Date  . BASAL CELL CARCINOMA EXCISION    . COLONOSCOPY  07/04/2004   RMR: Diminutive rectal polyps, removed with cold biopsy forceps, otherwise normal/  Minimal left-sided diverticula, the remainder of the colonic mucosa normal. Hyerplastic  . COLONOSCOPY N/A 03/27/2013   Dr.Rourk-prominent internal hemorrhoids o/w normal rectum. scattered sigmoid diverticula with associated areas of submucosal petechiae. two 5mm diminutive polypoid lesions opposite the ileocecal valve the remainder of the colonic mucosa appeared normal. bx= tubular adenoma, benign hyperemic colorectal mucosa with focal active colitis  . COLONOSCOPY N/A 05/26/2017    Procedure: COLONOSCOPY;  Surgeon: [name], MD;  [facility];  Service: Endoscopy;  Laterality: N/A;  7:30am  . FOOT SURGERY Left    X 2-otif  . FOOT SURGERY Right 2003  . HEMORRHOID BANDING    . KNEE SURGERY Left 1973   open  . OLECRANON BURSECTOMY Right 03/29/2014   Procedure: EXCISION OF RIGHT OLECRANON BURSA;  Surgeon: [name], MD;  [facility]: [facility];  Service: Orthopedics;  Laterality: Right;  . TOTAL KNEE ARTHROPLASTY Left 06/29/2016   Procedure: LEFT TOTAL KNEE ARTHROPLASTY;  Surgeon: [name], MD;  Location: [place];  Service: Orthopedics;  Laterality: Left;  . TOTAL KNEE ARTHROPLASTY Right 06/28/2017   Procedure: TOTAL KNEE ARTHROPLASTY;  Surgeon: [name], MD;  Location: [place];  Service: Orthopedics;  Laterality: Right;    There were no vitals filed for this visit.  Subjective Assessment - 07/21/17 0907    Subjective  Patient states that he is not having any pain this session and states that he started driving, states his physician said he could start when he felt comfortable.     Currently in Pain?  No/denies    Multiple Pain Sites  No                      [facility] Adult PT Treatment/Exercise - 07/21/17 0001      Knee/Hip Exercises: Stretches   Active Hamstring Stretch  Right;3  reps;30 seconds    Active Hamstring Stretch Limitations  12" step    Gastroc Stretch  Both;3 reps;30 seconds    Gastroc Stretch Limitations  slant board      Knee/Hip Exercises: Aerobic   Stationary Bike  5 minutes rocking      Knee/Hip Exercises: Standing   Heel Raises  15 reps    Terminal Knee Extension  Right;AROM;15 reps;Theraband Blue theraband    Functional Squat  10 reps Quarter squats x 10 with bilateral upper extremity support    Gait Training  Patient ambulated 3 laps or 798 feet this session with cueing to roll through foot and lift foot higher with swing    Other Standing Knee Exercises  Sit to stand x 10 with right lower extremity in  front of left lower extremity without upper extremities      Knee/Hip Exercises: Seated   Heel Slides  10 reps;Right;AROM;1 set Seated with towel under right foot      Knee/Hip Exercises: Supine   Quad Sets  Right;15 reps    Short Arc Quad Sets  Right;15 reps Foam roll under right thigh    Knee Extension  Limitations    Knee Extension Limitations  5    Knee Flexion  Limitations    Knee Flexion Limitations  90    Other Supine Knee/Hip Exercises  Contract relax 5 second holds x 5 to with instruction for patient to push lightly to increase knee flexion      Knee/Hip Exercises: Prone   Hamstring Curl  15 reps             PT Education - 07/21/17 1127    Education provided  Yes    Education Details  Discussed with patient him starting driving as patient stated that his physician said he could begin once he felt comfortable. Discussed that patient should exercise caution and start with going shorter distances at first to see how he tolerates it.     Person(s) Educated  Patient    Methods  Explanation    Comprehension  Verbalized understanding       PT Short Term Goals - 07/01/17 1523      PT SHORT TERM GOAL #1   Title  Patient to demonstrate 0-120 degrees R knee in order to improve gait pattern and reduce pain R knee     Time  3    Period  Weeks    Status  New    Target Date  07/22/17      PT SHORT TERM GOAL #2   Title  Patient to be able to complete TUG test in 10 seconds or less with no device in order to show improved gait and balance     Time  3    Period  Weeks    Status  New      PT SHORT TERM GOAL #3   Title  Patient to be ambulatory with SPC and consistent heel toe pattern in order to show improved mobility and community access     Time  3    Period  Weeks    Status  New      PT SHORT TERM GOAL #4   Title  Patient to be compliant with appropriate HEP, to be updated as appropriate     Time  1    Period  Weeks    Status  New    Target Date  07/08/17         PT  Long Term Goals - 07/01/17 1527      PT LONG TERM GOAL #1   Title  Patient to demonstrate MMT as being 5/5 in all tested groups in order to show improved knee strength and improve gait and overall mobiltiy     Time  6    Period  Weeks    Status  New    Target Date  08/12/17      PT LONG TERM GOAL #2   Title  Patient to report he has been able to successfully complete gardening based tasks and play with grandchildren with no increase in R knee pain in order to facilitate return to PLOF     Time  6    Period  Weeks    Status  New      PT LONG TERM GOAL #3   Title  Paitent to be able to ambulate at least 822ft during 3MWT, no device, and will be ambulatory in community without device in order to show improved mobilty and facilitate return to PLOF     Time  6    Period  Weeks    Status  New      PT LONG TERM GOAL #4   Title  Patient to be able to reciprocally ascend/descend full flight of stairs with no railing, minimal unsteadiness, good eccentric control in order to improve home and community access     Time  6    Period  Weeks    Status  New            Plan - 07/21/17 1135    Clinical Impression Statement  This session focused on continuing to progress patient's right knee range of motion and strength of muscles supporting the knee joint. Began session with 5 minutes on the bike with slow rocking to improve range of motion which was not charged. Patient performed functional squats this session with bilateral upper extremity support a quarter of the way down. Patient also performed 10 sit to stands without upper extremity support with right lower extremity in front of left lower extremity. Patient ambulated 798 feet this session with a brisk pace. Patient was cued on incorporating a heel strike and trying to lift foot higher during swing phase rather than circumduct on the right side. Patient achieved 5 degrees from extension this session and 90 degrees of knee flexion.  Plan to continue with progression of range of motion and strengthening as well as incorporate manual therapy to improve range of motion.     Rehab Potential  Excellent    Clinical Impairments Affecting Rehab Potential  (+) success with PT in the past, supportive spouse, very motivated     PT Frequency  3x / week    PT Duration  6 weeks    PT Treatment/Interventions  ADLs/[name] [facility] Home Management;Cryotherapy;DME Instruction;Gait training;Stair training;Functional mobility training;Therapeutic activities;Therapeutic exercise;Balance training;Neuromuscular re-education;Patient/family education;Manual techniques;Scar mobilization;Passive range of motion;Dry needling;Taping    PT Next Visit Plan  focus on edema control and ROM, postpone strength training until ROM is at least 0-115; continue with manual therapy next session      PT Home Exercise Plan  Eval: quad sets, heel slides, seated knee flexion stretch, zero knee, heel toe pattern     Consulted and Agree with Plan of Care  Patient       Patient will benefit from skilled therapeutic intervention in order to improve the following deficits and impairments:  Abnormal gait, Decreased skin integrity, Pain, Decreased coordination,  Decreased mobility, Decreased scar mobility, Increased muscle spasms, Decreased range of motion, Decreased strength, Hypomobility, Decreased balance, Difficulty walking, Impaired flexibility, Increased edema  Visit Diagnosis: Acute pain of right knee  Localized edema  Difficulty in walking, not elsewhere classified  Muscle weakness (generalized)     Problem List Patient Active Problem List   Diagnosis Date Noted  . Primary localized osteoarthritis of right knee   . Primary localized osteoarthritis of left knee   . Rectal bleeding 03/09/2013   Clarene Critchley PT, DPT 11:47 AM, 07/21/17 [facility] [address] [place], [name], [address] Phone: [phone]    Fax:  [phone]  Name: Spencer [name] MRN: [id number] Date of Birth: July 06, 1946

## 2017-07-23 ENCOUNTER — Encounter (HOSPITAL_COMMUNITY): Payer: Self-pay | Admitting: Physical Therapy

## 2017-07-23 ENCOUNTER — Ambulatory Visit (HOSPITAL_COMMUNITY): Payer: Medicare Other | Admitting: Physical Therapy

## 2017-07-23 DIAGNOSIS — M25561 Pain in right knee: Secondary | ICD-10-CM | POA: Diagnosis not present

## 2017-07-23 DIAGNOSIS — R600 Localized edema: Secondary | ICD-10-CM

## 2017-07-23 DIAGNOSIS — M25661 Stiffness of right knee, not elsewhere classified: Secondary | ICD-10-CM

## 2017-07-23 NOTE — Patient Instructions (Addendum)
Stretching: Hamstring (Supine)    Supporting right thigh behind knee, slowly straighten knee until stretch is felt in back of thigh. Hold _30___ seconds. Now relax down for 30 seconds breathing to relax  Repeat __3__ times per set. Do _1___ sets per session. Do __2__ sessions per day.  http://orth.exer.us/656   Copyright  VHI. All rights reserved.  Knee Wall Slide    Slowly "walk" or slide feet on wall toward floor until stretch is felt in knees. Repeat __10__ times per set. Do ___1_ sets per session. Do 2____ sessions per day.  http://orth.exer.us/674   Copyright  VHI. All rights reserved.

## 2017-07-23 NOTE — Therapy (Signed)
[name], [name], [address] Phone: [phone]   Fax:  [phone]  Physical Therapy Treatment  Patient Details  Name: Spencer [name] MRN: [id number] Date of Birth: 05/04/1946 Referring Provider: [name]    Encounter Date: 07/23/2017  PT End of Session - 07/23/17 0929    Visit Number  8    Number of Visits  19    Date for PT Re-Evaluation  08/22/17    Authorization Type  [employer] Medicare     Authorization Time Period  07/01/17 to 08/12/16;  reassess done on visit 8    Authorization - Visit Number  8    Authorization - Number of Visits  10    PT Start Time  0900    PT Stop Time  0945    PT Time Calculation (min)  45 min    Activity Tolerance  Patient tolerated treatment well    Behavior During Therapy  [facility] for tasks assessed/performed       Past Medical History:  Diagnosis Date  . 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 Fracture    ribs/ right wrist x3/ left ankle/cracked vertebrae-cervical/  . GERD (gastroesophageal reflux disease)   . Primary localized osteoarthritis of left knee   . Primary localized osteoarthritis of right knee     Past Surgical History:  Procedure Laterality Date  . BASAL CELL CARCINOMA EXCISION    . COLONOSCOPY  07/04/2004   RMR: Diminutive rectal polyps, removed with cold biopsy forceps, otherwise normal/  Minimal left-sided diverticula, the remainder of the colonic mucosa normal. Hyerplastic  . COLONOSCOPY N/A 03/27/2013   Dr.Rourk-prominent internal hemorrhoids o/w normal rectum. scattered sigmoid diverticula with associated areas of submucosal petechiae. two 35mm diminutive polypoid lesions opposite the ileocecal valve the remainder of the colonic mucosa appeared normal. bx= tubular adenoma, benign hyperemic colorectal mucosa with focal active colitis  . COLONOSCOPY N/A 05/26/2017   Procedure: COLONOSCOPY;  Surgeon: [name], MD;  [facility];  Service: Endoscopy;  Laterality: N/A;  7:30am  . FOOT SURGERY Left    X 2-otif  . FOOT SURGERY Right 2003  . HEMORRHOID BANDING    . KNEE SURGERY Left 1973   open  . OLECRANON BURSECTOMY Right 03/29/2014   Procedure: EXCISION OF RIGHT OLECRANON BURSA;  Surgeon: [name], MD;  [facility];  Service: Orthopedics;  Laterality: Right;  . TOTAL KNEE ARTHROPLASTY Left 06/29/2016   Procedure: LEFT TOTAL KNEE ARTHROPLASTY;  Surgeon: [name], MD;  Location: [place];  Service: Orthopedics;  Laterality: Left;  . TOTAL KNEE ARTHROPLASTY Right 06/28/2017   Procedure: TOTAL KNEE ARTHROPLASTY;  Surgeon: [name], MD;  Location: [facility];  Service: Orthopedics;  Laterality: Right;    There were no vitals filed for this visit.  Subjective Assessment - 07/23/17 0903    Subjective  Pt walked in [employer] for about an hour yesterday and felt it last night.    Patient is accompained by:  Family member    Limitations  Walking    How long can you sit comfortably?  Keeping his knee bent he is able to sit for 30 minutes.     How long can you stand comfortably?  an hour was 10-15 minutes.    How long can you walk comfortably?  30 to 40 minutes was only i household distances are OK     Patient Stated Goals  get  knee just as good as the left     Currently in Pain?  Yes    Pain Score  2     Pain Location  Knee    Pain Orientation  Right    Pain Descriptors / Indicators  Aching;Dull    Pain Type  Acute pain    Pain Onset  1 to 4 weeks ago    Pain Frequency  Intermittent    Aggravating Factors   activity     Pain Relieving Factors  icing     Effect of Pain on Daily Activities  increases         [facility] PT Assessment - 07/23/17 0001      Assessment   Medical Diagnosis  s/p R TKR     Referring Provider  [name]     Onset Date/Surgical Date  12/01/17    Next MD Visit  [place] in 2 weeks    Prior Therapy  PT last year for L knee       Precautions   Precautions   None      Restrictions   Weight Bearing Restrictions  No      Balance Screen   Has the patient fallen in the past 6 months  No    Has the patient had a decrease in activity level because of a fear of falling?   No    Is the patient reluctant to leave their home because of a fear of falling?   No      Prior Function   Level of Independence  Independent;Independent with basic ADLs;Independent with gait;Independent with transfers    Vocation  Retired    Leisure  fish, hunt, play golf, workout, gardening, play with grandkids       Observation/Other Assessments   Observations  localized edema typical for post-op state       AROM   Right Knee Extension  8 was 10; best has been 5 but pt walked alot and is swollen    Right Knee Flexion  95 was 80      Strength   Right Hip Flexion  5/5 was 4+/5    Right Hip Extension  5/5 was 3+/5     Right Hip ABduction  5/5 was 4/5     Left Hip Flexion  5/5 was 4+/5    Left Hip Extension  5/5 was 3/5     Left Hip ABduction  5/5    Right Knee Flexion  5/5 was 4/5    Right Knee Extension  5/5 was 4/5     Left Knee Flexion  5/5    Left Knee Extension  5/5    Right Ankle Dorsiflexion  5/5    Left Ankle Dorsiflexion  5/5      Ambulation/Gait   Gait Comments  antalgic pattern, reducd gait speed, reduced heel toe pattern, reduced foot dorsiflexion, flat foot stance R foot      6 minute walk test results    Aerobic Endurance Distance Walked  432    Endurance additional comments  3MWT no device                   [facility] Adult PT Treatment/Exercise - 07/23/17 0001      Knee/Hip Exercises: Stretches   Active Hamstring Stretch  Right;3 reps;30 seconds    Quad Stretch  3 reps;30 seconds      Knee/Hip Exercises: Supine   Quad Sets  10 reps    Heel  Slides  10 reps    Knee Extension Limitations  8    Knee Flexion Limitations  95      Knee/Hip Exercises: Prone   Hamstring Curl  10 reps    Contract/Relax to Increase Flexion  5x    Other  Prone Exercises  quad set 10 reps      Manual Therapy   Manual Therapy  Edema management;Soft tissue mobilization    Manual therapy comments  Manual complete separate rest of tx    Edema Management  Light massage in distal to proximal direction to assist with edema reduction; soft tissue massage to right hamstrings to increase relaxation    Soft tissue mobilization  to improve mobility of tissue               PT Short Term Goals - 07/23/17 0935      PT SHORT TERM GOAL #1   Title  Patient to demonstrate 0-120 degrees R knee in order to improve gait pattern and reduce pain R knee     Time  3    Period  Weeks    Status  On-going      PT SHORT TERM GOAL #2   Title  Patient to be able to complete TUG test in 10 seconds or less with no device in order to show improved gait and balance     Time  3    Period  Weeks    Status  Achieved      PT SHORT TERM GOAL #3   Title  Patient to be ambulatory with SPC and consistent heel toe pattern in order to show improved mobility and community access     Time  3    Period  Weeks    Status  Achieved      PT SHORT TERM GOAL #4   Title  Patient to be compliant with appropriate HEP, to be updated as appropriate     Time  1    Period  Weeks    Status  Achieved        PT Long Term Goals - 07/23/17 0935      PT LONG TERM GOAL #1   Title  Patient to demonstrate MMT as being 5/5 in all tested groups in order to show improved knee strength and improve gait and overall mobiltiy     Time  6    Period  Weeks    Status  Achieved      PT LONG TERM GOAL #2   Title  Patient to report he has been able to successfully complete gardening based tasks and play with grandchildren with no increase in R knee pain in order to facilitate return to PLOF     Time  6    Period  Weeks    Status  On-going      PT LONG TERM GOAL #3   Title  Paitent to be able to ambulate at least 822ft during 3MWT, no device, and will be ambulatory in community without  device in order to show improved mobilty and facilitate return to PLOF     Time  6    Period  Weeks    Status  Achieved      PT LONG TERM GOAL #4   Title  Patient to be able to reciprocally ascend/descend full flight of stairs with no railing, minimal unsteadiness, good eccentric control in order to improve home and community access     Time  6  Period  Weeks    Status  On-going            Plan - 07/23/17 0930    Clinical Impression Statement  Pt reassessed this visit.  Pt strength has returned to normal.  He still has swelling and significant decreased ROM as well as an antalgic gait.  Treatment will be changed to focus on ROM and edema only.      Clinical Presentation  Stable    Rehab Potential  Excellent    Clinical Impairments Affecting Rehab Potential  (+) success with PT in the past, supportive spouse, very motivated     PT Frequency  3x / week    PT Duration  6 weeks    PT Treatment/Interventions  ADLs/Self Care Home Management;Cryotherapy;DME Instruction;Gait training;Stair training;Functional mobility training;Therapeutic activities;Therapeutic exercise;Balance training;Neuromuscular re-education;Patient/family education;Manual techniques;Scar mobilization;Passive range of motion;Dry needling;Taping    PT Next Visit Plan  focus on edema control and ROM, ; continue with manual therapy next session      PT Home Exercise Plan  Eval: quad sets, heel slides, seated knee flexion stretch, zero knee, heel toe pattern     Consulted and Agree with Plan of Care  Patient       Patient will benefit from skilled therapeutic intervention in order to improve the following deficits and impairments:  Abnormal gait, Decreased skin integrity, Pain, Decreased coordination, Decreased mobility, Decreased scar mobility, Increased muscle spasms, Decreased range of motion, Decreased strength, Hypomobility, Decreased balance, Difficulty walking, Impaired flexibility, Increased edema  Visit  Diagnosis: Acute pain of right knee  Localized edema  Stiffness of right knee, not elsewhere classified     Problem List Patient Active Problem List   Diagnosis Date Noted  . Primary localized osteoarthritis of right knee   . Primary localized osteoarthritis of left knee   . Rectal bleeding 03/09/2013    Rayetta Humphrey, PT CLT [phone] 07/23/2017, 2:56 PM  [facility] [address] [place], [name], [address] Phone: [phone]   Fax:  [phone]  Name: DUONG HAYDEL MRN: [id number] Date of Birth: [birth year]

## 2017-07-26 ENCOUNTER — Ambulatory Visit (HOSPITAL_COMMUNITY): Payer: Medicare Other | Admitting: Physical Therapy

## 2017-07-26 DIAGNOSIS — M25561 Pain in right knee: Secondary | ICD-10-CM | POA: Diagnosis not present

## 2017-07-26 DIAGNOSIS — R600 Localized edema: Secondary | ICD-10-CM

## 2017-07-26 DIAGNOSIS — M25661 Stiffness of right knee, not elsewhere classified: Secondary | ICD-10-CM

## 2017-07-26 DIAGNOSIS — R262 Difficulty in walking, not elsewhere classified: Secondary | ICD-10-CM

## 2017-07-26 NOTE — Therapy (Signed)
[place], [name], [address] Phone: [phone]   Fax:  [phone]  Physical Therapy Treatment  Patient Details  Name: Spencer [name] MRN: [id number] Date of Birth: 11-Nov-1945 Referring Provider: [name]    Encounter Date: 07/26/2017  PT End of Session - 07/26/17 1216    Visit Number  9    Number of Visits  19    Date for PT Re-Evaluation  08/22/17    Authorization Type  [employer] Medicare     Authorization Time Period  07/01/17 to 08/12/16;  reassess done on visit 8    Authorization - Visit Number  9    Authorization - Number of Visits  10    PT Start Time  0902    PT Stop Time  0949    PT Time Calculation (min)  47 min    Activity Tolerance  Patient tolerated treatment well    Behavior During Therapy  [facility] for tasks assessed/performed       Past Medical History:  Diagnosis Date  . 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 Fracture    ribs/ right wrist x3/ left ankle/cracked vertebrae-cervical/  . GERD (gastroesophageal reflux disease)   . Primary localized osteoarthritis of left knee   . Primary localized osteoarthritis of right knee     Past Surgical History:  Procedure Laterality Date  . BASAL CELL CARCINOMA EXCISION    . COLONOSCOPY  07/04/2004   RMR: Diminutive rectal polyps, removed with cold biopsy forceps, otherwise normal/  Minimal left-sided diverticula, the remainder of the colonic mucosa normal. Hyerplastic  . COLONOSCOPY N/A 03/27/2013   Dr.Rourk-prominent internal hemorrhoids o/w normal rectum. scattered sigmoid diverticula with associated areas of submucosal petechiae. two 7mm diminutive polypoid lesions opposite the ileocecal valve the remainder of the colonic mucosa appeared normal. bx= tubular adenoma, benign hyperemic colorectal mucosa with focal active colitis  . COLONOSCOPY N/A 05/26/2017   Procedure: COLONOSCOPY;  Surgeon: [name], MD;  [facility];  Service: Endoscopy;  Laterality: N/A;  7:30am  . FOOT SURGERY Left    X 2-otif  . FOOT SURGERY Right 2003  . HEMORRHOID BANDING    . KNEE SURGERY Left 1973   open  . OLECRANON BURSECTOMY Right 03/29/2014   Procedure: EXCISION OF RIGHT OLECRANON BURSA;  Surgeon: [name], MD;  Location: [facility];  Service: Orthopedics;  Laterality: Right;  . TOTAL KNEE ARTHROPLASTY Left 06/29/2016   Procedure: LEFT TOTAL KNEE ARTHROPLASTY;  Surgeon: [name], MD;  Location: [facility];  Service: Orthopedics;  Laterality: Left;  . TOTAL KNEE ARTHROPLASTY Right 06/28/2017   Procedure: TOTAL KNEE ARTHROPLASTY;  Surgeon: [name], MD;  Location: [place];  Service: Orthopedics;  Laterality: Right;    There were no vitals filed for this visit.  Subjective Assessment - 07/26/17 0914    Subjective  Pt states he's been working on his exercises, especially stretching to work on his ROM.  Reports general stiffness put no pain.    Currently in Pain?  No/denies                      [facility] Adult PT Treatment/Exercise - 07/26/17 0001      Knee/Hip Exercises: Stretches   Active Hamstring Stretch  Right;3 reps;30 seconds    Quad Stretch  3 reps;30 seconds    Gastroc Stretch  Both;3 reps;30 seconds    Press photographer  Limitations  slant board      Knee/Hip Exercises: Aerobic   Stationary Bike  5 minutes rocking      Knee/Hip Exercises: Supine   Quad Sets  10 reps    Heel Slides  10 reps    Knee Extension Limitations  8    Knee Flexion Limitations  95    Other Supine Knee/Hip Exercises  PROM 98 degrees flexion      Knee/Hip Exercises: Prone   Hamstring Curl  15 reps    Contract/Relax to Increase Flexion  5x    Other Prone Exercises  quad set 10 reps      Manual Therapy   Manual Therapy  Edema management;Soft tissue mobilization;Myofascial release    Manual therapy comments  Manual complete separate rest of tx    Edema Management  retro to reduce edema    Soft tissue  mobilization  to improve mobility of tissue    Myofascial Release  tight restrictions in ITB and superior knee, scar tissue               PT Short Term Goals - 07/23/17 0935      PT SHORT TERM GOAL #1   Title  Patient to demonstrate 0-120 degrees R knee in order to improve gait pattern and reduce pain R knee     Time  3    Period  Weeks    Status  On-going      PT SHORT TERM GOAL #2   Title  Patient to be able to complete TUG test in 10 seconds or less with no device in order to show improved gait and balance     Time  3    Period  Weeks    Status  Achieved      PT SHORT TERM GOAL #3   Title  Patient to be ambulatory with SPC and consistent heel toe pattern in order to show improved mobility and community access     Time  3    Period  Weeks    Status  Achieved      PT SHORT TERM GOAL #4   Title  Patient to be compliant with appropriate HEP, to be updated as appropriate     Time  1    Period  Weeks    Status  Achieved        PT Long Term Goals - 07/23/17 0935      PT LONG TERM GOAL #1   Title  Patient to demonstrate MMT as being 5/5 in all tested groups in order to show improved knee strength and improve gait and overall mobiltiy     Time  6    Period  Weeks    Status  Achieved      PT LONG TERM GOAL #2   Title  Patient to report he has been able to successfully complete gardening based tasks and play with grandchildren with no increase in R knee pain in order to facilitate return to PLOF     Time  6    Period  Weeks    Status  On-going      PT LONG TERM GOAL #3   Title  Paitent to be able to ambulate at least 841ft during 3MWT, no device, and will be ambulatory in community without device in order to show improved mobilty and facilitate return to PLOF     Time  6    Period  Weeks    Status  Achieved      PT LONG TERM GOAL #4   Title  Patient to be able to reciprocally ascend/descend full flight of stairs with no railing, minimal unsteadiness, good  eccentric control in order to improve home and community access     Time  6    Period  Weeks    Status  On-going            Plan - 07/26/17 1217    Clinical Impression Statement  continued with focus on ROM.  Noted adhesions/tightness in ITB and superior knee addressed with MFR techniques.  Improved mobiltiy following able to achieve 98 degrees PROM, however AROM remained 8-95 degrees.  Encouraged to continue focusing on his ROM at home.     Rehab Potential  Excellent    Clinical Impairments Affecting Rehab Potential  (+) success with PT in the past, supportive spouse, very motivated     PT Frequency  3x / week    PT Duration  6 weeks    PT Treatment/Interventions  ADLs/Self Care Home Management;Cryotherapy;DME Instruction;Gait training;Stair training;Functional mobility training;Therapeutic activities;Therapeutic exercise;Balance training;Neuromuscular re-education;Patient/family education;Manual techniques;Scar mobilization;Passive range of motion;Dry needling;Taping    PT Next Visit Plan  focus on edema control and ROM, ; continue with manual therapy next session.  Add biodex PROM    PT Home Exercise Plan  Eval: quad sets, heel slides, seated knee flexion stretch, zero knee, heel toe pattern     Consulted and Agree with Plan of Care  Patient       Patient will benefit from skilled therapeutic intervention in order to improve the following deficits and impairments:  Abnormal gait, Decreased skin integrity, Pain, Decreased coordination, Decreased mobility, Decreased scar mobility, Increased muscle spasms, Decreased range of motion, Decreased strength, Hypomobility, Decreased balance, Difficulty walking, Impaired flexibility, Increased edema  Visit Diagnosis: Acute pain of right knee  Localized edema  Stiffness of right knee, not elsewhere classified  Difficulty in walking, not elsewhere classified     Problem List Patient Active Problem List   Diagnosis Date Noted  .  Primary localized osteoarthritis of right knee   . Primary localized osteoarthritis of left knee   . Rectal bleeding 03/09/2013   Teena Irani, PTA/CLT [phone]  Teena Irani 07/26/2017, 12:27 PM  [facility] [address] [place], [name], [address] Phone: [phone]   Fax:  [phone]  Name: Spencer [name] MRN: [id number] Date of Birth: [birth year]

## 2017-07-28 ENCOUNTER — Encounter (HOSPITAL_COMMUNITY): Payer: Self-pay

## 2017-07-28 ENCOUNTER — Ambulatory Visit (HOSPITAL_COMMUNITY): Payer: Medicare Other

## 2017-07-28 DIAGNOSIS — M6281 Muscle weakness (generalized): Secondary | ICD-10-CM

## 2017-07-28 DIAGNOSIS — R262 Difficulty in walking, not elsewhere classified: Secondary | ICD-10-CM

## 2017-07-28 DIAGNOSIS — M25561 Pain in right knee: Secondary | ICD-10-CM | POA: Diagnosis not present

## 2017-07-28 DIAGNOSIS — R600 Localized edema: Secondary | ICD-10-CM

## 2017-07-28 DIAGNOSIS — R2681 Unsteadiness on feet: Secondary | ICD-10-CM

## 2017-07-28 NOTE — Therapy (Signed)
[place], [name], [address] Phone: [phone]   Fax:  [phone]  Physical Therapy Treatment  Patient Details  Name: Spencer [name] MRN: [id number] Date of Birth: 1946-05-19 Referring Provider: [name]    Encounter Date: 07/28/2017  PT End of Session - 07/28/17 1034    Visit Number  10    Number of Visits  19    Date for PT Re-Evaluation  08/22/17    Authorization Type  [employer] Medicare     Authorization Time Period  07/01/17 to 08/12/16;  reassess done on visit 8    Authorization - Visit Number  10    Authorization - Number of Visits  18    PT Start Time  1030    PT Stop Time  1120    PT Time Calculation (min)  50 min    Activity Tolerance  Patient tolerated treatment well    Behavior During Therapy  [facility] for tasks assessed/performed       Past Medical History:  Diagnosis Date  . 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 Fracture    ribs/ right wrist x3/ left ankle/cracked vertebrae-cervical/  . GERD (gastroesophageal reflux disease)   . Primary localized osteoarthritis of left knee   . Primary localized osteoarthritis of right knee     Past Surgical History:  Procedure Laterality Date  . BASAL CELL CARCINOMA EXCISION    . COLONOSCOPY  07/04/2004   RMR: Diminutive rectal polyps, removed with cold biopsy forceps, otherwise normal/  Minimal left-sided diverticula, the remainder of the colonic mucosa normal. Hyerplastic  . COLONOSCOPY N/A 03/27/2013   Dr.Rourk-prominent internal hemorrhoids o/w normal rectum. scattered sigmoid diverticula with associated areas of submucosal petechiae. two 58mm diminutive polypoid lesions opposite the ileocecal valve the remainder of the colonic mucosa appeared normal. bx= tubular adenoma, benign hyperemic colorectal mucosa with focal active colitis  . COLONOSCOPY N/A 05/26/2017   Procedure: COLONOSCOPY;  Surgeon: [name], MD;   [facility];  Service: Endoscopy;  Laterality: N/A;  7:30am  . FOOT SURGERY Left    X 2-otif  . FOOT SURGERY Right 2003  . HEMORRHOID BANDING    . KNEE SURGERY Left 1973   open  . OLECRANON BURSECTOMY Right 03/29/2014   Procedure: EXCISION OF RIGHT OLECRANON BURSA;  Surgeon: [name], MD;  [facility]: [facility];  Service: Orthopedics;  Laterality: Right;  . TOTAL KNEE ARTHROPLASTY Left 06/29/2016   Procedure: LEFT TOTAL KNEE ARTHROPLASTY;  Surgeon: [name], MD;  Location: [place];  Service: Orthopedics;  Laterality: Left;  . TOTAL KNEE ARTHROPLASTY Right 06/28/2017   Procedure: TOTAL KNEE ARTHROPLASTY;  Surgeon: [name], MD;  Location: [place];  Service: Orthopedics;  Laterality: Right;    There were no vitals filed for this visit.  Subjective Assessment - 07/28/17 1033    Subjective  Pt stated he's feeling good today.  Has stationary bike at home, has been compliant with HEP daily.      Patient Stated Goals  get knee just as good as the left     Currently in Pain?  No/denies                      [facility] Adult PT Treatment/Exercise - 07/28/17 0001      Knee/Hip Exercises: Stretches   Quad Stretch  3 reps;30 seconds    Knee: Self-Stretch to increase Flexion  Right;10  seconds    Knee: Self-Stretch Limitations  10 reps on 12" step    Gastroc Stretch  Both;3 reps;30 seconds    Gastroc Stretch Limitations  slant board      Knee/Hip Exercises: Aerobic   Stationary Bike  5 minutes rocking seat 20      Knee/Hip Exercises: Machines for Strengthening   Other Machine  Biodex PROM 5' 92% extension and 88      Knee/Hip Exercises: Standing   Terminal Knee Extension  Right;AROM;15 reps;Theraband      Knee/Hip Exercises: Supine   Short Arc Quad Sets  15 reps    Heel Slides  10 reps    Knee Extension  AROM    Knee Extension Limitations  8    Knee Flexion  AROM    Knee Flexion Limitations  100    Other Supine Knee/Hip Exercises  PROM extension 6, flexion  102      Knee/Hip Exercises: Prone   Contract/Relax to Increase Flexion  5x    Other Prone Exercises  TKA 10x 3"      Manual Therapy   Manual Therapy  Edema management;Soft tissue mobilization;Myofascial release;Joint mobilization    Manual therapy comments  Manual complete separate rest of tx    Edema Management  retro to reduce edema    Joint Mobilization  patella all directions and tib/fib     Soft tissue mobilization  to improve mobility of tissue               PT Short Term Goals - 07/23/17 0935      PT SHORT TERM GOAL #1   Title  Patient to demonstrate 0-120 degrees R knee in order to improve gait pattern and reduce pain R knee     Time  3    Period  Weeks    Status  On-going      PT SHORT TERM GOAL #2   Title  Patient to be able to complete TUG test in 10 seconds or less with no device in order to show improved gait and balance     Time  3    Period  Weeks    Status  Achieved      PT SHORT TERM GOAL #3   Title  Patient to be ambulatory with SPC and consistent heel toe pattern in order to show improved mobility and community access     Time  3    Period  Weeks    Status  Achieved      PT SHORT TERM GOAL #4   Title  Patient to be compliant with appropriate HEP, to be updated as appropriate     Time  1    Period  Weeks    Status  Achieved        PT Long Term Goals - 07/23/17 0935      PT LONG TERM GOAL #1   Title  Patient to demonstrate MMT as being 5/5 in all tested groups in order to show improved knee strength and improve gait and overall mobiltiy     Time  6    Period  Weeks    Status  Achieved      PT LONG TERM GOAL #2   Title  Patient to report he has been able to successfully complete gardening based tasks and play with grandchildren with no increase in R knee pain in order to facilitate return to PLOF     Time  6  Period  Weeks    Status  On-going      PT LONG TERM GOAL #3   Title  Paitent to be able to ambulate at least 888ft during  3MWT, no device, and will be ambulatory in community without device in order to show improved mobilty and facilitate return to PLOF     Time  6    Period  Weeks    Status  Achieved      PT LONG TERM GOAL #4   Title  Patient to be able to reciprocally ascend/descend full flight of stairs with no railing, minimal unsteadiness, good eccentric control in order to improve home and community access     Time  6    Period  Weeks    Status  On-going            Plan - 07/28/17 1213    Clinical Impression Statement  Continued session focus with ROM.  Added Biodex PROM and continues wiht ROM based therex.  Pt continues to display edema present proximal knee and myofascial restrictions.  Manual technqiues complete to address these with additional joint mobs.  Pt able to acheive AROM 8-100 degressed with PROM 6-102 degress.  Encouraged pt to continue with ice application for edema control and ROM based HEP, verbally understood.      Rehab Potential  Excellent    Clinical Impairments Affecting Rehab Potential  (+) success with PT in the past, supportive spouse, very motivated     PT Frequency  3x / week    PT Duration  6 weeks    PT Treatment/Interventions  ADLs/Self Care Home Management;Cryotherapy;DME Instruction;Gait training;Stair training;Functional mobility training;Therapeutic activities;Therapeutic exercise;Balance training;Neuromuscular re-education;Patient/family education;Manual techniques;Scar mobilization;Passive range of motion;Dry needling;Taping    PT Next Visit Plan  focus on edema control and ROM, ; continue with manual therapy next session. Continue biodex PROM    PT Home Exercise Plan  Eval: quad sets, heel slides, seated knee flexion stretch, zero knee, heel toe pattern        Patient will benefit from skilled therapeutic intervention in order to improve the following deficits and impairments:  Abnormal gait, Decreased skin integrity, Pain, Decreased coordination, Decreased  mobility, Decreased scar mobility, Increased muscle spasms, Decreased range of motion, Decreased strength, Hypomobility, Decreased balance, Difficulty walking, Impaired flexibility, Increased edema  Visit Diagnosis: Acute pain of right knee  Localized edema  Difficulty in walking, not elsewhere classified  Muscle weakness (generalized)  Unsteadiness on feet     Problem List Patient Active Problem List   Diagnosis Date Noted  . Primary localized osteoarthritis of right knee   . Primary localized osteoarthritis of left knee   . Rectal bleeding 03/09/2013   Ihor Austin, [place]; [place]  Aldona Lento 07/28/2017, 12:17 PM  [facility] [address] [place], [name], [address] Phone: [phone]   Fax:  [phone]  Name: Spencer [name] MRN: [id number] Date of Birth: Feb 21, 1946

## 2017-07-30 ENCOUNTER — Ambulatory Visit (HOSPITAL_COMMUNITY): Payer: Medicare Other

## 2017-07-30 ENCOUNTER — Encounter (HOSPITAL_COMMUNITY): Payer: Self-pay

## 2017-07-30 DIAGNOSIS — R262 Difficulty in walking, not elsewhere classified: Secondary | ICD-10-CM

## 2017-07-30 DIAGNOSIS — R2681 Unsteadiness on feet: Secondary | ICD-10-CM

## 2017-07-30 DIAGNOSIS — M25561 Pain in right knee: Secondary | ICD-10-CM

## 2017-07-30 DIAGNOSIS — R600 Localized edema: Secondary | ICD-10-CM

## 2017-07-30 DIAGNOSIS — M6281 Muscle weakness (generalized): Secondary | ICD-10-CM

## 2017-07-30 NOTE — Therapy (Signed)
[place], [name], [address] Phone: [phone]   Fax:  [phone]  Physical Therapy Treatment  Patient Details  Name: Spencer [name] MRN: [id number] Date of Birth: 07/30/1946 Referring Provider: [name]    Encounter Date: 07/30/2017  PT End of Session - 07/30/17 0914    Visit Number  11    Number of Visits  19    Date for PT Re-Evaluation  08/22/17    Authorization Type  [employer] Medicare     Authorization Time Period  07/01/17 to 08/12/16;  reassess done on visit 8    Authorization - Visit Number  11    Authorization - Number of Visits  18    PT Start Time  [phone] 5' on bike, no charge    PT Stop Time  0948    PT Time Calculation (min)  49 min    Activity Tolerance  Patient tolerated treatment well    Behavior During Therapy  [facility] for tasks assessed/performed       Past Medical History:  Diagnosis Date  . 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 Fracture    ribs/ right wrist x3/ left ankle/cracked vertebrae-cervical/  . GERD (gastroesophageal reflux disease)   . Primary localized osteoarthritis of left knee   . Primary localized osteoarthritis of right knee     Past Surgical History:  Procedure Laterality Date  . BASAL CELL CARCINOMA EXCISION    . COLONOSCOPY  07/04/2004   RMR: Diminutive rectal polyps, removed with cold biopsy forceps, otherwise normal/  Minimal left-sided diverticula, the remainder of the colonic mucosa normal. Hyerplastic  . COLONOSCOPY N/A 03/27/2013   Dr.Rourk-prominent internal hemorrhoids o/w normal rectum. scattered sigmoid diverticula with associated areas of submucosal petechiae. two 64mm diminutive polypoid lesions opposite the ileocecal valve the remainder of the colonic mucosa appeared normal. bx= tubular adenoma, benign hyperemic colorectal mucosa with focal active colitis  . COLONOSCOPY N/A 05/26/2017   Procedure: COLONOSCOPY;  Surgeon: [name], MD;  [facility];  Service: Endoscopy;  Laterality: N/A;  7:30am  . FOOT SURGERY Left    X 2-otif  . FOOT SURGERY Right 2003  . HEMORRHOID BANDING    . KNEE SURGERY Left 1973   open  . OLECRANON BURSECTOMY Right 03/29/2014   Procedure: EXCISION OF RIGHT OLECRANON BURSA;  Surgeon: [name], MD;  Location: [facility];  Service: Orthopedics;  Laterality: Right;  . TOTAL KNEE ARTHROPLASTY Left 06/29/2016   Procedure: LEFT TOTAL KNEE ARTHROPLASTY;  Surgeon: [name], MD;  Location: [place];  Service: Orthopedics;  Laterality: Left;  . TOTAL KNEE ARTHROPLASTY Right 06/28/2017   Procedure: TOTAL KNEE ARTHROPLASTY;  Surgeon: [name], MD;  Location: [place];  Service: Orthopedics;  Laterality: Right;    There were no vitals filed for this visit.  Subjective Assessment - 07/30/17 0912    Subjective  Pt stated he had rough night last night, got an hour of sleep.  No reports of pain, knee feels stiff this morning.  Continues with HEP daily and riding bike at home.      Patient Stated Goals  get knee just as good as the left     Currently in Pain?  No/denies                      [facility] Adult PT Treatment/Exercise - 07/30/17 0001      Knee/Hip  Exercises: Stretches   Active Hamstring Stretch  Right;3 reps;30 seconds    Quad Stretch  3 reps;30 seconds      Knee/Hip Exercises: Aerobic   Stationary Bike  5 minutes rocking seat 20      Knee/Hip Exercises: Machines for Strengthening   Other Machine  Biodex PROM 5' 92% extension and 88      Knee/Hip Exercises: Supine   Short Arc Quad Sets  15 reps    Heel Slides  10 reps    Knee Extension  AROM    Knee Extension Limitations  6    Knee Flexion  AROM    Knee Flexion Limitations  100    Other Supine Knee/Hip Exercises  PROM 4-102      Manual Therapy   Manual Therapy  Edema management;Soft tissue mobilization;Passive ROM    Manual therapy comments  Manual complete separate rest of tx    Edema  Management  retro to reduce edema    Joint Mobilization  patella all directions and tib/fib     Soft tissue mobilization  to improve mobility of tissue    Myofascial Release  tight restrictions in ITB and superior knee, scar tissue    Passive ROM  PROM extension and flexion supine                PT Short Term Goals - 07/23/17 0935      PT SHORT TERM GOAL #1   Title  Patient to demonstrate 0-120 degrees R knee in order to improve gait pattern and reduce pain R knee     Time  3    Period  Weeks    Status  On-going      PT SHORT TERM GOAL #2   Title  Patient to be able to complete TUG test in 10 seconds or less with no device in order to show improved gait and balance     Time  3    Period  Weeks    Status  Achieved      PT SHORT TERM GOAL #3   Title  Patient to be ambulatory with SPC and consistent heel toe pattern in order to show improved mobility and community access     Time  3    Period  Weeks    Status  Achieved      PT SHORT TERM GOAL #4   Title  Patient to be compliant with appropriate HEP, to be updated as appropriate     Time  1    Period  Weeks    Status  Achieved        PT Long Term Goals - 07/23/17 0935      PT LONG TERM GOAL #1   Title  Patient to demonstrate MMT as being 5/5 in all tested groups in order to show improved knee strength and improve gait and overall mobiltiy     Time  6    Period  Weeks    Status  Achieved      PT LONG TERM GOAL #2   Title  Patient to report he has been able to successfully complete gardening based tasks and play with grandchildren with no increase in R knee pain in order to facilitate return to PLOF     Time  6    Period  Weeks    Status  On-going      PT LONG TERM GOAL #3   Title  Paitent to be able to ambulate at  least 865ft during 3MWT, no device, and will be ambulatory in community without device in order to show improved mobilty and facilitate return to PLOF     Time  6    Period  Weeks    Status   Achieved      PT LONG TERM GOAL #4   Title  Patient to be able to reciprocally ascend/descend full flight of stairs with no railing, minimal unsteadiness, good eccentric control in order to improve home and community access     Time  6    Period  Weeks    Status  On-going            Plan - 07/30/17 1053    Clinical Impression Statement  Pt with increased stiffness and reports of difficulty sleeping last night.  Continued session focus with ROM.  Pt continues to exhibit myofascial and soft tissue restriction as well as edema present proximal knee.  Increased time spent with manual this session to address limitations.  Pt educated on benefits with compression hose for edema control, measurements taken and paperwork given for purchase.  ROM progressiong, able to acheive AROM 5-100 degrees and PROM 4-102 degrees today.  No reports of pain through session.    Rehab Potential  Excellent    Clinical Impairments Affecting Rehab Potential  (+) success with PT in the past, supportive spouse, very motivated     PT Frequency  3x / week    PT Duration  6 weeks    PT Treatment/Interventions  ADLs/Self Care Home Management;Cryotherapy;DME Instruction;Gait training;Stair training;Functional mobility training;Therapeutic activities;Therapeutic exercise;Balance training;Neuromuscular re-education;Patient/family education;Manual techniques;Scar mobilization;Passive range of motion;Dry needling;Taping    PT Next Visit Plan  focus on edema control and ROM, ; continue with manual therapy next session. Continue biodex PROM.  F/U on compression hose    PT Home Exercise Plan  Eval: quad sets, heel slides, seated knee flexion stretch, zero knee, heel toe pattern, hamstring stretch       Patient will benefit from skilled therapeutic intervention in order to improve the following deficits and impairments:  Abnormal gait, Decreased skin integrity, Pain, Decreased coordination, Decreased mobility, Decreased scar  mobility, Increased muscle spasms, Decreased range of motion, Decreased strength, Hypomobility, Decreased balance, Difficulty walking, Impaired flexibility, Increased edema  Visit Diagnosis: Acute pain of right knee  Localized edema  Difficulty in walking, not elsewhere classified  Muscle weakness (generalized)  Unsteadiness on feet     Problem List Patient Active Problem List   Diagnosis Date Noted  . Primary localized osteoarthritis of right knee   . Primary localized osteoarthritis of left knee   . Rectal bleeding 03/09/2013   Ihor Austin, [place]; [place]  Aldona Lento 07/30/2017, 12:21 PM  [place], [name], [address] Phone: [phone]   Fax:  [phone]  Name: Spencer [name] MRN: [id number] Date of Birth: February 16, 1946

## 2017-07-30 NOTE — Patient Instructions (Signed)
Hamstring Step 3    Left leg in maximal straight leg raise, heel at maximal stretch, straighten knee further by tightening knee cap. Warning: Intense stretch. Stay within tolerance. Hold 30 seconds. Relax knee cap only. Repeat 3 times.  Copyright  VHI. All rights reserved.   

## 2017-08-02 ENCOUNTER — Ambulatory Visit (HOSPITAL_COMMUNITY): Payer: Medicare Other

## 2017-08-02 ENCOUNTER — Other Ambulatory Visit: Payer: Self-pay

## 2017-08-02 ENCOUNTER — Encounter (HOSPITAL_COMMUNITY): Payer: Self-pay

## 2017-08-02 DIAGNOSIS — R2681 Unsteadiness on feet: Secondary | ICD-10-CM

## 2017-08-02 DIAGNOSIS — M25561 Pain in right knee: Secondary | ICD-10-CM | POA: Diagnosis not present

## 2017-08-02 DIAGNOSIS — R262 Difficulty in walking, not elsewhere classified: Secondary | ICD-10-CM

## 2017-08-02 DIAGNOSIS — M6281 Muscle weakness (generalized): Secondary | ICD-10-CM

## 2017-08-02 DIAGNOSIS — R600 Localized edema: Secondary | ICD-10-CM

## 2017-08-02 NOTE — Patient Instructions (Signed)
     WALL SQUATS: 1-2 sets, 15-20 repetitions  Leaning up against a wall or closed door on your back, slide your body downward and then return back to upright position.  A door was used here because it was smoother and had less friction than the wall.   Knees should bend in line with the 2nd toe and not pass the front of the foot.

## 2017-08-02 NOTE — Therapy (Signed)
[place], [name], [address] Phone: [phone]   Fax:  [phone]  Physical Therapy Treatment  Patient Details  Name: Spencer [name] MRN: [id number] Date of Birth: 1945/09/20 Referring Provider: [name]    Encounter Date: 08/02/2017  PT End of Session - 08/02/17 0948    Visit Number  12    Number of Visits  19    Date for PT Re-Evaluation  08/22/17    Authorization Type  [employer] Medicare     Authorization Time Period  07/01/17 to 08/12/16;  reassess done on visit 8    Authorization - Visit Number  12    Authorization - Number of Visits  18    PT Start Time  0948    PT Stop Time  1033    PT Time Calculation (min)  45 min    Activity Tolerance  Patient tolerated treatment well    Behavior During Therapy  [facility] for tasks assessed/performed       Past Medical History:  Diagnosis Date  . 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 Fracture    ribs/ right wrist x3/ left ankle/cracked vertebrae-cervical/  . GERD (gastroesophageal reflux disease)   . Primary localized osteoarthritis of left knee   . Primary localized osteoarthritis of right knee     Past Surgical History:  Procedure Laterality Date  . BASAL CELL CARCINOMA EXCISION    . COLONOSCOPY  07/04/2004   RMR: Diminutive rectal polyps, removed with cold biopsy forceps, otherwise normal/  Minimal left-sided diverticula, the remainder of the colonic mucosa normal. Hyerplastic  . COLONOSCOPY N/A 03/27/2013   Dr.Rourk-prominent internal hemorrhoids o/w normal rectum. scattered sigmoid diverticula with associated areas of submucosal petechiae. two 51mm diminutive polypoid lesions opposite the ileocecal valve the remainder of the colonic mucosa appeared normal. bx= tubular adenoma, benign hyperemic colorectal mucosa with focal active colitis  . COLONOSCOPY N/A 05/26/2017   Procedure: COLONOSCOPY;  Surgeon: [name], MD;   [facility];  Service: Endoscopy;  Laterality: N/A;  7:30am  . FOOT SURGERY Left    X 2-otif  . FOOT SURGERY Right 2003  . HEMORRHOID BANDING    . KNEE SURGERY Left 1973   open  . OLECRANON BURSECTOMY Right 03/29/2014   Procedure: EXCISION OF RIGHT OLECRANON BURSA;  Surgeon: [name], MD;  Location: [facility];  Service: Orthopedics;  Laterality: Right;  . TOTAL KNEE ARTHROPLASTY Left 06/29/2016   Procedure: LEFT TOTAL KNEE ARTHROPLASTY;  Surgeon: [name], MD;  Location: [place];  Service: Orthopedics;  Laterality: Left;  . TOTAL KNEE ARTHROPLASTY Right 06/28/2017   Procedure: TOTAL KNEE ARTHROPLASTY;  Surgeon: [name], MD;  Location: [facility];  Service: Orthopedics;  Laterality: Right;    There were no vitals filed for this visit.  Subjective Assessment - 08/02/17 1036    Subjective  Patient is feeling well today and he reports he is participating in HEP 2-3 times per day and riding his bike at home. He reports he was icing and elevating his leg all weekend to try to decrease the swelling and avoid needing the compression garment.    Patient Stated Goals  get knee just as good as the left     Currently in Pain?  No/denies       [facility] Adult PT Treatment/Exercise - 08/02/17 0001      Knee/Hip Exercises: Stretches   Active Hamstring Stretch  Right;3  reps;30 seconds    Active Hamstring Stretch Limitations  12" box    Quad Stretch  3 reps;30 seconds    Gastroc Stretch  Both;3 reps;30 seconds    Gastroc Stretch Limitations  slant board      Knee/Hip Exercises: Standing   Forward Lunges  Right;Limitations;10 reps    Forward Lunges Limitations  MWM manual AP for flexion, grade III/IV    Terminal Knee Extension  Right;AROM;Theraband;Limitations;10 reps    Theraband Level (Terminal Knee Extension)  Level 4 (Blue)    Terminal Knee Extension Limitations  MWM, TB on superior tibia for PA mob    Wall Squat  20 reps;Limitations    Wall Squat Limitations  cues to weight  shift in LLE      Manual Therapy   Manual Therapy  Edema management;Joint mobilization    Manual therapy comments  Manual complete separate rest of tx    Edema Management  retro to reduce edema    Joint Mobilization  AP/PA grade III/IV        PT Education - 08/02/17 1039    Education provided  Yes    Education Details  Educated on proper form for wall squat and added to HEP. Educated on benefits of joint mobilization and MWM for exercise. Discussed RLE edema and proper retrograde massage as well as benefits of ice and elevation above his heart to decrease swelling. Educated on improtance of comrpession garment purchase due to presence of pitting edema around knee.     Person(s) Educated  Patient    Methods  Explanation;Handout    Comprehension  Verbalized understanding       PT Short Term Goals - 07/23/17 0935      PT SHORT TERM GOAL #1   Title  Patient to demonstrate 0-120 degrees R knee in order to improve gait pattern and reduce pain R knee     Time  3    Period  Weeks    Status  On-going      PT SHORT TERM GOAL #2   Title  Patient to be able to complete TUG test in 10 seconds or less with no device in order to show improved gait and balance     Time  3    Period  Weeks    Status  Achieved      PT SHORT TERM GOAL #3   Title  Patient to be ambulatory with SPC and consistent heel toe pattern in order to show improved mobility and community access     Time  3    Period  Weeks    Status  Achieved      PT SHORT TERM GOAL #4   Title  Patient to be compliant with appropriate HEP, to be updated as appropriate     Time  1    Period  Weeks    Status  Achieved        PT Long Term Goals - 07/23/17 0935      PT LONG TERM GOAL #1   Title  Patient to demonstrate MMT as being 5/5 in all tested groups in order to show improved knee strength and improve gait and overall mobiltiy     Time  6    Period  Weeks    Status  Achieved      PT LONG TERM GOAL #2   Title  Patient to  report he has been able to successfully complete gardening based tasks and play with grandchildren with  no increase in R knee pain in order to facilitate return to PLOF     Time  6    Period  Weeks    Status  On-going      PT LONG TERM GOAL #3   Title  Paitent to be able to ambulate at least 856ft during 3MWT, no device, and will be ambulatory in community without device in order to show improved mobilty and facilitate return to PLOF     Time  6    Period  Weeks    Status  Achieved      PT LONG TERM GOAL #4   Title  Patient to be able to reciprocally ascend/descend full flight of stairs with no railing, minimal unsteadiness, good eccentric control in order to improve home and community access     Time  6    Period  Weeks    Status  On-going        Plan - 08/02/17 1041    Clinical Impression Statement  Patient is progressing in therapy but continues to be limited by RLE edema and limited ROM. Session focused on manual therapy for ROM and edema management. Patient was educated on concern for pitting edema in RLE and encouraged to purchase compression garment to manage swelling, he was agreeable at end of session. He was able to advance functional strengthening for using movement with mobilization and had no increase in pain during this session. He will continue to benefit from skilled PT services to address current limitations and progress towards goals to improve QOL.    Rehab Potential  Excellent    Clinical Impairments Affecting Rehab Potential  (+) success with PT in the past, supportive spouse, very motivated     PT Frequency  3x / week    PT Duration  6 weeks    PT Treatment/Interventions  ADLs/Self Care Home Management;Cryotherapy;DME Instruction;Gait training;Stair training;Functional mobility training;Therapeutic activities;Therapeutic exercise;Balance training;Neuromuscular re-education;Patient/family education;Manual techniques;Scar mobilization;Passive range of motion;Dry  needling;Taping    PT Next Visit Plan  focus on edema control and ROM, ; continue with manual therapy next session and perfomr joint mobs for knee flexion/extension. Continue biodex PROM.  F/U on compression hose. Continue with MWM during functional strengthening.    PT Home Exercise Plan  Eval: quad sets, heel slides, seated knee flexion stretch, zero knee, heel toe pattern, hamstring stretch; 12/31 - wall squat    Consulted and Agree with Plan of Care  Patient       Patient will benefit from skilled therapeutic intervention in order to improve the following deficits and impairments:  Abnormal gait, Decreased skin integrity, Pain, Decreased coordination, Decreased mobility, Decreased scar mobility, Increased muscle spasms, Decreased range of motion, Decreased strength, Hypomobility, Decreased balance, Difficulty walking, Impaired flexibility, Increased edema  Visit Diagnosis: Acute pain of right knee  Localized edema  Difficulty in walking, not elsewhere classified  Muscle weakness (generalized)  Unsteadiness on feet     Problem List Patient Active Problem List   Diagnosis Date Noted  . Primary localized osteoarthritis of right knee   . Primary localized osteoarthritis of left knee   . Rectal bleeding 03/09/2013    Kipp Brood, PT, DPT Physical Therapist with [facility]  08/02/2017 10:48 AM    [place] [address] [place], [name], [address] Phone: [phone]   Fax:  [phone]  Name: Spencer [name] MRN: [id number] Date of Birth: Sep 05, 1945

## 2017-08-04 ENCOUNTER — Ambulatory Visit (HOSPITAL_COMMUNITY): Payer: Medicare Other | Attending: Physician Assistant

## 2017-08-04 ENCOUNTER — Encounter (HOSPITAL_COMMUNITY): Payer: Self-pay

## 2017-08-04 DIAGNOSIS — M6281 Muscle weakness (generalized): Secondary | ICD-10-CM | POA: Diagnosis present

## 2017-08-04 DIAGNOSIS — R600 Localized edema: Secondary | ICD-10-CM | POA: Diagnosis present

## 2017-08-04 DIAGNOSIS — R262 Difficulty in walking, not elsewhere classified: Secondary | ICD-10-CM | POA: Insufficient documentation

## 2017-08-04 DIAGNOSIS — R2681 Unsteadiness on feet: Secondary | ICD-10-CM | POA: Insufficient documentation

## 2017-08-04 DIAGNOSIS — M25561 Pain in right knee: Secondary | ICD-10-CM | POA: Diagnosis present

## 2017-08-04 NOTE — Therapy (Signed)
[place], [name], [address] Phone: [phone]   Fax:  [phone]  Physical Therapy Treatment  Patient Details  Name: Spencer [name] MRN: [id number] Date of Birth: [birth year] Referring Provider: [name]    Encounter Date: 08/04/2017  PT End of Session - 08/04/17 0955    Visit Number  13    Number of Visits  19    Date for PT Re-Evaluation  08/22/17    Authorization Type  [employer] Medicare     Authorization Time Period  07/01/17 to 08/12/16;  reassess done on visit 8    Authorization - Visit Number  13    Authorization - Number of Visits  18    PT Start Time  0946    PT Stop Time  1030    PT Time Calculation (min)  44 min    Activity Tolerance  Patient tolerated treatment well    Behavior During Therapy  [facility] for tasks assessed/performed       Past Medical History:  Diagnosis Date  . 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 Fracture    ribs/ right wrist x3/ left ankle/cracked vertebrae-cervical/  . GERD (gastroesophageal reflux disease)   . Primary localized osteoarthritis of left knee   . Primary localized osteoarthritis of right knee     Past Surgical History:  Procedure Laterality Date  . BASAL CELL CARCINOMA EXCISION    . COLONOSCOPY  07/04/2004   RMR: Diminutive rectal polyps, removed with cold biopsy forceps, otherwise normal/  Minimal left-sided diverticula, the remainder of the colonic mucosa normal. Hyerplastic  . COLONOSCOPY N/A 03/27/2013   Dr.Rourk-prominent internal hemorrhoids o/w normal rectum. scattered sigmoid diverticula with associated areas of submucosal petechiae. two 34mm diminutive polypoid lesions opposite the ileocecal valve the remainder of the colonic mucosa appeared normal. bx= tubular adenoma, benign hyperemic colorectal mucosa with focal active colitis  . COLONOSCOPY N/A 05/26/2017   Procedure: COLONOSCOPY;  Surgeon: [name], MD;  [facility];  Service: Endoscopy;  Laterality: N/A;  7:30am  . FOOT SURGERY Left    X 2-otif  . FOOT SURGERY Right 2003  . HEMORRHOID BANDING    . KNEE SURGERY Left 1973   open  . OLECRANON BURSECTOMY Right 03/29/2014   Procedure: EXCISION OF RIGHT OLECRANON BURSA;  Surgeon: [name], MD;  Location: [facility];  Service: Orthopedics;  Laterality: Right;  . TOTAL KNEE ARTHROPLASTY Left 06/29/2016   Procedure: LEFT TOTAL KNEE ARTHROPLASTY;  Surgeon: [name], MD;  Location: [place];  Service: Orthopedics;  Laterality: Left;  . TOTAL KNEE ARTHROPLASTY Right 06/28/2017   Procedure: TOTAL KNEE ARTHROPLASTY;  Surgeon: [name], MD;  Location: [place];  Service: Orthopedics;  Laterality: Right;    There were no vitals filed for this visit.  Subjective Assessment - 08/04/17 0950    Subjective  Pt stated he is feeling good today, no reoprts of pain today.  The place is closed was unable to purchase the compressions garments, wearing sleeve around Rt knee today.  Plans to call about compression hose today.    Patient Stated Goals  get knee just as good as the left     Currently in Pain?  No/denies                      [facility] Adult PT Treatment/Exercise - 08/04/17 0001      Knee/Hip Exercises: Stretches  Active Hamstring Stretch  Right;3 reps;30 seconds    Active Hamstring Stretch Limitations  supine    Knee: Self-Stretch to increase Flexion  Right;10 seconds    Knee: Self-Stretch Limitations  10 reps on 12" step    Gastroc Stretch  Both;3 reps;30 seconds    Gastroc Stretch Limitations  slant board      Knee/Hip Exercises: Aerobic   Stationary Bike  5 minutes rocking seat 20      Knee/Hip Exercises: Machines for Strengthening   Other Machine  Biodex PROM 5' 92% extension and 88      Knee/Hip Exercises: Standing   Wall Squat  20 reps;Limitations    Wall Squat Limitations  cues to weight shift in LLE      Knee/Hip Exercises: Supine   Knee Extension Limitations   4    Knee Flexion Limitations  100      Manual Therapy   Manual Therapy  Edema management;Joint mobilization;Soft tissue mobilization;Passive ROM    Manual therapy comments  Manual complete separate rest of tx    Edema Management  retro to reduce edema    Joint Mobilization  AP/PA grade III/IV    Soft tissue mobilization  to improve mobility of tissue    Passive ROM  PROM extension and flexion supine                PT Short Term Goals - 07/23/17 0935      PT SHORT TERM GOAL #1   Title  Patient to demonstrate 0-120 degrees R knee in order to improve gait pattern and reduce pain R knee     Time  3    Period  Weeks    Status  On-going      PT SHORT TERM GOAL #2   Title  Patient to be able to complete TUG test in 10 seconds or less with no device in order to show improved gait and balance     Time  3    Period  Weeks    Status  Achieved      PT SHORT TERM GOAL #3   Title  Patient to be ambulatory with SPC and consistent heel toe pattern in order to show improved mobility and community access     Time  3    Period  Weeks    Status  Achieved      PT SHORT TERM GOAL #4   Title  Patient to be compliant with appropriate HEP, to be updated as appropriate     Time  1    Period  Weeks    Status  Achieved        PT Long Term Goals - 07/23/17 0935      PT LONG TERM GOAL #1   Title  Patient to demonstrate MMT as being 5/5 in all tested groups in order to show improved knee strength and improve gait and overall mobiltiy     Time  6    Period  Weeks    Status  Achieved      PT LONG TERM GOAL #2   Title  Patient to report he has been able to successfully complete gardening based tasks and play with grandchildren with no increase in R knee pain in order to facilitate return to PLOF     Time  6    Period  Weeks    Status  On-going      PT LONG TERM GOAL #3   Title  Physicist, medical  to be able to ambulate at least 863ft during 3MWT, no device, and will be ambulatory in community  without device in order to show improved mobilty and facilitate return to PLOF     Time  6    Period  Weeks    Status  Achieved      PT LONG TERM GOAL #4   Title  Patient to be able to reciprocally ascend/descend full flight of stairs with no railing, minimal unsteadiness, good eccentric control in order to improve home and community access     Time  6    Period  Weeks    Status  On-going            Plan - 08/04/17 1025    Clinical Impression Statement  Continued session focus with knee mobility.  Pt continues to present with edema present proximal knee and soft tissue restricitons especially with medial hamstrings.  Manual retro massage, joint mobs and soft tissue mobilizations complete to address restrictions, increased time this session wiht STM to hamstrings as very tight.  Discussed purchase wtih compression hose, pt plans to call later today.  AROM 4-100 degrees today.      Rehab Potential  Excellent    Clinical Impairments Affecting Rehab Potential  (+) success with PT in the past, supportive spouse, very motivated     PT Frequency  3x / week    PT Duration  6 weeks    PT Treatment/Interventions  ADLs/Self Care Home Management;Cryotherapy;DME Instruction;Gait training;Stair training;Functional mobility training;Therapeutic activities;Therapeutic exercise;Balance training;Neuromuscular re-education;Patient/family education;Manual techniques;Scar mobilization;Passive range of motion;Dry needling;Taping    PT Next Visit Plan  focus on edema control and ROM, ; continue with manual therapy next session and perfomr joint mobs for knee flexion/extension. Continue biodex PROM.  F/U on compression hose. Continue with MWM during functional strengthening.    PT Home Exercise Plan  Eval: quad sets, heel slides, seated knee flexion stretch, zero knee, heel toe pattern, hamstring stretch; 12/31 - wall squat       Patient will benefit from skilled therapeutic intervention in order to improve  the following deficits and impairments:  Abnormal gait, Decreased skin integrity, Pain, Decreased coordination, Decreased mobility, Decreased scar mobility, Increased muscle spasms, Decreased range of motion, Decreased strength, Hypomobility, Decreased balance, Difficulty walking, Impaired flexibility, Increased edema  Visit Diagnosis: Acute pain of right knee  Localized edema  Difficulty in walking, not elsewhere classified  Muscle weakness (generalized)  Unsteadiness on feet     Problem List Patient Active Problem List   Diagnosis Date Noted  . Primary localized osteoarthritis of right knee   . Primary localized osteoarthritis of left knee   . Rectal bleeding 03/09/2013   Ihor Austin, [place]; [place]  Aldona Lento 08/04/2017, 12:23 PM  [place] [address] [place], [name], [address] Phone: [phone]   Fax:  [phone]  Name: LLOYDE LUDLAM MRN: [id number] Date of Birth: 11/21/1945

## 2017-08-06 ENCOUNTER — Ambulatory Visit (HOSPITAL_COMMUNITY): Payer: Medicare Other

## 2017-08-06 ENCOUNTER — Encounter (HOSPITAL_COMMUNITY): Payer: Self-pay

## 2017-08-06 DIAGNOSIS — M25561 Pain in right knee: Secondary | ICD-10-CM

## 2017-08-06 DIAGNOSIS — M6281 Muscle weakness (generalized): Secondary | ICD-10-CM

## 2017-08-06 DIAGNOSIS — R600 Localized edema: Secondary | ICD-10-CM

## 2017-08-06 DIAGNOSIS — R2681 Unsteadiness on feet: Secondary | ICD-10-CM

## 2017-08-06 DIAGNOSIS — R262 Difficulty in walking, not elsewhere classified: Secondary | ICD-10-CM

## 2017-08-06 NOTE — Therapy (Signed)
[place], [name], [address] Phone: [phone]   Fax:  [phone]  Physical Therapy Treatment  Patient Details  Name: Spencer [name] MRN: [id number] Date of Birth: Apr 23, 1946 Referring Provider: [name]    Encounter Date: 08/06/2017  PT End of Session - 08/06/17 1036    Visit Number  14    Number of Visits  19    Date for PT Re-Evaluation  08/22/17    Authorization Type  [employer] Medicare     Authorization Time Period  07/01/17 to 08/12/16;  reassess done on visit 8    Authorization - Visit Number  14    Authorization - Number of Visits  18    PT Start Time  1032 4 min on bike, no charge    PT Stop Time  1117    PT Time Calculation (min)  45 min    Activity Tolerance  Patient tolerated treatment well    Behavior During Therapy  [facility] for tasks assessed/performed       Past Medical History:  Diagnosis Date  . 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 Fracture    ribs/ right wrist x3/ left ankle/cracked vertebrae-cervical/  . GERD (gastroesophageal reflux disease)   . Primary localized osteoarthritis of left knee   . Primary localized osteoarthritis of right knee     Past Surgical History:  Procedure Laterality Date  . BASAL CELL CARCINOMA EXCISION    . COLONOSCOPY  07/04/2004   RMR: Diminutive rectal polyps, removed with cold biopsy forceps, otherwise normal/  Minimal left-sided diverticula, the remainder of the colonic mucosa normal. Hyerplastic  . COLONOSCOPY N/A 03/27/2013   Dr.Rourk-prominent internal hemorrhoids o/w normal rectum. scattered sigmoid diverticula with associated areas of submucosal petechiae. two 40mm diminutive polypoid lesions opposite the ileocecal valve the remainder of the colonic mucosa appeared normal. bx= tubular adenoma, benign hyperemic colorectal mucosa with focal active colitis  . COLONOSCOPY N/A 05/26/2017   Procedure: COLONOSCOPY;  Surgeon: [name], MD;  [facility];  Service: Endoscopy;  Laterality: N/A;  7:30am  . FOOT SURGERY Left    X 2-otif  . FOOT SURGERY Right 2003  . HEMORRHOID BANDING    . KNEE SURGERY Left 1973   open  . OLECRANON BURSECTOMY Right 03/29/2014   Procedure: EXCISION OF RIGHT OLECRANON BURSA;  Surgeon: [name], MD;  [facility];  Service: Orthopedics;  Laterality: Right;  . TOTAL KNEE ARTHROPLASTY Left 06/29/2016   Procedure: LEFT TOTAL KNEE ARTHROPLASTY;  Surgeon: [name], MD;  Location: [place];  Service: Orthopedics;  Laterality: Left;  . TOTAL KNEE ARTHROPLASTY Right 06/28/2017   Procedure: TOTAL KNEE ARTHROPLASTY;  Surgeon: [name], MD;  Location: [place];  Service: Orthopedics;  Laterality: Right;    There were no vitals filed for this visit.  Subjective Assessment - 08/06/17 1033    Subjective  Pt stated knee is feeling good, reports he slept for 5 hours last night, the most he has since surgery.  Called [facility] and placed order for compression hose.      Patient Stated Goals  get knee just as good as the left     Currently in Pain?  No/denies                      [facility] Adult PT Treatment/Exercise - 08/06/17 0001      Knee/Hip Exercises: Stretches  Active Hamstring Stretch  Right;3 reps;30 seconds    Active Hamstring Stretch Limitations  12" step 3 directions neutral, medial and lateral    Quad Stretch  3 reps;30 seconds    Knee: Self-Stretch to increase Flexion  Right;10 seconds    Knee: Self-Stretch Limitations  10 reps on 12" step    Gastroc Stretch  Both;3 reps;30 seconds    Gastroc Stretch Limitations  slant board      Knee/Hip Exercises: Aerobic   Stationary Bike  4 min full revolution seat 20      Knee/Hip Exercises: Machines for Strengthening   Other Machine  Biodex PROM 5' 98% extension and 90 flexion      Knee/Hip Exercises: Standing   Wall Squat  20 reps;Limitations    Wall Squat Limitations  cues to weight shift in LLE       Knee/Hip Exercises: Supine   Short Arc Quad Sets  15 reps    Heel Slides  10 reps    Knee Extension  AROM    Knee Extension Limitations  3    Knee Flexion  AROM    Knee Flexion Limitations  106      Knee/Hip Exercises: Prone   Contract/Relax to Increase Flexion  5x               PT Short Term Goals - 07/23/17 0935      PT SHORT TERM GOAL #1   Title  Patient to demonstrate 0-120 degrees R knee in order to improve gait pattern and reduce pain R knee     Time  3    Period  Weeks    Status  On-going      PT SHORT TERM GOAL #2   Title  Patient to be able to complete TUG test in 10 seconds or less with no device in order to show improved gait and balance     Time  3    Period  Weeks    Status  Achieved      PT SHORT TERM GOAL #3   Title  Patient to be ambulatory with SPC and consistent heel toe pattern in order to show improved mobility and community access     Time  3    Period  Weeks    Status  Achieved      PT SHORT TERM GOAL #4   Title  Patient to be compliant with appropriate HEP, to be updated as appropriate     Time  1    Period  Weeks    Status  Achieved        PT Long Term Goals - 07/23/17 0935      PT LONG TERM GOAL #1   Title  Patient to demonstrate MMT as being 5/5 in all tested groups in order to show improved knee strength and improve gait and overall mobiltiy     Time  6    Period  Weeks    Status  Achieved      PT LONG TERM GOAL #2   Title  Patient to report he has been able to successfully complete gardening based tasks and play with grandchildren with no increase in R knee pain in order to facilitate return to PLOF     Time  6    Period  Weeks    Status  On-going      PT LONG TERM GOAL #3   Title  Paitent to be able to ambulate at least 84ft  during 3MWT, no device, and will be ambulatory in community without device in order to show improved mobilty and facilitate return to PLOF     Time  6    Period  Weeks    Status  Achieved       PT LONG TERM GOAL #4   Title  Patient to be able to reciprocally ascend/descend full flight of stairs with no railing, minimal unsteadiness, good eccentric control in order to improve home and community access     Time  6    Period  Weeks    Status  On-going            Plan - 08/06/17 1108    Clinical Impression Statement  Pt progressing well towards treatment.  Continued session focus addressing knee mobility.  Pt able to make full revolution on bike initially this session.  Edema continues to be present proximal knee though whole LE is reducing.  Pt reports ordering compression hose this week, waiting for arrival.  Continued wiht manual retro massage, joint mobs and soft tissue mobilization to address restrictions with moderate tightness to VMO and medial hamstrings.  AROM improved 3-106 degrees at EOS.      Rehab Potential  Excellent    Clinical Impairments Affecting Rehab Potential  (+) success with PT in the past, supportive spouse, very motivated     PT Frequency  3x / week    PT Duration  6 weeks    PT Treatment/Interventions  ADLs/Self Care Home Management;Cryotherapy;DME Instruction;Gait training;Stair training;Functional mobility training;Therapeutic activities;Therapeutic exercise;Balance training;Neuromuscular re-education;Patient/family education;Manual techniques;Scar mobilization;Passive range of motion;Dry needling;Taping    PT Next Visit Plan  focus on edema control and ROM, ; continue with manual therapy next session and perfomr joint mobs for knee flexion/extension. Continue biodex PROM.  F/U on compression hose. Continue with MWM during functional strengthening.    PT Home Exercise Plan  Eval: quad sets, heel slides, seated knee flexion stretch, zero knee, heel toe pattern, hamstring stretch; 12/31 - wall squat       Patient will benefit from skilled therapeutic intervention in order to improve the following deficits and impairments:  Abnormal gait, Decreased skin  integrity, Pain, Decreased coordination, Decreased mobility, Decreased scar mobility, Increased muscle spasms, Decreased range of motion, Decreased strength, Hypomobility, Decreased balance, Difficulty walking, Impaired flexibility, Increased edema  Visit Diagnosis: Acute pain of right knee  Localized edema  Difficulty in walking, not elsewhere classified  Muscle weakness (generalized)  Unsteadiness on feet     Problem List Patient Active Problem List   Diagnosis Date Noted  . Primary localized osteoarthritis of right knee   . Primary localized osteoarthritis of left knee   . Rectal bleeding 03/09/2013   Ihor Austin, [place]; [place]  Aldona Lento 08/06/2017, 12:35 PM  [place] [address] [place], [name], [address] Phone: [phone]   Fax:  [phone]  Name: Spencer [name] MRN: [id number] Date of Birth: 01-Aug-1946

## 2017-08-09 ENCOUNTER — Ambulatory Visit (HOSPITAL_COMMUNITY): Payer: Medicare Other

## 2017-08-09 DIAGNOSIS — R262 Difficulty in walking, not elsewhere classified: Secondary | ICD-10-CM

## 2017-08-09 DIAGNOSIS — R600 Localized edema: Secondary | ICD-10-CM

## 2017-08-09 DIAGNOSIS — M6281 Muscle weakness (generalized): Secondary | ICD-10-CM

## 2017-08-09 DIAGNOSIS — M25561 Pain in right knee: Secondary | ICD-10-CM | POA: Diagnosis not present

## 2017-08-09 NOTE — Therapy (Addendum)
[place], [name], [address] Phone: [phone]   Fax:  [phone]  Physical Therapy Treatment  Patient Details  Name: Spencer [name] MRN: [id number] Date of Birth: October 30, 1945 Referring Provider: [name]    Encounter Date: 08/09/2017  PT End of Session - 08/09/17 1040    Visit Number  15    Number of Visits  19    Date for PT Re-Evaluation  08/22/17    Authorization Type  [employer] Medicare     Authorization Time Period  07/01/17 to 08/12/16;  reassess done on visit 8    Authorization - Visit Number  15    Authorization - Number of Visits  18    PT Start Time  1601    PT Stop Time  1112    PT Time Calculation (min)  40 min    Equipment Utilized During Treatment  Gait belt    Activity Tolerance  Patient tolerated treatment well    Behavior During Therapy  [facility] for tasks assessed/performed       Past Medical History:  Diagnosis Date  . 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 Fracture    ribs/ right wrist x3/ left ankle/cracked vertebrae-cervical/  . GERD (gastroesophageal reflux disease)   . Primary localized osteoarthritis of left knee   . Primary localized osteoarthritis of right knee     Past Surgical History:  Procedure Laterality Date  . BASAL CELL CARCINOMA EXCISION    . COLONOSCOPY  07/04/2004   RMR: Diminutive rectal polyps, removed with cold biopsy forceps, otherwise normal/  Minimal left-sided diverticula, the remainder of the colonic mucosa normal. Hyerplastic  . COLONOSCOPY N/A 03/27/2013   Dr.Rourk-prominent internal hemorrhoids o/w normal rectum. scattered sigmoid diverticula with associated areas of submucosal petechiae. two 24mm diminutive polypoid lesions opposite the ileocecal valve the remainder of the colonic mucosa appeared normal. bx= tubular adenoma, benign hyperemic colorectal mucosa with focal active colitis  . COLONOSCOPY N/A 05/26/2017   Procedure:  COLONOSCOPY;  Surgeon: [name], MD;  [facility];  Service: Endoscopy;  Laterality: N/A;  7:30am  . FOOT SURGERY Left    X 2-otif  . FOOT SURGERY Right 2003  . HEMORRHOID BANDING    . KNEE SURGERY Left 1973   open  . OLECRANON BURSECTOMY Right 03/29/2014   Procedure: EXCISION OF RIGHT OLECRANON BURSA;  Surgeon: [name], MD;  Location: [facility];  Service: Orthopedics;  Laterality: Right;  . TOTAL KNEE ARTHROPLASTY Left 06/29/2016   Procedure: LEFT TOTAL KNEE ARTHROPLASTY;  Surgeon: [name], MD;  Location: [place];  Service: Orthopedics;  Laterality: Left;  . TOTAL KNEE ARTHROPLASTY Right 06/28/2017   Procedure: TOTAL KNEE ARTHROPLASTY;  Surgeon: [name], MD;  Location: [place];  Service: Orthopedics;  Laterality: Right;    There were no vitals filed for this visit.  Subjective Assessment - 08/09/17 1034    Subjective  Pt reports he is still awaiting his hose which are delayed, but he says his swellign is improved today. HEP continues to go well.     Currently in Pain?  No/denies          [facility] Adult PT Treatment/Exercise - 08/09/17 0001      Ambulation/Gait   Ambulation/Gait  Yes    Ambulation/Gait Assistance  7: Independent    Ambulation Distance (Feet)  450 Feet    Assistive device  None    Gait  Pattern  Within Functional Limits;Step-through pattern    Ambulation Surface  Level    Gait velocity  1.7m/s      Knee/Hip Exercises: Stretches   Active Hamstring Stretch  Right;3 reps;30 seconds    Active Hamstring Stretch Limitations  12" step 3 directions neutral, medial and lateral    Quad Stretch  3 reps;30 seconds    Knee: Self-Stretch to increase Flexion  Right;3 reps;30 seconds    Gastroc Stretch  Both;3 reps;30 seconds    Gastroc Stretch Limitations  slant board      Knee/Hip Exercises: Aerobic   Stationary Bike  4 min full revolution seat 20      Knee/Hip Exercises: Standing   Wall Squat  20 reps;Limitations    Wall Squat Limitations   cues to weight shift in LLE      Knee/Hip Exercises: Supine   Short Arc Quad Sets  10 reps;3 sets;Right    Heel Slides  10 reps 2-3lb for improved proprioception    Bridges  2 sets;15 reps;Both    Other Supine Knee/Hip Exercises  hamstrings curl on physio ball:  2x10       Knee/Hip Exercises: Prone   Contract/Relax to Increase Flexion  5x self with mobilization belt       Manual Therapy   Joint Mobilization  supine distraction mobilization  5-minutes, 10lbs           PT Short Term Goals - 07/23/17 0935      PT SHORT TERM GOAL #1   Title  Patient to demonstrate 0-120 degrees R knee in order to improve gait pattern and reduce pain R knee     Time  3    Period  Weeks    Status  On-going      PT SHORT TERM GOAL #2   Title  Patient to be able to complete TUG test in 10 seconds or less with no device in order to show improved gait and balance     Time  3    Period  Weeks    Status  Achieved      PT SHORT TERM GOAL #3   Title  Patient to be ambulatory with SPC and consistent heel toe pattern in order to show improved mobility and community access     Time  3    Period  Weeks    Status  Achieved      PT SHORT TERM GOAL #4   Title  Patient to be compliant with appropriate HEP, to be updated as appropriate     Time  1    Period  Weeks    Status  Achieved        PT Long Term Goals - 07/23/17 0935      PT LONG TERM GOAL #1   Title  Patient to demonstrate MMT as being 5/5 in all tested groups in order to show improved knee strength and improve gait and overall mobiltiy     Time  6    Period  Weeks    Status  Achieved      PT LONG TERM GOAL #2   Title  Patient to report he has been able to successfully complete gardening based tasks and play with grandchildren with no increase in R knee pain in order to facilitate return to PLOF     Time  6    Period  Weeks    Status  On-going      PT LONG TERM GOAL #  3   Title  Paitent to be able to ambulate at least 857ft during  3MWT, no device, and will be ambulatory in community without device in order to show improved mobilty and facilitate return to PLOF     Time  6    Period  Weeks    Status  Achieved      PT LONG TERM GOAL #4   Title  Patient to be able to reciprocally ascend/descend full flight of stairs with no railing, minimal unsteadiness, good eccentric control in order to improve home and community access     Time  6    Period  Weeks    Status  On-going            Plan - 08/09/17 1040    Clinical Impression Statement  Continued with current program to address mobility deficits in Right knee and progress activation of muscle groups. Pt continues to be limited by tightness in joint, albeit pain is not a major limiting factor. Pt toleratign session well, but progression toward.     Rehab Potential  Excellent    Clinical Impairments Affecting Rehab Potential  (+) success with PT in the past, supportive spouse, very motivated     PT Frequency  3x / week    PT Duration  6 weeks    PT Treatment/Interventions  ADLs/Self Care Home Management;Cryotherapy;DME Instruction;Gait training;Stair training;Functional mobility training;Therapeutic activities;Therapeutic exercise;Balance training;Neuromuscular re-education;Patient/family education;Manual techniques;Scar mobilization;Passive range of motion;Dry needling;Taping    PT Next Visit Plan  focus on edema control and ROM, ; continue with manual therapy next session and perfomr joint mobs for knee flexion/extension.  Continue with MWM during functional strengthening.    PT Home Exercise Plan  Eval: quad sets, heel slides, seated knee flexion stretch, zero knee, heel toe pattern, hamstring stretch; 12/31 - wall squat    Consulted and Agree with Plan of Care  Patient       Patient will benefit from skilled therapeutic intervention in order to improve the following deficits and impairments:  Abnormal gait, Decreased skin integrity, Pain, Decreased coordination,  Decreased mobility, Decreased scar mobility, Increased muscle spasms, Decreased range of motion, Decreased strength, Hypomobility, Decreased balance, Difficulty walking, Impaired flexibility, Increased edema  Visit Diagnosis: Acute pain of right knee  Localized edema  Difficulty in walking, not elsewhere classified  Muscle weakness (generalized)     Problem List Patient Active Problem List   Diagnosis Date Noted  . Primary localized osteoarthritis of right knee   . Primary localized osteoarthritis of left knee   . Rectal bleeding 03/09/2013   11:14 AM, 08/09/17 Etta Grandchild, PT, DPT Physical Therapist at [place] [phone] (office)      Etta Grandchild 08/09/2017, [place], [name], [address] Phone: [phone]   Fax:  [phone]  Name: Spencer [name] MRN: [id number] Date of Birth: 30-Aug-1945

## 2017-08-11 ENCOUNTER — Ambulatory Visit (HOSPITAL_COMMUNITY): Payer: Medicare Other

## 2017-08-11 ENCOUNTER — Telehealth (HOSPITAL_COMMUNITY): Payer: Self-pay

## 2017-08-11 NOTE — Telephone Encounter (Signed)
08/11/17  pt cx said he had a terrible cough and doesn't know if he is getting a cold

## 2017-08-13 ENCOUNTER — Ambulatory Visit (HOSPITAL_COMMUNITY): Payer: Medicare Other

## 2017-08-13 DIAGNOSIS — R600 Localized edema: Secondary | ICD-10-CM

## 2017-08-13 DIAGNOSIS — R262 Difficulty in walking, not elsewhere classified: Secondary | ICD-10-CM

## 2017-08-13 DIAGNOSIS — M25561 Pain in right knee: Secondary | ICD-10-CM | POA: Diagnosis not present

## 2017-08-13 DIAGNOSIS — M6281 Muscle weakness (generalized): Secondary | ICD-10-CM

## 2017-08-13 NOTE — Therapy (Signed)
[name], [name], [address] Phone: [phone]   Fax:  [phone]  Physical Therapy Treatment  Patient Details  Name: Spencer [name] MRN: [id number] Date of Birth: 11/18/1945 Referring Provider: [name]    Encounter Date: 08/13/2017  PT End of Session - 08/13/17 1110    Visit Number  16    Number of Visits  27    Date for PT Re-Evaluation  08/22/17    Authorization Type  [employer] Medicare     Authorization Time Period  07/01/17 to 08/12/16; 08/13/17-09/13/17    Authorization - Visit Number  16    Authorization - Number of Visits  18    PT Start Time  1011    PT Stop Time  1109    PT Time Calculation (min)  58 min    Activity Tolerance  Patient tolerated treatment well;Patient limited by pain    Behavior During Therapy  [facility] for tasks assessed/performed       Past Medical History:  Diagnosis Date  . 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 Fracture    ribs/ right wrist x3/ left ankle/cracked vertebrae-cervical/  . GERD (gastroesophageal reflux disease)   . Primary localized osteoarthritis of left knee   . Primary localized osteoarthritis of right knee     Past Surgical History:  Procedure Laterality Date  . BASAL CELL CARCINOMA EXCISION    . COLONOSCOPY  07/04/2004   RMR: Diminutive rectal polyps, removed with cold biopsy forceps, otherwise normal/  Minimal left-sided diverticula, the remainder of the colonic mucosa normal. Hyerplastic  . COLONOSCOPY N/A 03/27/2013   Dr.Rourk-prominent internal hemorrhoids o/w normal rectum. scattered sigmoid diverticula with associated areas of submucosal petechiae. two 25mm diminutive polypoid lesions opposite the ileocecal valve the remainder of the colonic mucosa appeared normal. bx= tubular adenoma, benign hyperemic colorectal mucosa with focal active colitis  . COLONOSCOPY N/A 05/26/2017   Procedure: COLONOSCOPY;  Surgeon: [name],  MD;  [facility];  Service: Endoscopy;  Laterality: N/A;  7:30am  . FOOT SURGERY Left    X 2-otif  . FOOT SURGERY Right 2003  . HEMORRHOID BANDING    . KNEE SURGERY Left 1973   open  . OLECRANON BURSECTOMY Right 03/29/2014   Procedure: EXCISION OF RIGHT OLECRANON BURSA;  Surgeon: [name], MD;  Location: [facility];  Service: Orthopedics;  Laterality: Right;  . TOTAL KNEE ARTHROPLASTY Left 06/29/2016   Procedure: LEFT TOTAL KNEE ARTHROPLASTY;  Surgeon: [name], MD;  Location: [place];  Service: Orthopedics;  Laterality: Left;  . TOTAL KNEE ARTHROPLASTY Right 06/28/2017   Procedure: TOTAL KNEE ARTHROPLASTY;  Surgeon: [name], MD;  Location: [place];  Service: Orthopedics;  Laterality: Right;    There were no vitals filed for this visit.  Subjective Assessment - 08/13/17 1015    Subjective  Pt reports he saw Ortho on Tuesday who is pleased with progress overall, but he wants the patient recertified for more visits to address kne ejoint hypomobility. He also put a patient on a 15d course of oral prednisone with thus far godo subjective improvement in locaized effosion, sitffness, and pain.     Currently in Pain?  No/denies         [facility] PT Assessment - 08/13/17 0001      AROM   Right Knee Flexion  97  [facility] Adult PT Treatment/Exercise - 08/13/17 0001      Knee/Hip Exercises: Stretches   Other Knee/Hip Stretches  low-load, long duration supine knee fleixon stretch:  10x60sec c 5lb, 10x LAQ 5lb between      Knee/Hip Exercises: Supine   Knee Flexion Limitations  97 prior to intervention    Other Supine Knee/Hip Exercises  10x10 c 5lb       Manual Therapy   Manual Therapy  Joint mobilization;Myofascial release;Soft tissue mobilization;Manual Traction;Passive ROM    Joint Mobilization  supine tib/fem AP mobs, knee at 90 degrees: 3x30sec tib fem rotational mob in traction, 3x30sec Grade IV bilat    Soft tissue mobilization  ART to  mid/distal quads: x 5 minutes    Myofascial Release  Vastus Lateralis, Rectus femoris:   10 minutes               PT Short Term Goals - 07/23/17 0935      PT SHORT TERM GOAL #1   Title  Patient to demonstrate 0-120 degrees R knee in order to improve gait pattern and reduce pain R knee     Time  3    Period  Weeks    Status  On-going      PT SHORT TERM GOAL #2   Title  Patient to be able to complete TUG test in 10 seconds or less with no device in order to show improved gait and balance     Time  3    Period  Weeks    Status  Achieved      PT SHORT TERM GOAL #3   Title  Patient to be ambulatory with SPC and consistent heel toe pattern in order to show improved mobility and community access     Time  3    Period  Weeks    Status  Achieved      PT SHORT TERM GOAL #4   Title  Patient to be compliant with appropriate HEP, to be updated as appropriate     Time  1    Period  Weeks    Status  Achieved        PT Long Term Goals - 07/23/17 0935      PT LONG TERM GOAL #1   Title  Patient to demonstrate MMT as being 5/5 in all tested groups in order to show improved knee strength and improve gait and overall mobiltiy     Time  6    Period  Weeks    Status  Achieved      PT LONG TERM GOAL #2   Title  Patient to report he has been able to successfully complete gardening based tasks and play with grandchildren with no increase in R knee pain in order to facilitate return to PLOF     Time  6    Period  Weeks    Status  On-going      PT LONG TERM GOAL #3   Title  Paitent to be able to ambulate at least 828ft during 3MWT, no device, and will be ambulatory in community without device in order to show improved mobilty and facilitate return to PLOF     Time  6    Period  Weeks    Status  Achieved      PT LONG TERM GOAL #4   Title  Patient to be able to reciprocally ascend/descend full flight of stairs with no railing, minimal unsteadiness, good eccentric control in  order to  improve home and community access     Time  6    Period  Weeks    Status  On-going            Plan - 08/13/17 1112    Clinical Impression Statement  Session with heavy focus on ROM of Right knee. Positive response to low-load, long duration stretching x10 minutes, with 6 degrees increased flexion, but much of this is lost after extensive soft tissue/myofsacial release to vastus lateralis and distal quads, which increased pain. This turgidpresentation of the quads seems more characteristic edema rather than true myofscial triggger points and taut bands, with significant (4+) pitting noted along the lateral thigh: ROM was muchmor epainful afterward decreasing from 103 degrees to 98 degrees. It's unclear if extensive joint mobilization was really effective this session, although patientpainful resistance is only encountered with tibiofemoral external rotation mobilization withknee bent to 85 degrees. Pt making good progress overall, but encouraged to kepe consistency of program and resist urge to increase activity in light of prednisone acquired resilience.      Rehab Potential  Excellent    Clinical Impairments Affecting Rehab Potential  (+) success with PT in the past, supportive spouse, very motivated     PT Frequency  2x / week    PT Duration  4 weeks    PT Treatment/Interventions  ADLs/Self Care Home Management;Cryotherapy;DME Instruction;Gait training;Stair training;Functional mobility training;Therapeutic activities;Therapeutic exercise;Balance training;Neuromuscular re-education;Patient/family education;Manual techniques;Scar mobilization;Passive range of motion;Dry needling;Taping    PT Next Visit Plan  continue with low load, long duration stretching again 10x60sec in supine. Focus on edema control and ROM,  Continue with manual therapy next session and perform joint mobs for knee flexion/extension.  Continue with MWM during functional strengthening.    PT Home Exercise Plan  Eval: quad  sets, heel slides, seated knee flexion stretch, zero knee, heel toe pattern, hamstring stretch; 12/31 - wall squat    Consulted and Agree with Plan of Care  Patient       Patient will benefit from skilled therapeutic intervention in order to improve the following deficits and impairments:  Abnormal gait, Decreased skin integrity, Pain, Decreased coordination, Decreased mobility, Decreased scar mobility, Increased muscle spasms, Decreased range of motion, Decreased strength, Hypomobility, Decreased balance, Difficulty walking, Impaired flexibility, Increased edema  Visit Diagnosis: Acute pain of right knee - Plan: PT plan of care cert/re-cert  Localized edema - Plan: PT plan of care cert/re-cert  Difficulty in walking, not elsewhere classified - Plan: PT plan of care cert/re-cert  Muscle weakness (generalized) - Plan: PT plan of care cert/re-cert     Problem List Patient Active Problem List   Diagnosis Date Noted  . Primary localized osteoarthritis of right knee   . Primary localized osteoarthritis of left knee   . Rectal bleeding 03/09/2013   11:20 AM, 08/13/17 Etta Grandchild, PT, DPT Physical Therapist at [facility] [phone] (office)      Etta Grandchild 08/13/2017, 11:20 AM  [place], [name], [address] Phone: [phone]   Fax:  [phone]  Name: EVERRETT LACASSE MRN: [id number] Date of Birth: 1946/05/20

## 2017-08-16 ENCOUNTER — Ambulatory Visit (HOSPITAL_COMMUNITY): Payer: Medicare Other | Admitting: Physical Therapy

## 2017-08-16 ENCOUNTER — Telehealth (HOSPITAL_COMMUNITY): Payer: Self-pay

## 2017-08-16 DIAGNOSIS — R262 Difficulty in walking, not elsewhere classified: Secondary | ICD-10-CM

## 2017-08-16 DIAGNOSIS — M25561 Pain in right knee: Secondary | ICD-10-CM

## 2017-08-16 DIAGNOSIS — M6281 Muscle weakness (generalized): Secondary | ICD-10-CM

## 2017-08-16 NOTE — Therapy (Signed)
[place], [name], [address] Phone: [phone]   Fax:  [phone]  Physical Therapy Treatment  Patient Details  Name: Spencer [name] MRN: [id number] Date of Birth: 25-Oct-1945 Referring Provider: [name]    Encounter Date: 08/16/2017  PT End of Session - 08/16/17 1458    Visit Number  17    Number of Visits  27    Date for PT Re-Evaluation  08/22/17    Authorization Type  [employer] Medicare     Authorization Time Period  07/01/17 to 08/12/16; 08/13/17-09/13/17    Authorization - Visit Number  17    Authorization - Number of Visits  27    PT Start Time  9476    PT Stop Time  1426    PT Time Calculation (min)  41 min    Activity Tolerance  Patient tolerated treatment well;Patient limited by pain    Behavior During Therapy  [facility] for tasks assessed/performed       Past Medical History:  Diagnosis Date  . 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 Fracture    ribs/ right wrist x3/ left ankle/cracked vertebrae-cervical/  . GERD (gastroesophageal reflux disease)   . Primary localized osteoarthritis of left knee   . Primary localized osteoarthritis of right knee     Past Surgical History:  Procedure Laterality Date  . BASAL CELL CARCINOMA EXCISION    . COLONOSCOPY  07/04/2004   RMR: Diminutive rectal polyps, removed with cold biopsy forceps, otherwise normal/  Minimal left-sided diverticula, the remainder of the colonic mucosa normal. Hyerplastic  . COLONOSCOPY N/A 03/27/2013   Dr.Rourk-prominent internal hemorrhoids o/w normal rectum. scattered sigmoid diverticula with associated areas of submucosal petechiae. two 81mm diminutive polypoid lesions opposite the ileocecal valve the remainder of the colonic mucosa appeared normal. bx= tubular adenoma, benign hyperemic colorectal mucosa with focal active colitis  . COLONOSCOPY N/A 05/26/2017   Procedure: COLONOSCOPY;  Surgeon: [name],  MD;  [facility];  Service: Endoscopy;  Laterality: N/A;  7:30am  . FOOT SURGERY Left    X 2-otif  . FOOT SURGERY Right 2003  . HEMORRHOID BANDING    . KNEE SURGERY Left 1973   open  . OLECRANON BURSECTOMY Right 03/29/2014   Procedure: EXCISION OF RIGHT OLECRANON BURSA;  Surgeon: [name], MD;  Location: [facility];  Service: Orthopedics;  Laterality: Right;  . TOTAL KNEE ARTHROPLASTY Left 06/29/2016   Procedure: LEFT TOTAL KNEE ARTHROPLASTY;  Surgeon: [name], MD;  Location: [place];  Service: Orthopedics;  Laterality: Left;  . TOTAL KNEE ARTHROPLASTY Right 06/28/2017   Procedure: TOTAL KNEE ARTHROPLASTY;  Surgeon: [name], MD;  Location: [place];  Service: Orthopedics;  Laterality: Right;    There were no vitals filed for this visit.  Subjective Assessment - 08/16/17 1355    Subjective  Pt reports no pain but has some discomfort with end range ROM.  States MD wants [place] to continue to focus on increasing knee ROM.    Currently in Pain?  No/denies                      [facility] Adult PT Treatment/Exercise - 08/16/17 0001      Knee/Hip Exercises: Stretches   Active Hamstring Stretch  Right;3 reps;30 seconds    Quad Stretch  3 reps;30 seconds    Knee: Self-Stretch to increase Flexion  Right  Knee: Self-Stretch Limitations  10 reps on 12" step    Gastroc Stretch  Both;3 reps;30 seconds    Gastroc Stretch Limitations  slant board      Knee/Hip Exercises: Aerobic   Stationary Bike  4 min full revolution seat 20      Knee/Hip Exercises: Supine   Knee Extension  AROM;PROM    Knee Extension Limitations  4;2    Knee Flexion  AROM;PROM    Knee Flexion Limitations  105; 109      Knee/Hip Exercises: Prone   Hamstring Curl  10 reps    Contract/Relax to Increase Flexion  5x      Manual Therapy   Manual Therapy  Myofascial release;Joint mobilization    Manual therapy comments  Manual complete separate rest of tx    Joint Mobilization  distraction,  contract/relax    Myofascial Release  ITB and tight fascia surrounding knee to improve mobility.               PT Short Term Goals - 07/23/17 0935      PT SHORT TERM GOAL #1   Title  Patient to demonstrate 0-120 degrees R knee in order to improve gait pattern and reduce pain R knee     Time  3    Period  Weeks    Status  On-going      PT SHORT TERM GOAL #2   Title  Patient to be able to complete TUG test in 10 seconds or less with no device in order to show improved gait and balance     Time  3    Period  Weeks    Status  Achieved      PT SHORT TERM GOAL #3   Title  Patient to be ambulatory with SPC and consistent heel toe pattern in order to show improved mobility and community access     Time  3    Period  Weeks    Status  Achieved      PT SHORT TERM GOAL #4   Title  Patient to be compliant with appropriate HEP, to be updated as appropriate     Time  1    Period  Weeks    Status  Achieved        PT Long Term Goals - 07/23/17 0935      PT LONG TERM GOAL #1   Title  Patient to demonstrate MMT as being 5/5 in all tested groups in order to show improved knee strength and improve gait and overall mobiltiy     Time  6    Period  Weeks    Status  Achieved      PT LONG TERM GOAL #2   Title  Patient to report he has been able to successfully complete gardening based tasks and play with grandchildren with no increase in R knee pain in order to facilitate return to PLOF     Time  6    Period  Weeks    Status  On-going      PT LONG TERM GOAL #3   Title  Paitent to be able to ambulate at least 897ft during 3MWT, no device, and will be ambulatory in community without device in order to show improved mobilty and facilitate return to PLOF     Time  6    Period  Weeks    Status  Achieved      PT LONG TERM GOAL #4   Title  Patient to be able to reciprocally ascend/descend full flight of stairs with no railing, minimal unsteadiness, good eccentric control in order to  improve home and community access     Time  6    Period  Weeks    Status  On-going            Plan - 08/16/17 1459    Clinical Impression Statement  Continued with focus on AROM/PROM and manual techniques to improve this.  Overall improving with noted improvement in gait mechanics.  Able to achieve 4-105 AROM and 2-109 PROM today in supine.  Tightness in ITB and perimeter of knee addressed with myofascial techniques to loosen tissue.  Pt without c/o pain or discomfort during or following session.  PT is wearing his thigh high compression garments today and continues to work on his ROM at home.  Between prednisone and compression, edema has improved significantly at this point.     Rehab Potential  Excellent    Clinical Impairments Affecting Rehab Potential  (+) success with PT in the past, supportive spouse, very motivated     PT Frequency  2x / week    PT Duration  4 weeks    PT Treatment/Interventions  ADLs/Self Care Home Management;Cryotherapy;DME Instruction;Gait training;Stair training;Functional mobility training;Therapeutic activities;Therapeutic exercise;Balance training;Neuromuscular re-education;Patient/family education;Manual techniques;Scar mobilization;Passive range of motion;Dry needling;Taping    PT Next Visit Plan  Continue with ROM focus with manual techniques as needed.     PT Home Exercise Plan  Eval: quad sets, heel slides, seated knee flexion stretch, zero knee, heel toe pattern, hamstring stretch; 12/31 - wall squat    Consulted and Agree with Plan of Care  Patient       Patient will benefit from skilled therapeutic intervention in order to improve the following deficits and impairments:  Abnormal gait, Decreased skin integrity, Pain, Decreased coordination, Decreased mobility, Decreased scar mobility, Increased muscle spasms, Decreased range of motion, Decreased strength, Hypomobility, Decreased balance, Difficulty walking, Impaired flexibility, Increased  edema  Visit Diagnosis: Acute pain of right knee  Difficulty in walking, not elsewhere classified  Muscle weakness (generalized)     Problem List Patient Active Problem List   Diagnosis Date Noted  . Primary localized osteoarthritis of right knee   . Primary localized osteoarthritis of left knee   . Rectal bleeding 03/09/2013   Teena Irani, PTA/CLT [phone]  Teena Irani 08/16/2017, 3:04 PM  [place] [address] [place], [name], [address] Phone: [phone]   Fax:  [phone]  Name: Spencer [name] MRN: [id number] Date of Birth: 01/16/1946

## 2017-08-16 NOTE — Telephone Encounter (Signed)
Patient had a appt conflict and needed to reschedule

## 2017-08-17 ENCOUNTER — Ambulatory Visit (HOSPITAL_COMMUNITY): Payer: Medicare Other

## 2017-08-19 ENCOUNTER — Ambulatory Visit (HOSPITAL_COMMUNITY): Payer: Medicare Other

## 2017-08-19 ENCOUNTER — Encounter (HOSPITAL_COMMUNITY): Payer: Self-pay

## 2017-08-19 DIAGNOSIS — R262 Difficulty in walking, not elsewhere classified: Secondary | ICD-10-CM

## 2017-08-19 DIAGNOSIS — R600 Localized edema: Secondary | ICD-10-CM

## 2017-08-19 DIAGNOSIS — M25561 Pain in right knee: Secondary | ICD-10-CM

## 2017-08-19 DIAGNOSIS — M6281 Muscle weakness (generalized): Secondary | ICD-10-CM

## 2017-08-19 NOTE — Therapy (Signed)
[place], [name], [address] Phone: [phone]   Fax:  [phone]  Physical Therapy Treatment  Patient Details  Name: Spencer [name] MRN: [id number] Date of Birth: July 01, 1946 Referring Provider: [name]    Encounter Date: 08/19/2017  PT End of Session - 08/19/17 0821    Visit Number  18    Number of Visits  27    Date for PT Re-Evaluation  08/22/17    Authorization Type  [employer] Medicare     Authorization Time Period  07/01/17 to 08/12/16; 08/13/17-09/13/17    Authorization - Visit Number  18    Authorization - Number of Visits  27    PT Start Time  0815 4' on bike, no charge    PT Stop Time  0902    PT Time Calculation (min)  47 min    Activity Tolerance  Patient tolerated treatment well;Patient limited by pain    Behavior During Therapy  [facility] for tasks assessed/performed       Past Medical History:  Diagnosis Date  . 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 Fracture    ribs/ right wrist x3/ left ankle/cracked vertebrae-cervical/  . GERD (gastroesophageal reflux disease)   . Primary localized osteoarthritis of left knee   . Primary localized osteoarthritis of right knee     Past Surgical History:  Procedure Laterality Date  . BASAL CELL CARCINOMA EXCISION    . COLONOSCOPY  07/04/2004   RMR: Diminutive rectal polyps, removed with cold biopsy forceps, otherwise normal/  Minimal left-sided diverticula, the remainder of the colonic mucosa normal. Hyerplastic  . COLONOSCOPY N/A 03/27/2013   Dr.Rourk-prominent internal hemorrhoids o/w normal rectum. scattered sigmoid diverticula with associated areas of submucosal petechiae. two 49mm diminutive polypoid lesions opposite the ileocecal valve the remainder of the colonic mucosa appeared normal. bx= tubular adenoma, benign hyperemic colorectal mucosa with focal active colitis  . COLONOSCOPY N/A 05/26/2017   Procedure: COLONOSCOPY;   Surgeon: [name], MD;  [facility];  Service: Endoscopy;  Laterality: N/A;  7:30am  . FOOT SURGERY Left    X 2-otif  . FOOT SURGERY Right 2003  . HEMORRHOID BANDING    . KNEE SURGERY Left 1973   open  . OLECRANON BURSECTOMY Right 03/29/2014   Procedure: EXCISION OF RIGHT OLECRANON BURSA;  Surgeon: [name], MD;  [facility];  Service: Orthopedics;  Laterality: Right;  . TOTAL KNEE ARTHROPLASTY Left 06/29/2016   Procedure: LEFT TOTAL KNEE ARTHROPLASTY;  Surgeon: [name], MD;  Location: [place];  Service: Orthopedics;  Laterality: Left;  . TOTAL KNEE ARTHROPLASTY Right 06/28/2017   Procedure: TOTAL KNEE ARTHROPLASTY;  Surgeon: [name], MD;  Location: [place];  Service: Orthopedics;  Laterality: Right;    There were no vitals filed for this visit.  Subjective Assessment - 08/19/17 0820    Subjective  Pt stated he slept good last night, no reports of pain today.      Patient Stated Goals  get knee just as good as the left     Currently in Pain?  No/denies                      [facility] Adult PT Treatment/Exercise - 08/19/17 0001      Knee/Hip Exercises: Stretches   Active Hamstring Stretch  Right;3 reps;30 seconds    Quad Stretch  3 reps;30 seconds  Knee: Self-Stretch to increase Flexion  Right    Knee: Self-Stretch Limitations  10 reps x 10" holdson 12" step    ITB Stretch  2 reps;30 seconds    Gastroc Stretch  Both;3 reps;30 seconds    Gastroc Stretch Limitations  slant board      Knee/Hip Exercises: Aerobic   Stationary Bike  4 min full revolution seat 20      Knee/Hip Exercises: Machines for Strengthening   Other Machine  [employer]x PROM 5' 100% extension and 95% flexion      Knee/Hip Exercises: Standing   Functional Squat  10 reps squats for ROM      Knee/Hip Exercises: Supine   Knee Extension  AROM;PROM    Knee Extension Limitations  3; 1    Knee Flexion  AROM;PROM    Knee Flexion Limitations  109; 110      Knee/Hip  Exercises: Prone   Hamstring Curl  15 reps    Contract/Relax to Increase Flexion  5x    Other Prone Exercises  TKA 10x 3"      Manual Therapy   Manual Therapy  Myofascial release;Joint mobilization    Manual therapy comments  Manual complete separate rest of tx    Joint Mobilization  distraction, contract/relax; patella mobs all directinos and tib/fib    Myofascial Release  ITB and tight fascia surrounding knee to improve mobility.               PT Short Term Goals - 07/23/17 0935      PT SHORT TERM GOAL #1   Title  Patient to demonstrate 0-120 degrees R knee in order to improve gait pattern and reduce pain R knee     Time  3    Period  Weeks    Status  On-going      PT SHORT TERM GOAL #2   Title  Patient to be able to complete TUG test in 10 seconds or less with no device in order to show improved gait and balance     Time  3    Period  Weeks    Status  Achieved      PT SHORT TERM GOAL #3   Title  Patient to be ambulatory with SPC and consistent heel toe pattern in order to show improved mobility and community access     Time  3    Period  Weeks    Status  Achieved      PT SHORT TERM GOAL #4   Title  Patient to be compliant with appropriate HEP, to be updated as appropriate     Time  1    Period  Weeks    Status  Achieved        PT Long Term Goals - 07/23/17 0935      PT LONG TERM GOAL #1   Title  Patient to demonstrate MMT as being 5/5 in all tested groups in order to show improved knee strength and improve gait and overall mobiltiy     Time  6    Period  Weeks    Status  Achieved      PT LONG TERM GOAL #2   Title  Patient to report he has been able to successfully complete gardening based tasks and play with grandchildren with no increase in R knee pain in order to facilitate return to PLOF     Time  6    Period  Weeks    Status  On-going      PT LONG TERM GOAL #3   Title  Paitent to be able to ambulate at least 874ft during 3MWT, no device, and  will be ambulatory in community without device in order to show improved mobilty and facilitate return to PLOF     Time  6    Period  Weeks    Status  Achieved      PT LONG TERM GOAL #4   Title  Patient to be able to reciprocally ascend/descend full flight of stairs with no railing, minimal unsteadiness, good eccentric control in order to improve home and community access     Time  6    Period  Weeks    Status  On-going            Plan - 08/19/17 2993    Clinical Impression Statement  Continued session focus with knee mobility.  Continued with manual technqiues to address joint and soft tissue restrictions, noted tightness in ITB and edema perimeter of  knee.  Added ITB stretch to address restrictions on hip with improved gait mechanics.  Pt able to achieve AROM 3-109 with PROM 2-110 degrees.  No reports of pain through session.      Rehab Potential  Excellent    Clinical Impairments Affecting Rehab Potential  (+) success with PT in the past, supportive spouse, very motivated     PT Frequency  2x / week    PT Duration  4 weeks    PT Treatment/Interventions  ADLs/Self Care Home Management;Cryotherapy;DME Instruction;Gait training;Stair training;Functional mobility training;Therapeutic activities;Therapeutic exercise;Balance training;Neuromuscular re-education;Patient/family education;Manual techniques;Scar mobilization;Passive range of motion;Dry needling;Taping    PT Next Visit Plan  Continue with ROM focus with manual techniques as needed.     PT Home Exercise Plan  Eval: quad sets, heel slides, seated knee flexion stretch, zero knee, heel toe pattern, hamstring stretch; 12/31 - wall squat       Patient will benefit from skilled therapeutic intervention in order to improve the following deficits and impairments:  Abnormal gait, Decreased skin integrity, Pain, Decreased coordination, Decreased mobility, Decreased scar mobility, Increased muscle spasms, Decreased range of motion,  Decreased strength, Hypomobility, Decreased balance, Difficulty walking, Impaired flexibility, Increased edema  Visit Diagnosis: Acute pain of right knee  Difficulty in walking, not elsewhere classified  Muscle weakness (generalized)  Localized edema     Problem List Patient Active Problem List   Diagnosis Date Noted  . Primary localized osteoarthritis of right knee   . Primary localized osteoarthritis of left knee   . Rectal bleeding 03/09/2013   Ihor Austin, LPTA; [place]  Aldona Lento 08/19/2017, 9:33 AM  [place], [name], [address] Phone: [phone]   Fax:  [phone]  Name: Spencer [name] MRN: [id number] Date of Birth: 14-Nov-1945

## 2017-08-25 ENCOUNTER — Encounter (HOSPITAL_COMMUNITY): Payer: Self-pay

## 2017-08-25 ENCOUNTER — Ambulatory Visit (HOSPITAL_COMMUNITY): Payer: Medicare Other

## 2017-08-25 DIAGNOSIS — R600 Localized edema: Secondary | ICD-10-CM

## 2017-08-25 DIAGNOSIS — M6281 Muscle weakness (generalized): Secondary | ICD-10-CM

## 2017-08-25 DIAGNOSIS — M25561 Pain in right knee: Secondary | ICD-10-CM

## 2017-08-25 DIAGNOSIS — R262 Difficulty in walking, not elsewhere classified: Secondary | ICD-10-CM

## 2017-08-25 NOTE — Patient Instructions (Addendum)
Knee Extension Mobilization: Hang (Prone)    With table supporting thighs, place ____ pound weight on right ankle. Hold 5 minutes, increase time as tolerated. Repeat 1-2 times per day  http://orth.exer.us/722   Copyright  VHI. All rights reserved.   Knee Extension Mobilization: Towel Prop    With rolled towel under right ankle, place ____ pound weight across knee. Hold 5-10 minutes. Repeat 1-2 times per set.   http://orth.exer.us/720   Copyright  VHI. All rights reserved.   KNEE: Quadriceps - Prone    Place strap around ankle. Bring ankle toward buttocks. Press hip into surface. Hold 30 seconds. 3 reps per set, 2 sets per day.  Copyright  VHI. All rights reserved.   Knee Co-Contraction: Standing (Single Leg)    Shoulder width stance, loop band just above knee and anchor behind. Loop another just below knee and anchor in front. Perform a partial squat, then straighten knee. Repeat 15 times per set.  Anchor Height: Knee  http://tub.exer.us/37   Copyright  VHI. All rights reserved.

## 2017-08-25 NOTE — Therapy (Signed)
[place], [name], [address] Phone: [phone]   Fax:  [phone]  Physical Therapy Treatment  Patient Details  Name: Spencer [name] MRN: [id number] Date of Birth: [birth year] Referring Provider: [name]   Encounter Date: 08/25/2017  PT End of Session - 08/25/17 0953    Visit Number  19    Number of Visits  19   Date for PT Re-Evaluation  08/22/17    Authorization Type  [employer] Medicare     Authorization Time Period  07/01/17 to 08/12/16; 08/13/17-09/13/17    Authorization - Visit Number  19    Authorization - Number of Visits  27    PT Start Time  0902 5' on bike at beginning, FOTO Complete; no charge    PT Stop Time  0950    PT Time Calculation (min)  48 min    Activity Tolerance  Patient tolerated treatment well;No increased pain    Behavior During Therapy  WFL for tasks assessed/performed       Past Medical History:  Diagnosis Date  . 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 Fracture    ribs/ right wrist x3/ left ankle/cracked vertebrae-cervical/  . GERD (gastroesophageal reflux disease)   . Primary localized osteoarthritis of left knee   . Primary localized osteoarthritis of right knee     Past Surgical History:  Procedure Laterality Date  . BASAL CELL CARCINOMA EXCISION    . COLONOSCOPY  07/04/2004   RMR: Diminutive rectal polyps, removed with cold biopsy forceps, otherwise normal/  Minimal left-sided diverticula, the remainder of the colonic mucosa normal. Hyerplastic  . COLONOSCOPY N/A 03/27/2013   Dr.Rourk-prominent internal hemorrhoids o/w normal rectum. scattered sigmoid diverticula with associated areas of submucosal petechiae. two 91m diminutive polypoid lesions opposite the ileocecal valve the remainder of the colonic mucosa appeared normal. bx= tubular adenoma, benign hyperemic colorectal mucosa with focal active colitis  . COLONOSCOPY N/A 05/26/2017    Procedure: COLONOSCOPY;  Surgeon: RDaneil Dolin MD;  [facility];  Service: Endoscopy;  Laterality: N/A;  7:30am  . FOOT SURGERY Left    X 2-otif  . FOOT SURGERY Right 2003  . HEMORRHOID BANDING    . KNEE SURGERY Left 1973   open  . OLECRANON BURSECTOMY Right 03/29/2014   Procedure: EXCISION OF RIGHT OLECRANON BURSA;  Surgeon: WSanjuana Kava MD;  Location: [facility];  Service: Orthopedics;  Laterality: Right;  . TOTAL KNEE ARTHROPLASTY Left 06/29/2016   Procedure: LEFT TOTAL KNEE ARTHROPLASTY;  Surgeon: RElsie Saas MD;  [place] [facility]  Service: Orthopedics;  Laterality: Left;  . TOTAL KNEE ARTHROPLASTY Right 06/28/2017   Procedure: TOTAL KNEE ARTHROPLASTY;  Surgeon: WElsie Saas MD;  [place] [facility]  Service: Orthopedics;  Laterality: Right;    There were no vitals filed for this visit.  Subjective Assessment - 08/25/17 0907    Subjective  Pt stated knee feels good today, no reports of pain today    How long can you sit comfortably?  Able to sit comfortably an hour mayble longer (was Keeping his knee bent he is able to sit for 30 minutes. )    How long can you stand comfortably?  an hour was 10-15 minutes.    How long can you walk comfortably?  30 to 40 minutes was only i household distances are OK     Patient Stated Goals  get knee just as good as the  left     Currently in Pain?  No/denies         [facility] PT Assessment - 08/25/17 0001      Assessment   Medical Diagnosis  s/p R TKR     Referring Provider  [name]    Onset Date/Surgical Date  12/01/17    Hand Dominance  Right    Next MD Visit  [place] in 2nd tuesday in February    Prior Therapy  PT last year for L knee       Observation/Other Assessments   Observations  localized edema, decreased size wiht compression hose    Focus on Therapeutic Outcomes (FOTO)   69.84 was 60.8      AROM   Right Knee Extension  3 08/25/17: AROM 3, PROM 1; was 10 07/01/17    Right Knee Flexion  111 08/25/17  AROM 111; PROM 115 was 80 07/01/17      6 minute walk test results    Aerobic Endurance Distance Walked  888 was 432 on 3MWT 11/29    Endurance additional comments  3MWT no device                   [facility] Adult PT Treatment/Exercise - 08/25/17 0001      Ambulation/Gait   Ambulation/Gait  Yes    Ambulation/Gait Assistance  7: Independent    Ambulation Distance (Feet)  888 Feet    Assistive device  None    Gait Comments  3MWT complete with good mechanics and no AD with increased speed       Knee/Hip Exercises: Stretches   Active Hamstring Stretch  Right;3 reps;30 seconds    Active Hamstring Stretch Limitations  12" step 3 directions neutral, medial and lateral    Quad Stretch  3 reps;30 seconds    Knee: Self-Stretch to increase Flexion  Right    Knee: Self-Stretch Limitations  10 reps x 10" holdson 12" step    Gastroc Stretch  Both;3 reps;30 seconds    Gastroc Stretch Limitations  slant board      Knee/Hip Exercises: Aerobic   Stationary Bike  4 min full revolution seat 20, FOTO complete during      Knee/Hip Exercises: Standing   Functional Squat  15 reps for range      Knee/Hip Exercises: Prone   Contract/Relax to Increase Flexion  5x               PT Short Term Goals - 08/25/17 1610      PT SHORT TERM GOAL #1   Title  Patient to demonstrate 0-120 degrees R knee in order to improve gait pattern and reduce pain R knee     Baseline  1/23: AROM 3-111 degrees    Status  On-going      PT SHORT TERM GOAL #2   Title  Patient to be able to complete TUG test in 10 seconds or less with no device in order to show improved gait and balance     Baseline  1/23: 6.59 seconds    Status  Achieved      PT SHORT TERM GOAL #3   Title  Patient to be ambulatory with SPC and consistent heel toe pattern in order to show improved mobility and community access     Status  Achieved      PT SHORT TERM GOAL #4   Title  Patient to be compliant with appropriate HEP, to be  updated as appropriate  Status  Achieved        PT Long Term Goals - 08/25/17 7341      PT LONG TERM GOAL #1   Title  Patient to demonstrate MMT as being 5/5 in all tested groups in order to show improved knee strength and improve gait and overall mobiltiy     Baseline  1/23: 5/5    Status  Achieved      PT LONG TERM GOAL #2   Title  Patient to report he has been able to successfully complete gardening based tasks and play with grandchildren with no increase in R knee pain in order to facilitate return to PLOF     Baseline  1/23:  Reports ability to play with grandchildren wiht no pain,  unable to assess gardening due to winter season    Status  Achieved      PT [place] #3   Title  Paitent to be able to ambulate at least 847f during 3MWT, no device, and will be ambulatory in community without device in order to show improved mobilty and facilitate return to PLOF     Baseline  1/23: 3MWT:  888 ft no AD    Status  Achieved      PT LONG TERM GOAL #4   Title  Patient to be able to reciprocally ascend/descend full flight of stairs with no railing, minimal unsteadiness, good eccentric control in order to improve home and community access     Baseline  1/23: Able to reciprocally ascend and descend 7in stairs no railing and good control    Status  Achieved            Plan - 08/25/17 0958    Clinical Impression Statement  Reviewed goals with vast improvements noted.  Pt has met 3/4 STG and all 4/4 LTGs.  Pt presents with compliance with HEP daily, improved gait mechanics and increased distance/speed with 3MWT/TUG, ability to ascend and descend stairs with minimal unsteadiness and no HR needed, strength 5/5 for all musculature.  Only goal not met was ROM, AROM 3-111 degrees.  Pt given advanced HEP to address this deficit at home, verbalized and agreed with plan.      Rehab Potential  Excellent    Clinical Impairments Affecting Rehab Potential  (+) success with PT in the past,  supportive spouse, very motivated     PT Frequency  2x / week    PT Duration  4 weeks    PT Treatment/Interventions  ADLs/Self Care Home Management;Cryotherapy;DME Instruction;Gait training;Stair training;Functional mobility training;Therapeutic activities;Therapeutic exercise;Balance training;Neuromuscular re-education;Patient/family education;Manual techniques;Scar mobilization;Passive range of motion;Dry needling;Taping    PT Next Visit Plan  D/C to HEP.      PT Home Exercise Plan  Eval: quad sets, heel slides, seated knee flexion stretch, zero knee, heel toe pattern, hamstring stretch; 12/31 - wall squat; 1/23: prone knee hang, quad stretch, knee prop., TKA.       Patient will benefit from skilled therapeutic intervention in order to improve the following deficits and impairments:  Abnormal gait, Decreased skin integrity, Pain, Decreased coordination, Decreased mobility, Decreased scar mobility, Increased muscle spasms, Decreased range of motion, Decreased strength, Hypomobility, Decreased balance, Difficulty walking, Impaired flexibility, Increased edema  Visit Diagnosis: Acute pain of right knee  Difficulty in walking, not elsewhere classified  Muscle weakness (generalized)  Localized edema     Problem List Patient Active Problem List   Diagnosis Date Noted  . Primary localized osteoarthritis of right knee   .  Primary localized osteoarthritis of left knee   . Rectal bleeding 03/09/2013   Ihor Austin, LPTA; [place]  Rayetta Humphrey, PT CLT [phone] 08/25/2017, 10:10 AM  [place], [name], [address] Phone: [phone]   Fax:  [phone]  Name: Spencer [name] MRN: [id number] Date of Birth: 1946-02-13  PHYSICAL THERAPY DISCHARGE SUMMARY  Visits from Start of Care: 19 Current functional level related to goals / functional outcomes: See above   Remaining deficits: See above     Education / Equipment: HEP  Plan: Patient agrees to discharge.  Patient goals were met. Patient is being discharged due to meeting the stated rehab goals.  ?????        Rayetta Humphrey, [place] CLT [phone]

## 2017-08-27 ENCOUNTER — Ambulatory Visit (HOSPITAL_COMMUNITY): Payer: Medicare Other

## 2017-08-30 ENCOUNTER — Encounter (HOSPITAL_COMMUNITY): Payer: Medicare Other

## 2017-09-02 ENCOUNTER — Encounter (HOSPITAL_COMMUNITY): Payer: Medicare Other

## 2017-09-06 ENCOUNTER — Encounter (HOSPITAL_COMMUNITY): Payer: Medicare Other

## 2017-09-09 ENCOUNTER — Encounter (HOSPITAL_COMMUNITY): Payer: Medicare Other

## 2017-09-13 ENCOUNTER — Encounter (HOSPITAL_COMMUNITY): Payer: Medicare Other

## 2017-11-03 ENCOUNTER — Encounter: Payer: Self-pay | Admitting: Internal Medicine

## 2017-11-25 ENCOUNTER — Other Ambulatory Visit (HOSPITAL_COMMUNITY): Payer: Self-pay | Admitting: Orthopedic Surgery

## 2017-11-25 ENCOUNTER — Ambulatory Visit (HOSPITAL_COMMUNITY)
Admission: RE | Admit: 2017-11-25 | Discharge: 2017-11-25 | Disposition: A | Discharge status: Home / Self Care | Payer: Medicare Other | Source: Ambulatory Visit | Attending: Orthopedic Surgery | Admitting: Orthopedic Surgery

## 2017-11-25 DIAGNOSIS — M79661 Pain in right lower leg: Secondary | ICD-10-CM

## 2017-11-25 DIAGNOSIS — R936 Abnormal findings on diagnostic imaging of limbs: Secondary | ICD-10-CM | POA: Insufficient documentation

## 2017-11-25 DIAGNOSIS — M7989 Other specified soft tissue disorders: Secondary | ICD-10-CM | POA: Diagnosis not present

## 2017-11-25 DIAGNOSIS — R609 Edema, unspecified: Secondary | ICD-10-CM

## 2017-11-29 ENCOUNTER — Other Ambulatory Visit (HOSPITAL_COMMUNITY): Payer: Self-pay | Admitting: Orthopedic Surgery

## 2017-11-29 DIAGNOSIS — E8770 Fluid overload, unspecified: Secondary | ICD-10-CM

## 2017-12-03 ENCOUNTER — Ambulatory Visit (HOSPITAL_COMMUNITY): Payer: Medicare Other

## 2018-03-14 ENCOUNTER — Encounter: Payer: Self-pay | Admitting: Internal Medicine

## 2019-03-07 IMAGING — US US EXTREM LOW VENOUS*R*
1 series · 13 of 24 positions shown · non-contrast
Comparison: None.

CLINICAL DATA: 71-year-old male with a history of right lower
extremity pain



[Series 1: us extrem low venous*right* · 0.08mm/px · 13 of 42 slices shown]
[im 1/42]
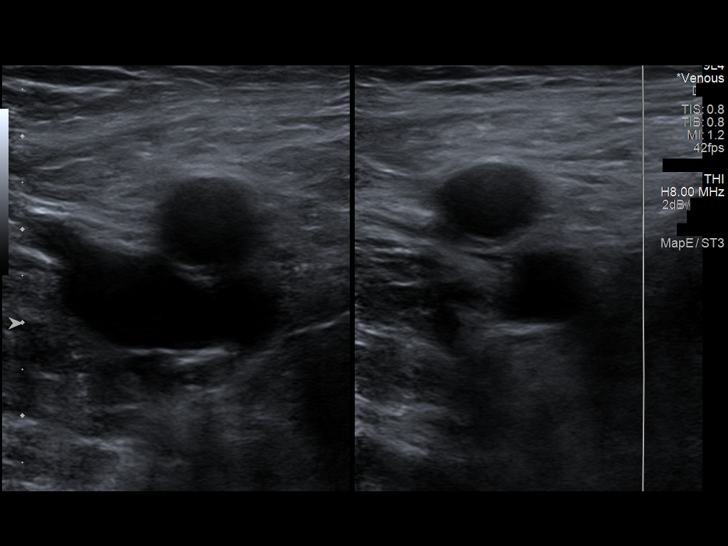
[im 4/42]
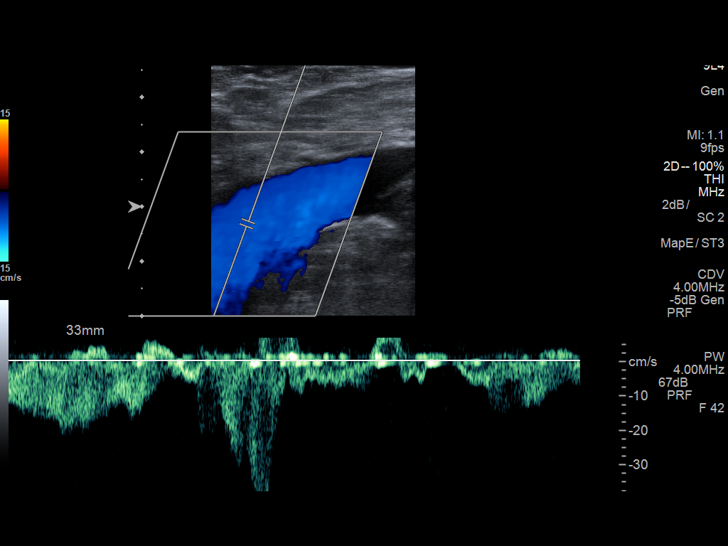
[im 8/42]
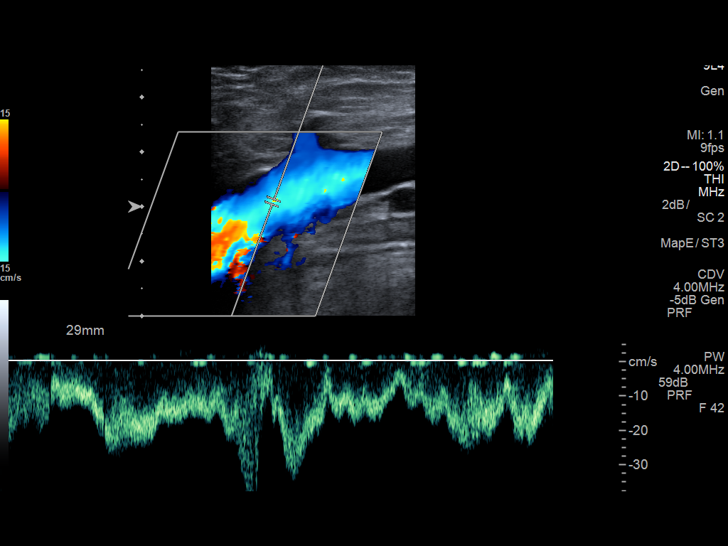
[im 11/42]
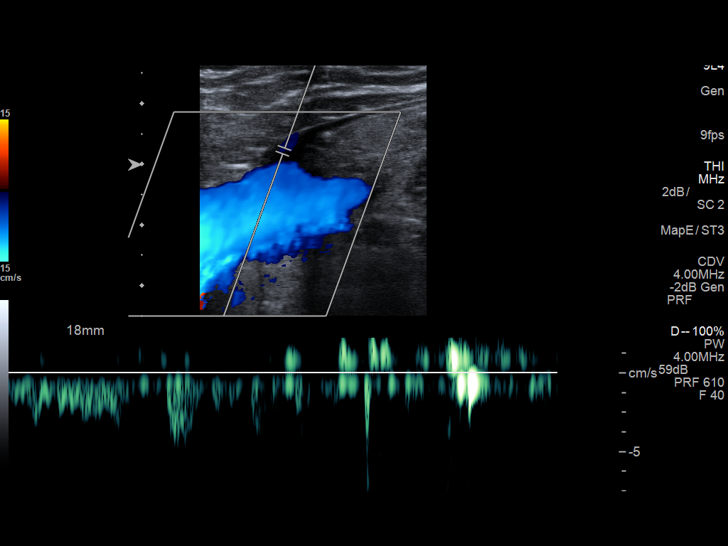
[im 15/42]
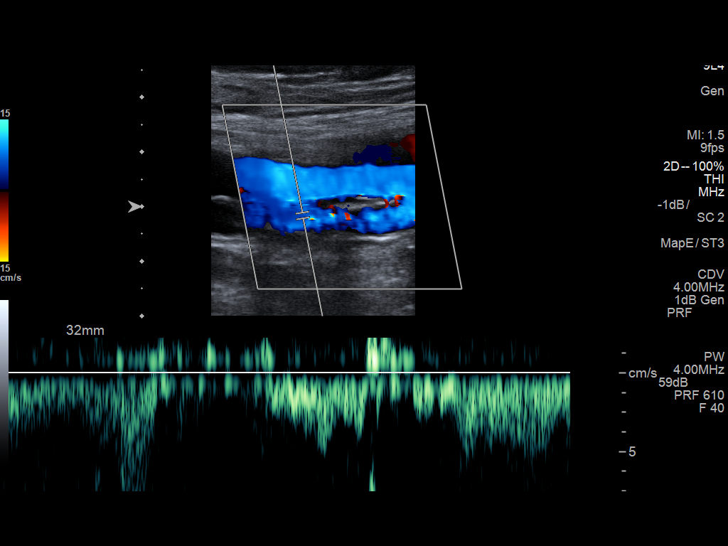
[im 18/42]
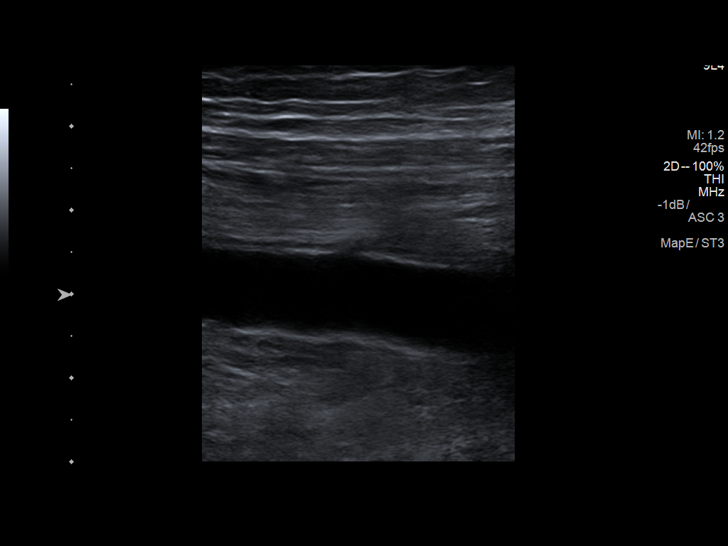
[im 22/42]
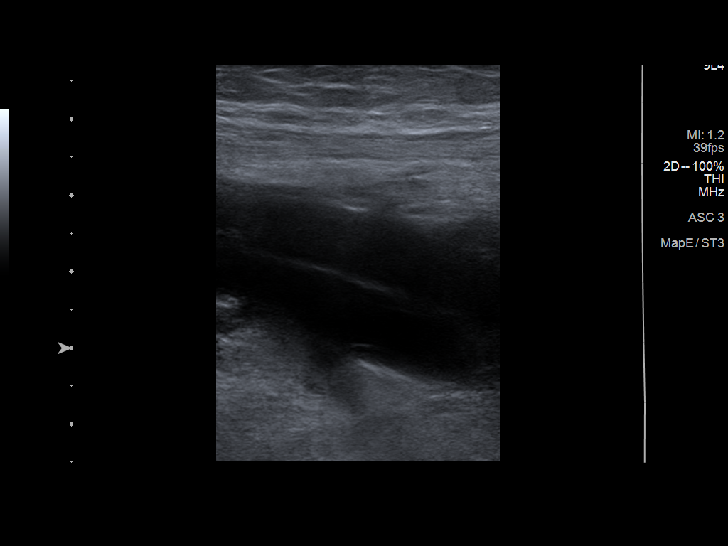
[im 24/42]
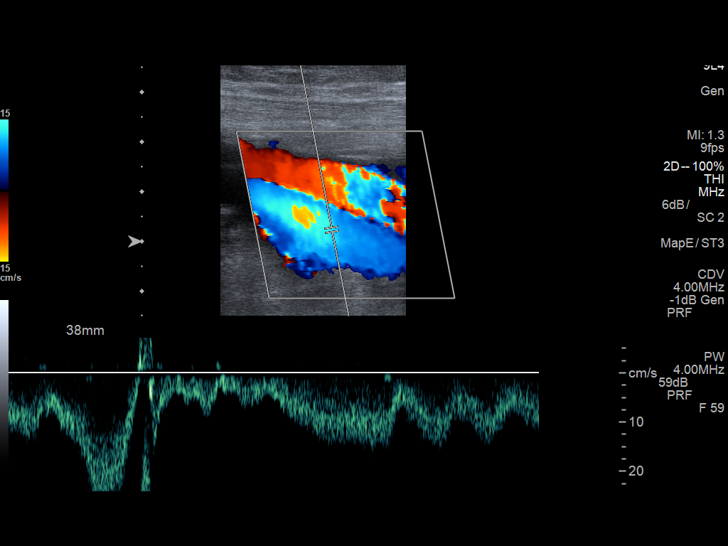
[im 27/42]
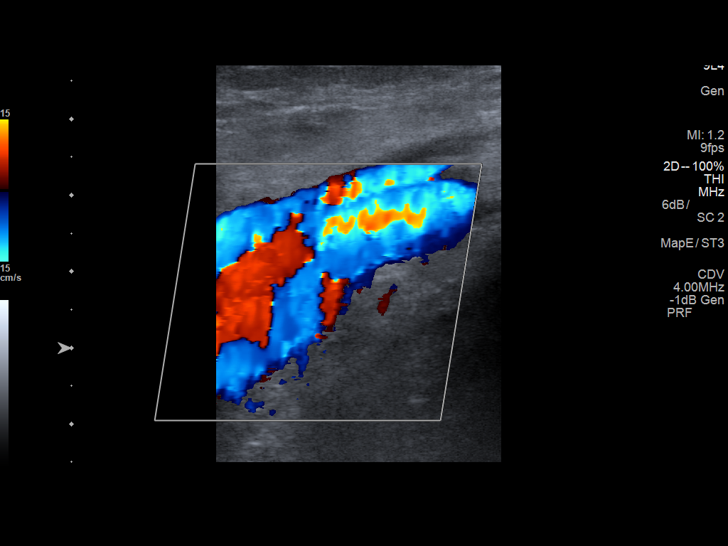
[im 31/42]
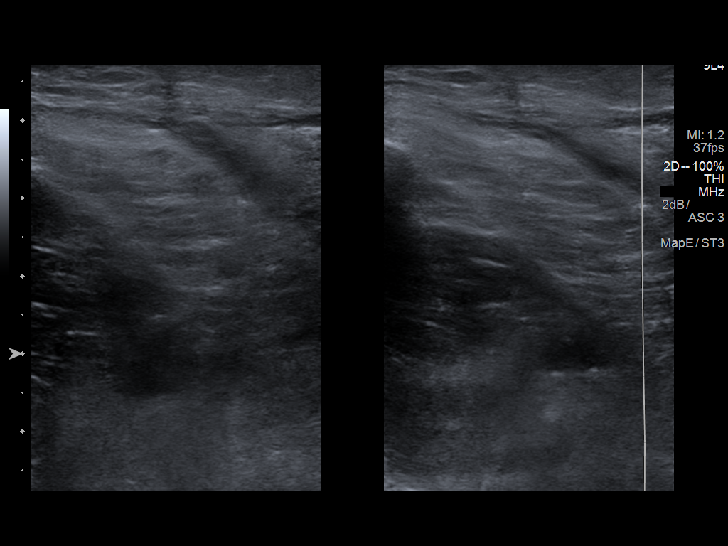
[im 34/42]
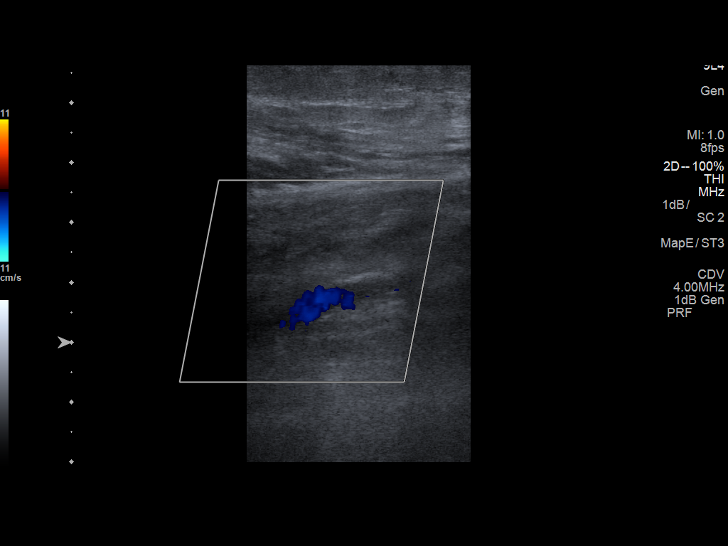
[im 38/42]
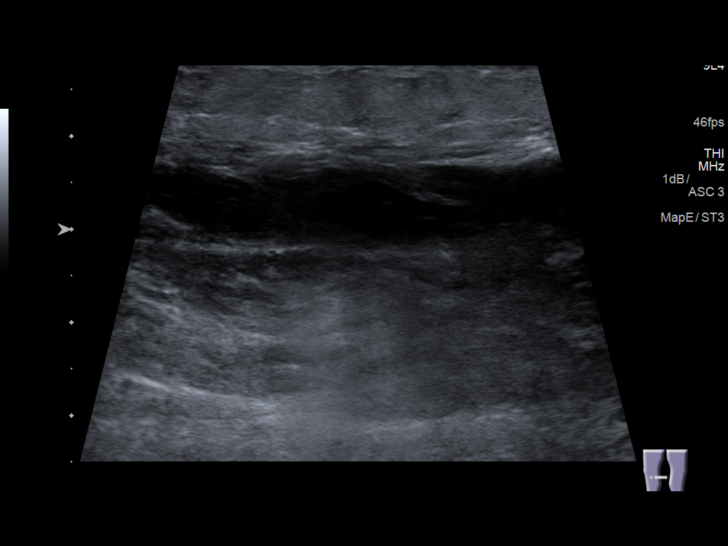
[im 42/42]
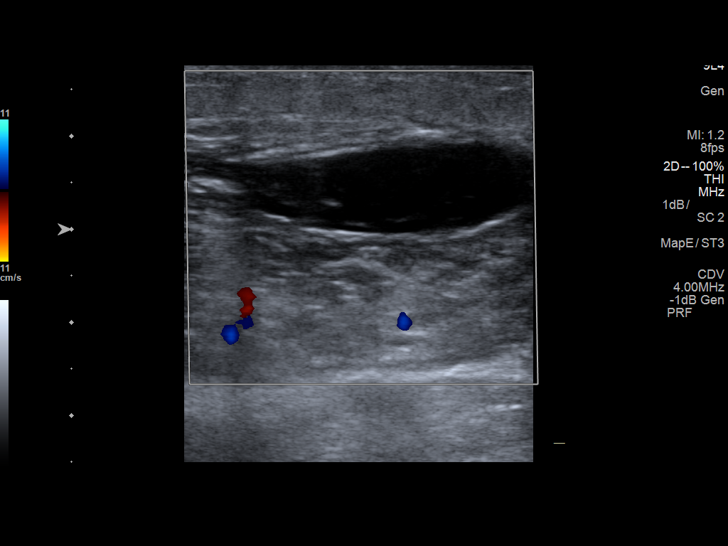

[13 of 24 positions shown; findings below may reference images not displayed]

FINDINGS: Contralateral Common Femoral Vein: Respiratory phasicity is normal
and symmetric with the symptomatic side. No evidence of thrombus.
Normal compressibility.

Common Femoral Vein: No evidence of thrombus. Normal
compressibility, respiratory phasicity and response to augmentation.

Saphenofemoral Junction: No evidence of thrombus. Normal
compressibility and flow on color Doppler imaging.

Profunda Femoral Vein: No evidence of thrombus. Normal
compressibility and flow on color Doppler imaging.

Femoral Vein: No evidence of thrombus. Normal compressibility,
respiratory phasicity and response to augmentation.

Popliteal Vein: No evidence of thrombus. Normal compressibility,
respiratory phasicity and response to augmentation.

Calf Veins: No evidence of thrombus. Normal compressibility and flow
on color Doppler imaging.

Superficial Great Saphenous Vein: No evidence of thrombus. Normal
compressibility and flow on color Doppler imaging.

Other Findings: Lentiform heterogeneously echoic fluid collection in
the upper medial right calf measuring 8.8 cm x 0.9 cm x 4.1 cm. No
internal flow.
IMPRESSION: Sonographic survey of the right lower extremity negative for DVT.

Lentiform fluid collection in the medial right upper calf,
potentially seroma, hematoma, less likely infection

## 2019-04-13 ENCOUNTER — Other Ambulatory Visit: Payer: Self-pay

## 2019-04-13 ENCOUNTER — Ambulatory Visit: Payer: Medicare Other | Admitting: Internal Medicine

## 2019-04-13 ENCOUNTER — Encounter: Payer: Self-pay | Admitting: Internal Medicine

## 2019-04-13 VITALS — BP 146/86 | HR 90 | Temp 96.9°F | Ht 72.0 in | Wt 188.2 lb

## 2019-04-13 DIAGNOSIS — K219 Gastro-esophageal reflux disease without esophagitis: Secondary | ICD-10-CM

## 2019-04-13 NOTE — Progress Notes (Signed)
Primary Care Physician:  Celene Squibb, MD Primary Gastroenterologist:  Dr. Gala Romney  Pre-Procedure History & Physical: HPI:  Spencer [name] is a 73 y.o. male here for left-sided proctocolitis -  seen at colonoscopy 2018.  Etiology uncertain.  Symptoms resolved.  He has had 1 bout of rectal bleeding associated with straining in the past 2 years but otherwise absolutely devoid of any lower GI tract symptoms such as diarrhea, constipation/ tenesmus.  He feels well no abdominal pain.  Distant history of colonic adenoma; plans were to perform 1 more surveillance colonoscopy in 2023.  Reflux symptoms controlled with alternating Prilosec/famotidine as needed.  No dysphagia.  No weight loss.  Unfortunately, patient lost a son to suicide 1 year ago.  Patient was mildly anemic with a hemoglobin in the 11 range back in 2018.  He states recent labs at Dr. Juel Burrow were okay.  Past Medical History:  Diagnosis Date  . 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 Fracture    ribs/ right wrist x3/ left ankle/cracked vertebrae-cervical/  . GERD (gastroesophageal reflux disease)   . Primary localized osteoarthritis of left knee   . Primary localized osteoarthritis of right knee     Past Surgical History:  Procedure Laterality Date  . BASAL CELL CARCINOMA EXCISION    . COLONOSCOPY  07/04/2004   RMR: Diminutive rectal polyps, removed with cold biopsy forceps, otherwise normal/  Minimal left-sided diverticula, the remainder of the colonic mucosa normal. Hyerplastic  . COLONOSCOPY N/A 03/27/2013   Dr.Sarae Nicholes-prominent internal hemorrhoids o/w normal rectum. scattered sigmoid diverticula with associated areas of submucosal petechiae. two 85mm diminutive polypoid lesions opposite the ileocecal valve the remainder of the colonic mucosa appeared normal. bx= tubular adenoma, benign hyperemic colorectal mucosa with focal active colitis  . COLONOSCOPY N/A 05/26/2017   Procedure: COLONOSCOPY;  Surgeon:  [name], MD;  [facility];  Service: Endoscopy;  Laterality: N/A;  7:30am  . FOOT SURGERY Left    X 2-otif  . FOOT SURGERY Right 2003  . HEMORRHOID BANDING    . KNEE SURGERY Left 1973   open  . OLECRANON BURSECTOMY Right 03/29/2014   Procedure: EXCISION OF RIGHT OLECRANON BURSA;  Surgeon: [name], MD;  Location: [facility];  Service: Orthopedics;  Laterality: Right;  . TOTAL KNEE ARTHROPLASTY Left 06/29/2016   Procedure: LEFT TOTAL KNEE ARTHROPLASTY;  Surgeon: [name], MD;  Location: [place];  Service: Orthopedics;  Laterality: Left;  . TOTAL KNEE ARTHROPLASTY Right 06/28/2017   Procedure: TOTAL KNEE ARTHROPLASTY;  Surgeon: [name], MD;  Location: [place];  Service: Orthopedics;  Laterality: Right;    Prior to Admission medications   Medication Sig Start Date End Date Taking? Authorizing Provider  acetaminophen (TYLENOL) 500 MG tablet Take 1,000 mg every 6 (six) hours as needed by mouth for moderate pain or headache.   Yes [provider]  ALPRAZolam Duanne Moron) 1 MG tablet Take 1 mg by mouth at bedtime as needed for anxiety.   Yes [provider]  amLODipine (NORVASC) 5 MG tablet Take 1 tablet by mouth daily. 04/07/19  Yes [provider]  cholecalciferol (VITAMIN D3) 25 MCG (1000 UT) tablet Take 1,000 Units by mouth daily.   Yes [provider]  Emollient (VASELINE INTENSIVE CARE EX) Apply 1 application at bedtime topically.    Yes [provider]  famotidine (PEPCID AC) 10 MG chewable tablet Chew 10 mg by mouth at bedtime as needed  for heartburn.    Yes [provider]  Melatonin 3 MG TABS Take 3 mg by mouth at bedtime.   Yes [provider]  Multiple Vitamin (MULTIVITAMIN WITH MINERALS) TABS tablet Take 1 tablet by mouth daily.   Yes [provider]  Omega-3 Fatty Acids (FISH OIL) 1200 MG CAPS Take 1,200 mg by mouth daily with supper.   Yes [provider]  omeprazole (PRILOSEC OTC)  20 MG tablet Take 20 mg by mouth daily as needed (for late night snacking).   Yes [provider]  Royal at bedtime as needed for sleep   Yes [provider]  Psyllium (METAMUCIL PO) Take by mouth. 1 tsp daily   Yes [provider]  vitamin B-12 (CYANOCOBALAMIN) 1000 MCG tablet Take 1,000 mcg by mouth daily.   Yes [provider]    Allergies as of 04/13/2019  . (No Known Allergies)    Family History  Problem Relation Age of Onset  . Heart failure Mother   . COPD Sister   . Colon cancer Neg Hx     Social History   Socioeconomic History  . Marital status: Married    Spouse name: Not on file  . Number of children: Not on file  . Years of education: Not on file  . Highest education level: Not on file  Occupational History  . Occupation: retired    Comment: Scientist, physiological of Students at [employer]  . Financial resource strain: Not on file  . Food insecurity    Worry: Not on file    Inability: Not on file  . Transportation needs    Medical: Not on file    Non-medical: Not on file  Tobacco Use  . Smoking status: Former Smoker    Packs/day: 0.50    Years: 5.00    Pack years: 2.50    Types: Cigarettes    Quit date: 03/27/1975    Years since quitting: 44.0  . Smokeless tobacco: Former Systems developer    Quit date: 08/03/1978  Substance and Sexual Activity  . Alcohol use: Yes    Comment: beer daily 2-3  . Drug use: No  . Sexual activity: Yes    Birth control/protection: None  Lifestyle  . Physical activity    Days per week: Not on file    Minutes per session: Not on file  . Stress: Not on file  Relationships  . Social Herbalist on phone: Not on file    Gets together: Not on file    Attends religious service: Not on file    Active member of club or organization: Not on file    Attends meetings of clubs or organizations: Not on file    Relationship status: Not on file  . Intimate partner violence    Fear  of current or ex partner: Not on file    Emotionally abused: Not on file    Physically abused: Not on file    Forced sexual activity: Not on file  Other Topics Concern  . Not on file  Social History Narrative  . Not on file    Review of Systems: See HPI, otherwise negative ROS  Physical Exam: BP (!) 146/86   Pulse 90   Temp (!) 96.9 F (36.1 C) (Temporal)   Ht 6' (1.829 m)   Wt 188 lb 3.2 oz (85.4 kg)   BMI 25.52 kg/m  General:   Alert,  Well-developed, well-nourished,  pleasant and cooperative in NAD Neck:  Supple; no masses or thyromegaly. No significant cervical adenopathy. Lungs:  Clear throughout to auscultation.   No wheezes, crackles, or rhonchi. No acute distress. Heart:  Regular rate and rhythm; no murmurs, clicks, rubs,  or gallops. Abdomen: Non-distended, normal bowel sounds.  Soft and nontender without appreciable mass or hepatosplenomegaly.  Pulses:  Normal pulses noted. Extremities:  Without clubbing or edema.  Impression: Pleasant 73 year old gentleman with a distant history of colonic adenoma, bout of apparently self-limiting proctocolitis 2 years ago.  No symptoms since that time aside from one episode of hematochezia with straining -  one episode paper in volume.  Doing well otherwise.  GERD symptoms controlled on on demand PPI/H2 blocker.  We talked about the theoretical risk.  We talked about the low incidence established associations including increased risk of infection.   Recommendations:Need to get CBC from Dr. Durene Cal office for review  As long no lower GI symptoms, we can hold off on colonoscopy until 2023 per plan.  May use Prilosec daily as needed with pepsid on demand for flares of heartburn.  Office visit in 1 year  If bleeding or other symptoms develop, please let me know      Notice: This dictation was prepared with Dragon dictation along with smaller phrase technology. Any transcriptional errors that result from this process are  unintentional and may not be corrected upon review.

## 2019-04-13 NOTE — Patient Instructions (Signed)
Need to get CBC from Dr. Durene Cal office for review  As long no lower GI symptoms, we can hold off on colonoscopy until 2023 per plan.  May use Prilosec daily as needed with pepsid on demand for flares of heartburn.  Office visit in 1 year  If bleeding or other symptoms develop, please let me know

## 2019-05-16 ENCOUNTER — Other Ambulatory Visit: Payer: Self-pay

## 2019-05-16 DIAGNOSIS — Z20822 Contact with and (suspected) exposure to covid-19: Secondary | ICD-10-CM

## 2019-05-18 LAB — NOVEL CORONAVIRUS, NAA: SARS-CoV-2, NAA: NOT DETECTED

## 2019-05-19 ENCOUNTER — Telehealth: Payer: Self-pay | Admitting: Hematology

## 2019-05-19 NOTE — Telephone Encounter (Signed)
Pt is aware covid 19 test is neg  

## 2019-06-16 ENCOUNTER — Other Ambulatory Visit: Payer: Self-pay

## 2019-06-16 DIAGNOSIS — Z20822 Contact with and (suspected) exposure to covid-19: Secondary | ICD-10-CM

## 2019-06-18 LAB — NOVEL CORONAVIRUS, NAA: SARS-CoV-2, NAA: NOT DETECTED

## 2019-06-19 ENCOUNTER — Telehealth: Payer: Self-pay | Admitting: *Deleted

## 2019-06-19 NOTE — Telephone Encounter (Signed)
Pt calling for covid results; negative. Verbalizes understanding.

## 2019-08-23 DIAGNOSIS — D485 Neoplasm of uncertain behavior of skin: Secondary | ICD-10-CM | POA: Diagnosis not present

## 2019-08-23 DIAGNOSIS — Z85828 Personal history of other malignant neoplasm of skin: Secondary | ICD-10-CM | POA: Diagnosis not present

## 2019-08-23 DIAGNOSIS — C44319 Basal cell carcinoma of skin of other parts of face: Secondary | ICD-10-CM | POA: Diagnosis not present

## 2019-08-23 DIAGNOSIS — L821 Other seborrheic keratosis: Secondary | ICD-10-CM | POA: Diagnosis not present

## 2019-09-11 ENCOUNTER — Ambulatory Visit: Payer: Medicare Other

## 2019-09-11 DIAGNOSIS — C4401 Basal cell carcinoma of skin of lip: Secondary | ICD-10-CM | POA: Diagnosis not present

## 2019-09-11 DIAGNOSIS — Z85828 Personal history of other malignant neoplasm of skin: Secondary | ICD-10-CM | POA: Diagnosis not present

## 2020-02-20 DIAGNOSIS — L821 Other seborrheic keratosis: Secondary | ICD-10-CM | POA: Diagnosis not present

## 2020-02-20 DIAGNOSIS — L570 Actinic keratosis: Secondary | ICD-10-CM | POA: Diagnosis not present

## 2020-02-20 DIAGNOSIS — D485 Neoplasm of uncertain behavior of skin: Secondary | ICD-10-CM | POA: Diagnosis not present

## 2020-02-20 DIAGNOSIS — Z85828 Personal history of other malignant neoplasm of skin: Secondary | ICD-10-CM | POA: Diagnosis not present

## 2020-02-20 DIAGNOSIS — C44319 Basal cell carcinoma of skin of other parts of face: Secondary | ICD-10-CM | POA: Diagnosis not present

## 2020-02-20 DIAGNOSIS — L820 Inflamed seborrheic keratosis: Secondary | ICD-10-CM | POA: Diagnosis not present

## 2020-02-20 DIAGNOSIS — D0439 Carcinoma in situ of skin of other parts of face: Secondary | ICD-10-CM | POA: Diagnosis not present

## 2020-03-12 DIAGNOSIS — C44319 Basal cell carcinoma of skin of other parts of face: Secondary | ICD-10-CM | POA: Diagnosis not present

## 2020-03-12 DIAGNOSIS — Z85828 Personal history of other malignant neoplasm of skin: Secondary | ICD-10-CM | POA: Diagnosis not present

## 2020-04-09 DIAGNOSIS — E663 Overweight: Secondary | ICD-10-CM | POA: Diagnosis not present

## 2020-04-09 DIAGNOSIS — I10 Essential (primary) hypertension: Secondary | ICD-10-CM | POA: Diagnosis not present

## 2020-04-09 DIAGNOSIS — F064 Anxiety disorder due to known physiological condition: Secondary | ICD-10-CM | POA: Diagnosis not present

## 2020-04-09 DIAGNOSIS — Z0001 Encounter for general adult medical examination with abnormal findings: Secondary | ICD-10-CM | POA: Diagnosis not present

## 2020-04-09 DIAGNOSIS — F4321 Adjustment disorder with depressed mood: Secondary | ICD-10-CM | POA: Diagnosis not present

## 2020-04-09 DIAGNOSIS — G4709 Other insomnia: Secondary | ICD-10-CM | POA: Diagnosis not present

## 2020-04-09 DIAGNOSIS — F1094 Alcohol use, unspecified with alcohol-induced mood disorder: Secondary | ICD-10-CM | POA: Diagnosis not present

## 2020-04-09 DIAGNOSIS — F1020 Alcohol dependence, uncomplicated: Secondary | ICD-10-CM | POA: Diagnosis not present

## 2020-04-09 DIAGNOSIS — G4700 Insomnia, unspecified: Secondary | ICD-10-CM | POA: Diagnosis not present

## 2020-04-12 DIAGNOSIS — Z23 Encounter for immunization: Secondary | ICD-10-CM | POA: Diagnosis not present

## 2020-04-12 DIAGNOSIS — F5221 Male erectile disorder: Secondary | ICD-10-CM | POA: Diagnosis not present

## 2020-04-12 DIAGNOSIS — F1020 Alcohol dependence, uncomplicated: Secondary | ICD-10-CM | POA: Diagnosis not present

## 2020-04-12 DIAGNOSIS — I10 Essential (primary) hypertension: Secondary | ICD-10-CM | POA: Diagnosis not present

## 2020-04-12 DIAGNOSIS — E7849 Other hyperlipidemia: Secondary | ICD-10-CM | POA: Diagnosis not present

## 2020-04-12 DIAGNOSIS — Z0001 Encounter for general adult medical examination with abnormal findings: Secondary | ICD-10-CM | POA: Diagnosis not present

## 2020-04-12 DIAGNOSIS — F064 Anxiety disorder due to known physiological condition: Secondary | ICD-10-CM | POA: Diagnosis not present

## 2020-04-12 DIAGNOSIS — G4700 Insomnia, unspecified: Secondary | ICD-10-CM | POA: Diagnosis not present

## 2020-07-10 DIAGNOSIS — H43813 Vitreous degeneration, bilateral: Secondary | ICD-10-CM | POA: Diagnosis not present

## 2020-09-05 DIAGNOSIS — R0981 Nasal congestion: Secondary | ICD-10-CM | POA: Diagnosis not present

## 2020-09-05 DIAGNOSIS — J019 Acute sinusitis, unspecified: Secondary | ICD-10-CM | POA: Diagnosis not present

## 2021-01-06 DIAGNOSIS — L97522 Non-pressure chronic ulcer of other part of left foot with fat layer exposed: Secondary | ICD-10-CM | POA: Diagnosis not present

## 2021-01-06 DIAGNOSIS — M792 Neuralgia and neuritis, unspecified: Secondary | ICD-10-CM | POA: Diagnosis not present

## 2021-01-06 DIAGNOSIS — M2042 Other hammer toe(s) (acquired), left foot: Secondary | ICD-10-CM | POA: Diagnosis not present

## 2021-01-20 DIAGNOSIS — L97522 Non-pressure chronic ulcer of other part of left foot with fat layer exposed: Secondary | ICD-10-CM | POA: Diagnosis not present

## 2021-02-17 DIAGNOSIS — L97522 Non-pressure chronic ulcer of other part of left foot with fat layer exposed: Secondary | ICD-10-CM | POA: Diagnosis not present

## 2021-02-20 DIAGNOSIS — C44519 Basal cell carcinoma of skin of other part of trunk: Secondary | ICD-10-CM | POA: Diagnosis not present

## 2021-02-20 DIAGNOSIS — D225 Melanocytic nevi of trunk: Secondary | ICD-10-CM | POA: Diagnosis not present

## 2021-02-20 DIAGNOSIS — L821 Other seborrheic keratosis: Secondary | ICD-10-CM | POA: Diagnosis not present

## 2021-02-20 DIAGNOSIS — D044 Carcinoma in situ of skin of scalp and neck: Secondary | ICD-10-CM | POA: Diagnosis not present

## 2021-02-20 DIAGNOSIS — D045 Carcinoma in situ of skin of trunk: Secondary | ICD-10-CM | POA: Diagnosis not present

## 2021-02-20 DIAGNOSIS — L570 Actinic keratosis: Secondary | ICD-10-CM | POA: Diagnosis not present

## 2021-02-20 DIAGNOSIS — C44612 Basal cell carcinoma of skin of right upper limb, including shoulder: Secondary | ICD-10-CM | POA: Diagnosis not present

## 2021-02-20 DIAGNOSIS — L814 Other melanin hyperpigmentation: Secondary | ICD-10-CM | POA: Diagnosis not present

## 2021-02-20 DIAGNOSIS — Z85828 Personal history of other malignant neoplasm of skin: Secondary | ICD-10-CM | POA: Diagnosis not present

## 2021-03-10 DIAGNOSIS — L97522 Non-pressure chronic ulcer of other part of left foot with fat layer exposed: Secondary | ICD-10-CM | POA: Diagnosis not present

## 2021-03-20 DIAGNOSIS — C44519 Basal cell carcinoma of skin of other part of trunk: Secondary | ICD-10-CM | POA: Diagnosis not present

## 2021-03-20 DIAGNOSIS — Z85828 Personal history of other malignant neoplasm of skin: Secondary | ICD-10-CM | POA: Diagnosis not present

## 2021-03-24 DIAGNOSIS — L97522 Non-pressure chronic ulcer of other part of left foot with fat layer exposed: Secondary | ICD-10-CM | POA: Diagnosis not present

## 2021-03-27 DIAGNOSIS — D044 Carcinoma in situ of skin of scalp and neck: Secondary | ICD-10-CM | POA: Diagnosis not present

## 2021-03-27 DIAGNOSIS — D045 Carcinoma in situ of skin of trunk: Secondary | ICD-10-CM | POA: Diagnosis not present

## 2021-03-27 DIAGNOSIS — Z85828 Personal history of other malignant neoplasm of skin: Secondary | ICD-10-CM | POA: Diagnosis not present

## 2021-04-08 DIAGNOSIS — L97522 Non-pressure chronic ulcer of other part of left foot with fat layer exposed: Secondary | ICD-10-CM | POA: Diagnosis not present

## 2021-04-24 DIAGNOSIS — L97522 Non-pressure chronic ulcer of other part of left foot with fat layer exposed: Secondary | ICD-10-CM | POA: Diagnosis not present

## 2021-05-08 DIAGNOSIS — M24575 Contracture, left foot: Secondary | ICD-10-CM | POA: Diagnosis not present

## 2021-05-23 DIAGNOSIS — I10 Essential (primary) hypertension: Secondary | ICD-10-CM | POA: Diagnosis not present

## 2021-06-02 DIAGNOSIS — Z0001 Encounter for general adult medical examination with abnormal findings: Secondary | ICD-10-CM | POA: Diagnosis not present

## 2021-06-02 DIAGNOSIS — E785 Hyperlipidemia, unspecified: Secondary | ICD-10-CM | POA: Diagnosis not present

## 2021-06-02 DIAGNOSIS — F411 Generalized anxiety disorder: Secondary | ICD-10-CM | POA: Diagnosis not present

## 2021-06-02 DIAGNOSIS — F5221 Male erectile disorder: Secondary | ICD-10-CM | POA: Diagnosis not present

## 2021-06-02 DIAGNOSIS — I10 Essential (primary) hypertension: Secondary | ICD-10-CM | POA: Diagnosis not present

## 2021-08-25 DIAGNOSIS — L814 Other melanin hyperpigmentation: Secondary | ICD-10-CM | POA: Diagnosis not present

## 2021-08-25 DIAGNOSIS — D0462 Carcinoma in situ of skin of left upper limb, including shoulder: Secondary | ICD-10-CM | POA: Diagnosis not present

## 2021-08-25 DIAGNOSIS — C44212 Basal cell carcinoma of skin of right ear and external auricular canal: Secondary | ICD-10-CM | POA: Diagnosis not present

## 2021-08-25 DIAGNOSIS — L821 Other seborrheic keratosis: Secondary | ICD-10-CM | POA: Diagnosis not present

## 2021-08-25 DIAGNOSIS — C4441 Basal cell carcinoma of skin of scalp and neck: Secondary | ICD-10-CM | POA: Diagnosis not present

## 2021-08-25 DIAGNOSIS — L570 Actinic keratosis: Secondary | ICD-10-CM | POA: Diagnosis not present

## 2021-08-25 DIAGNOSIS — L300 Nummular dermatitis: Secondary | ICD-10-CM | POA: Diagnosis not present

## 2021-08-25 DIAGNOSIS — C44519 Basal cell carcinoma of skin of other part of trunk: Secondary | ICD-10-CM | POA: Diagnosis not present

## 2021-08-25 DIAGNOSIS — Z85828 Personal history of other malignant neoplasm of skin: Secondary | ICD-10-CM | POA: Diagnosis not present

## 2021-09-25 DIAGNOSIS — Z85828 Personal history of other malignant neoplasm of skin: Secondary | ICD-10-CM | POA: Diagnosis not present

## 2021-09-25 DIAGNOSIS — C44519 Basal cell carcinoma of skin of other part of trunk: Secondary | ICD-10-CM | POA: Diagnosis not present

## 2021-09-25 DIAGNOSIS — C44212 Basal cell carcinoma of skin of right ear and external auricular canal: Secondary | ICD-10-CM | POA: Diagnosis not present

## 2021-10-08 DIAGNOSIS — M2042 Other hammer toe(s) (acquired), left foot: Secondary | ICD-10-CM | POA: Diagnosis not present

## 2021-10-08 DIAGNOSIS — L600 Ingrowing nail: Secondary | ICD-10-CM | POA: Diagnosis not present

## 2021-10-08 DIAGNOSIS — L97522 Non-pressure chronic ulcer of other part of left foot with fat layer exposed: Secondary | ICD-10-CM | POA: Diagnosis not present

## 2021-10-22 DIAGNOSIS — L600 Ingrowing nail: Secondary | ICD-10-CM | POA: Diagnosis not present

## 2021-11-27 DIAGNOSIS — Z1329 Encounter for screening for other suspected endocrine disorder: Secondary | ICD-10-CM | POA: Diagnosis not present

## 2021-11-27 DIAGNOSIS — R5383 Other fatigue: Secondary | ICD-10-CM | POA: Diagnosis not present

## 2021-11-27 DIAGNOSIS — I10 Essential (primary) hypertension: Secondary | ICD-10-CM | POA: Diagnosis not present

## 2021-11-27 DIAGNOSIS — Z13228 Encounter for screening for other metabolic disorders: Secondary | ICD-10-CM | POA: Diagnosis not present

## 2021-11-27 DIAGNOSIS — Z1321 Encounter for screening for nutritional disorder: Secondary | ICD-10-CM | POA: Diagnosis not present

## 2021-12-03 DIAGNOSIS — F411 Generalized anxiety disorder: Secondary | ICD-10-CM | POA: Diagnosis not present

## 2021-12-03 DIAGNOSIS — F5221 Male erectile disorder: Secondary | ICD-10-CM | POA: Diagnosis not present

## 2021-12-03 DIAGNOSIS — I10 Essential (primary) hypertension: Secondary | ICD-10-CM | POA: Diagnosis not present

## 2021-12-03 DIAGNOSIS — R5383 Other fatigue: Secondary | ICD-10-CM | POA: Diagnosis not present

## 2021-12-03 DIAGNOSIS — E785 Hyperlipidemia, unspecified: Secondary | ICD-10-CM | POA: Diagnosis not present

## 2022-01-05 DIAGNOSIS — C44329 Squamous cell carcinoma of skin of other parts of face: Secondary | ICD-10-CM | POA: Diagnosis not present

## 2022-01-05 DIAGNOSIS — Z85828 Personal history of other malignant neoplasm of skin: Secondary | ICD-10-CM | POA: Diagnosis not present

## 2022-01-19 DIAGNOSIS — L738 Other specified follicular disorders: Secondary | ICD-10-CM | POA: Diagnosis not present

## 2022-01-19 DIAGNOSIS — L570 Actinic keratosis: Secondary | ICD-10-CM | POA: Diagnosis not present

## 2022-01-19 DIAGNOSIS — D1801 Hemangioma of skin and subcutaneous tissue: Secondary | ICD-10-CM | POA: Diagnosis not present

## 2022-01-19 DIAGNOSIS — Z85828 Personal history of other malignant neoplasm of skin: Secondary | ICD-10-CM | POA: Diagnosis not present

## 2022-01-19 DIAGNOSIS — L821 Other seborrheic keratosis: Secondary | ICD-10-CM | POA: Diagnosis not present

## 2022-01-19 DIAGNOSIS — L820 Inflamed seborrheic keratosis: Secondary | ICD-10-CM | POA: Diagnosis not present

## 2022-03-09 DIAGNOSIS — C44329 Squamous cell carcinoma of skin of other parts of face: Secondary | ICD-10-CM | POA: Diagnosis not present

## 2022-03-09 DIAGNOSIS — Z85828 Personal history of other malignant neoplasm of skin: Secondary | ICD-10-CM | POA: Diagnosis not present

## 2022-04-08 DIAGNOSIS — B957 Other staphylococcus as the cause of diseases classified elsewhere: Secondary | ICD-10-CM | POA: Diagnosis not present

## 2022-04-08 DIAGNOSIS — L97522 Non-pressure chronic ulcer of other part of left foot with fat layer exposed: Secondary | ICD-10-CM | POA: Diagnosis not present

## 2022-04-08 DIAGNOSIS — M2042 Other hammer toe(s) (acquired), left foot: Secondary | ICD-10-CM | POA: Diagnosis not present

## 2022-04-08 DIAGNOSIS — L080 Pyoderma: Secondary | ICD-10-CM | POA: Diagnosis not present

## 2022-04-17 DIAGNOSIS — L97522 Non-pressure chronic ulcer of other part of left foot with fat layer exposed: Secondary | ICD-10-CM | POA: Diagnosis not present

## 2022-04-21 DIAGNOSIS — H43812 Vitreous degeneration, left eye: Secondary | ICD-10-CM | POA: Diagnosis not present

## 2022-04-23 DIAGNOSIS — L97522 Non-pressure chronic ulcer of other part of left foot with fat layer exposed: Secondary | ICD-10-CM | POA: Diagnosis not present

## 2022-04-29 DIAGNOSIS — L97522 Non-pressure chronic ulcer of other part of left foot with fat layer exposed: Secondary | ICD-10-CM | POA: Diagnosis not present

## 2022-05-13 DIAGNOSIS — L97522 Non-pressure chronic ulcer of other part of left foot with fat layer exposed: Secondary | ICD-10-CM | POA: Diagnosis not present

## 2022-05-21 DIAGNOSIS — L97512 Non-pressure chronic ulcer of other part of right foot with fat layer exposed: Secondary | ICD-10-CM | POA: Diagnosis not present

## 2022-05-25 DIAGNOSIS — I10 Essential (primary) hypertension: Secondary | ICD-10-CM | POA: Diagnosis not present

## 2022-05-25 DIAGNOSIS — Z125 Encounter for screening for malignant neoplasm of prostate: Secondary | ICD-10-CM | POA: Diagnosis not present

## 2022-05-25 DIAGNOSIS — R5383 Other fatigue: Secondary | ICD-10-CM | POA: Diagnosis not present

## 2022-05-29 DIAGNOSIS — U071 COVID-19: Secondary | ICD-10-CM | POA: Diagnosis not present

## 2022-05-29 DIAGNOSIS — Z1152 Encounter for screening for COVID-19: Secondary | ICD-10-CM | POA: Diagnosis not present

## 2022-06-05 ENCOUNTER — Other Ambulatory Visit (HOSPITAL_COMMUNITY): Payer: Self-pay | Admitting: Family Medicine

## 2022-06-05 DIAGNOSIS — F411 Generalized anxiety disorder: Secondary | ICD-10-CM | POA: Diagnosis not present

## 2022-06-05 DIAGNOSIS — R221 Localized swelling, mass and lump, neck: Secondary | ICD-10-CM | POA: Diagnosis not present

## 2022-06-05 DIAGNOSIS — I10 Essential (primary) hypertension: Secondary | ICD-10-CM | POA: Diagnosis not present

## 2022-06-05 DIAGNOSIS — F5221 Male erectile disorder: Secondary | ICD-10-CM | POA: Diagnosis not present

## 2022-06-05 DIAGNOSIS — H6122 Impacted cerumen, left ear: Secondary | ICD-10-CM | POA: Diagnosis not present

## 2022-06-05 DIAGNOSIS — Z0000 Encounter for general adult medical examination without abnormal findings: Secondary | ICD-10-CM | POA: Diagnosis not present

## 2022-06-09 DIAGNOSIS — L98491 Non-pressure chronic ulcer of skin of other sites limited to breakdown of skin: Secondary | ICD-10-CM | POA: Diagnosis not present

## 2022-06-09 DIAGNOSIS — L97522 Non-pressure chronic ulcer of other part of left foot with fat layer exposed: Secondary | ICD-10-CM | POA: Diagnosis not present

## 2022-06-10 ENCOUNTER — Ambulatory Visit (HOSPITAL_COMMUNITY)
Admission: RE | Admit: 2022-06-10 | Discharge: 2022-06-10 | Disposition: A | Discharge status: Home / Self Care | Payer: Medicare PPO | Source: Ambulatory Visit | Attending: Family Medicine | Admitting: Family Medicine

## 2022-06-10 DIAGNOSIS — R221 Localized swelling, mass and lump, neck: Secondary | ICD-10-CM | POA: Insufficient documentation

## 2022-06-23 DIAGNOSIS — M24575 Contracture, left foot: Secondary | ICD-10-CM | POA: Diagnosis not present

## 2022-07-07 DIAGNOSIS — M24575 Contracture, left foot: Secondary | ICD-10-CM | POA: Diagnosis not present

## 2022-07-21 DIAGNOSIS — L98499 Non-pressure chronic ulcer of skin of other sites with unspecified severity: Secondary | ICD-10-CM | POA: Diagnosis not present

## 2022-07-21 DIAGNOSIS — Z85828 Personal history of other malignant neoplasm of skin: Secondary | ICD-10-CM | POA: Diagnosis not present

## 2022-07-21 DIAGNOSIS — D1801 Hemangioma of skin and subcutaneous tissue: Secondary | ICD-10-CM | POA: Diagnosis not present

## 2022-07-21 DIAGNOSIS — L820 Inflamed seborrheic keratosis: Secondary | ICD-10-CM | POA: Diagnosis not present

## 2022-07-21 DIAGNOSIS — L111 Transient acantholytic dermatosis [Grover]: Secondary | ICD-10-CM | POA: Diagnosis not present

## 2022-07-21 DIAGNOSIS — L821 Other seborrheic keratosis: Secondary | ICD-10-CM | POA: Diagnosis not present

## 2022-07-21 DIAGNOSIS — L570 Actinic keratosis: Secondary | ICD-10-CM | POA: Diagnosis not present

## 2022-07-21 DIAGNOSIS — B078 Other viral warts: Secondary | ICD-10-CM | POA: Diagnosis not present

## 2022-08-21 ENCOUNTER — Ambulatory Visit: Payer: Medicare PPO | Admitting: Internal Medicine

## 2022-09-07 DIAGNOSIS — Z85828 Personal history of other malignant neoplasm of skin: Secondary | ICD-10-CM | POA: Diagnosis not present

## 2022-09-07 DIAGNOSIS — D692 Other nonthrombocytopenic purpura: Secondary | ICD-10-CM | POA: Diagnosis not present

## 2022-10-02 ENCOUNTER — Ambulatory Visit: Payer: Medicare PPO | Admitting: Internal Medicine

## 2022-10-09 ENCOUNTER — Ambulatory Visit: Payer: Medicare PPO | Admitting: Internal Medicine

## 2022-10-20 DIAGNOSIS — H43391 Other vitreous opacities, right eye: Secondary | ICD-10-CM | POA: Diagnosis not present

## 2022-10-27 ENCOUNTER — Ambulatory Visit: Payer: Medicare PPO | Admitting: Internal Medicine

## 2022-10-27 ENCOUNTER — Encounter: Payer: Self-pay | Admitting: *Deleted

## 2022-10-27 ENCOUNTER — Encounter: Payer: Self-pay | Admitting: Internal Medicine

## 2022-10-27 ENCOUNTER — Telehealth: Payer: Self-pay | Admitting: *Deleted

## 2022-10-27 VITALS — BP 166/90 | HR 76 | Temp 98.1°F | Ht 72.0 in | Wt 192.6 lb

## 2022-10-27 DIAGNOSIS — K219 Gastro-esophageal reflux disease without esophagitis: Secondary | ICD-10-CM

## 2022-10-27 MED ORDER — OMEPRAZOLE 40 MG PO CPDR: 40.0000 mg | DELAYED_RELEASE_CAPSULE | Freq: Every day | ORAL | 3 refills | Status: DC

## 2022-10-27 NOTE — Patient Instructions (Signed)
It was good to see you today as always!  As discussed, lets proceed with a diagnostic EGD to evaluate your esophagus given longstanding heartburn/reflux (ASA 2).  You should also have a follow-up colonoscopy but we can defer that for another year.  GERD information provided  Begin omeprazole 40 mg pill 30 minutes before breakfast daily (dispense 30 with 11 refills)  Further recommendations to follow.

## 2022-10-27 NOTE — H&P (View-Only) (Signed)
Primary Care Physician:  Celene Squibb, MD Primary Gastroenterologist:  Dr. Gala Romney  Pre-Procedure History & Physical: HPI:  Spencer [name] is a 77 y.o. male here for follow-up of GERD and history of colonic adenoma.  Patient relates he is really had reflux symptoms greater than 3 times a week for many years if not a couple of decades.  No dysphagia.  He uses a variety of OTC agents including Prilosec and Pepcid.  He has never had an EGD  Small adenoma removed 2014 acute illness characterized by bloody diarrhea in 2018.  Colitis found at that time.  Symptoms were self-limiting.  Query ischemic versus infectious etiology.  Currently, no lower GI tract symptoms.  We discussed 1 more follow-up colonoscopy next year.  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 Hyerplastic   COLONOSCOPY N/A 03/27/2013   Dr.Shalen Petrak-prominent internal hemorrhoids o/w normal rectum. scattered sigmoid diverticula with associated areas of submucosal petechiae. two 14mm diminutive polypoid lesions opposite the ileocecal valve the remainder of the colonic mucosa appeared normal. bx= tubular adenoma, benign hyperemic colorectal mucosa with focal active colitis   COLONOSCOPY N/A 05/26/2017   Procedure: COLONOSCOPY;  Surgeon: [name], MD;  [facility]: [facility];  Service: Endoscopy;  Laterality: N/A;  7:30am   FOOT SURGERY Left    X 2-otif   FOOT SURGERY Right 2003    HEMORRHOID BANDING     KNEE SURGERY Left 1973   open   OLECRANON BURSECTOMY Right 03/29/2014   Procedure: EXCISION OF RIGHT OLECRANON BURSA;  Surgeon: [name], MD;  Location: [facility];  Service: Orthopedics;  Laterality: Right;   TOTAL KNEE ARTHROPLASTY Left 06/29/2016   Procedure: LEFT TOTAL KNEE ARTHROPLASTY;  Surgeon: [name], MD;  Location: [place];  Service: Orthopedics;  Laterality: Left;   TOTAL KNEE ARTHROPLASTY Right 06/28/2017   Procedure: TOTAL KNEE ARTHROPLASTY;  Surgeon: [name], MD;  Location: [place];  Service: Orthopedics;  Laterality: Right;    Prior to Admission medications   Medication Sig Start Date End Date Taking? Authorizing Provider  acetaminophen (TYLENOL) 500 MG tablet Take 1,000 mg every 6 (six) hours as needed by mouth for moderate pain or headache.   Yes [provider]  ALPRAZolam Duanne Moron) 1 MG tablet Take 1 mg by mouth at bedtime as needed for anxiety.   Yes [provider]  amLODipine (NORVASC) 5 MG tablet Take 1 tablet by mouth daily. 04/07/19  Yes [provider]  b complex vitamins capsule Take 1 capsule by mouth daily.   Yes [provider]  cholecalciferol (VITAMIN D3) 25 MCG (1000 UT) tablet Take 1,000 Units by mouth daily.   Yes [provider]  Emollient (VASELINE INTENSIVE CARE EX) Apply 1 application  topically at bedtime.   Yes [provider]  famotidine (PEPCID AC) 10 MG chewable tablet Chew 10 mg by mouth at bedtime as needed for heartburn.  Yes [provider]  Multiple Vitamin (MULTIVITAMIN WITH MINERALS) TABS tablet Take 1 tablet by mouth daily.   Yes [provider]  Omega-3 Fatty Acids (FISH OIL) 1200 MG CAPS Take 1,200 mg by mouth daily with supper.   Yes [provider]  omeprazole (PRILOSEC OTC) 20 MG tablet Take 20 mg by mouth daily as needed (for late night snacking).   Yes [provider]  [place] at bedtime as  needed for sleep   Yes [provider]  OVER THE COUNTER MEDICATION Take 1 capsule by mouth daily. Beet Capsules   Yes [provider]  Psyllium (METAMUCIL PO) Take by mouth. 1 tsp daily   Yes [provider]  Wheat Dextrin (BENEFIBER DRINK MIX PO) Take by mouth.   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 status: Former    Packs/day: 0.50    Years: 5.00    Additional pack years: 0.00    Total pack years: 2.50    Types: Cigarettes    Quit date: 03/27/1975    Years since quitting: 47.6   Smokeless tobacco: Former    Quit date: 08/03/1978  Vaping Use   Vaping Use: Never used  Substance and Sexual Activity   Alcohol use: Yes    Comment: beer daily 2-3   Drug use: No   Sexual activity: Yes    Birth control/protection: None  Other Topics Concern   Not on file  Social History Narrative   Not on file   Social Determinants of Health   Financial Resource Strain: Not on file  Food Insecurity: Not on file  Transportation Needs: Not on file  Physical Activity: Not on file  Stress: Not on file  Social Connections: Not on file  Intimate Partner Violence: Not on file    Review of Systems: See HPI, otherwise negative ROS  Physical Exam: BP (!) 151/73 (BP Location: Right Arm, Patient Position: Sitting, Cuff Size: Large)   Pulse 84   Temp 98.1 F (36.7 C) (Oral)   Ht 6' (1.829 m)   Wt 192 lb 9.6 oz (87.4 kg)   SpO2 97%   BMI 26.12 kg/m  General:   Alert,  Well-developed, well-nourished,  pleasant and cooperative in NAD Neck:  Supple; no masses or thyromegaly. No significant cervical adenopathy. Lungs:  Clear throughout to auscultation.   No wheezes, crackles, or rhonchi. No acute distress. Heart:  Regular rate and rhythm; no murmurs, clicks, rubs,  or gallops. Abdomen: Non-distended, normal bowel sounds.  Soft and nontender without appreciable mass or hepatosplenomegaly.  Pulses:  Normal pulses noted. Extremities:  Without clubbing or edema.  Impression/Plan: 77 year old gentleman with longstanding GERD managed with the various OTC agents.  No alarm symptoms.  He is 34 and has had reflux for decades.  He ought to have an EGD to survey his upper GI tract  History of a small adenoma removed previously.  He enjoys very good health at this time.  I feel it be very reasonable to pursue 1 more colonoscopy.  We can hold off on that until next year.  Blood pressure elevated today.  Recommendations:  As discussed, lets proceed with a diagnostic EGD to evaluate your esophagus given longstanding heartburn/reflux (ASA 2).  You should also have a follow-up colonoscopy but we can defer that for another year.  GERD information provided  Begin omeprazole 40 mg pill 30 minutes before breakfast daily (dispense 30 with 11 refills).   recheck blood pressure.  Further recommendations to follow.   Notice: This dictation was prepared with Dragon dictation along with smaller phrase technology. Any transcriptional errors that result from this process are unintentional and may not be corrected upon review.

## 2022-10-27 NOTE — Telephone Encounter (Signed)
Pt called to schedule procedure. Left voicemail.  [facility]

## 2022-10-27 NOTE — Progress Notes (Unsigned)
Primary Care Physician:  Celene Squibb, MD Primary Gastroenterologist:  Dr. Gala Romney  Pre-Procedure History & Physical: HPI:  Spencer [name] is a 77 y.o. male here for follow-up of GERD and history of colonic adenoma.  Patient relates he is really had reflux symptoms greater than 3 times a week for many years if not a couple of decades.  No dysphagia.  He uses a variety of OTC agents including Prilosec and Pepcid.  He has never had an EGD  Small adenoma removed 2014 acute illness characterized by bloody diarrhea in 2018.  Colitis found at that time.  Symptoms were self-limiting.  Query ischemic versus infectious etiology.  Currently, no lower GI tract symptoms.  We discussed 1 more follow-up colonoscopy next year.  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 Hyerplastic   COLONOSCOPY N/A 03/27/2013   Dr.Lyndal Reggio-prominent internal hemorrhoids o/w normal rectum. scattered sigmoid diverticula with associated areas of submucosal petechiae. two 14mm diminutive polypoid lesions opposite the ileocecal valve the remainder of the colonic mucosa appeared normal. bx= tubular adenoma, benign hyperemic colorectal mucosa with focal active colitis   COLONOSCOPY N/A 05/26/2017   Procedure: COLONOSCOPY;  Surgeon: [name], MD;  [facility];  Service: Endoscopy;  Laterality: N/A;  7:30am   FOOT SURGERY Left    X 2-otif   FOOT SURGERY Right 2003    HEMORRHOID BANDING     KNEE SURGERY Left 1973   open   OLECRANON BURSECTOMY Right 03/29/2014   Procedure: EXCISION OF RIGHT OLECRANON BURSA;  Surgeon: [name], MD;  Location: [facility];  Service: Orthopedics;  Laterality: Right;   TOTAL KNEE ARTHROPLASTY Left 06/29/2016   Procedure: LEFT TOTAL KNEE ARTHROPLASTY;  Surgeon: [name], MD;  Location: [place];  Service: Orthopedics;  Laterality: Left;   TOTAL KNEE ARTHROPLASTY Right 06/28/2017   Procedure: TOTAL KNEE ARTHROPLASTY;  Surgeon: [name], MD;  Location: [place];  Service: Orthopedics;  Laterality: Right;    Prior to Admission medications   Medication Sig Start Date End Date Taking? Authorizing Provider  acetaminophen (TYLENOL) 500 MG tablet Take 1,000 mg every 6 (six) hours as needed by mouth for moderate pain or headache.   Yes [provider]  ALPRAZolam Duanne Moron) 1 MG tablet Take 1 mg by mouth at bedtime as needed for anxiety.   Yes [provider]  amLODipine (NORVASC) 5 MG tablet Take 1 tablet by mouth daily. 04/07/19  Yes [provider]  b complex vitamins capsule Take 1 capsule by mouth daily.   Yes [provider]  cholecalciferol (VITAMIN D3) 25 MCG (1000 UT) tablet Take 1,000 Units by mouth daily.   Yes [provider]  Emollient (VASELINE INTENSIVE CARE EX) Apply 1 application  topically at bedtime.   Yes [provider]  famotidine (PEPCID AC) 10 MG chewable tablet Chew 10 mg by mouth at bedtime as needed for heartburn.  Yes [provider]  Multiple Vitamin (MULTIVITAMIN WITH MINERALS) TABS tablet Take 1 tablet by mouth daily.   Yes [provider]  Omega-3 Fatty Acids (FISH OIL) 1200 MG CAPS Take 1,200 mg by mouth daily with supper.   Yes [provider]  omeprazole (PRILOSEC OTC) 20 MG tablet Take 20 mg by mouth daily as needed (for late night snacking).   Yes [provider]  [place] at bedtime as  needed for sleep   Yes [provider]  OVER THE COUNTER MEDICATION Take 1 capsule by mouth daily. Beet Capsules   Yes [provider]  Psyllium (METAMUCIL PO) Take by mouth. 1 tsp daily   Yes [provider]  Wheat Dextrin (BENEFIBER DRINK MIX PO) Take by mouth.   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 status: Former    Packs/day: 0.50    Years: 5.00    Additional pack years: 0.00    Total pack years: 2.50    Types: Cigarettes    Quit date: 03/27/1975    Years since quitting: 47.6   Smokeless tobacco: Former    Quit date: 08/03/1978  Vaping Use   Vaping Use: Never used  Substance and Sexual Activity   Alcohol use: Yes    Comment: beer daily 2-3   Drug use: No   Sexual activity: Yes    Birth control/protection: None  Other Topics Concern   Not on file  Social History Narrative   Not on file   Social Determinants of Health   Financial Resource Strain: Not on file  Food Insecurity: Not on file  Transportation Needs: Not on file  Physical Activity: Not on file  Stress: Not on file  Social Connections: Not on file  Intimate Partner Violence: Not on file    Review of Systems: See HPI, otherwise negative ROS  Physical Exam: BP (!) 151/73 (BP Location: Right Arm, Patient Position: Sitting, Cuff Size: Large)   Pulse 84   Temp 98.1 F (36.7 C) (Oral)   Ht 6' (1.829 m)   Wt 192 lb 9.6 oz (87.4 kg)   SpO2 97%   BMI 26.12 kg/m  General:   Alert,  Well-developed, well-nourished,  pleasant and cooperative in NAD Neck:  Supple; no masses or thyromegaly. No significant cervical adenopathy. Lungs:  Clear throughout to auscultation.   No wheezes, crackles, or rhonchi. No acute distress. Heart:  Regular rate and rhythm; no murmurs, clicks, rubs,  or gallops. Abdomen: Non-distended, normal bowel sounds.  Soft and nontender without appreciable mass or hepatosplenomegaly.  Pulses:  Normal pulses noted. Extremities:  Without clubbing or edema.  Impression/Plan: 77 year old gentleman with longstanding GERD managed with the various OTC agents.  No alarm symptoms.  He is 34 and has had reflux for decades.  He ought to have an EGD to survey his upper GI tract  History of a small adenoma removed previously.  He enjoys very good health at this time.  I feel it be very reasonable to pursue 1 more colonoscopy.  We can hold off on that until next year.  Blood pressure elevated today.  Recommendations:  As discussed, lets proceed with a diagnostic EGD to evaluate your esophagus given longstanding heartburn/reflux (ASA 2).  You should also have a follow-up colonoscopy but we can defer that for another year.  GERD information provided  Begin omeprazole 40 mg pill 30 minutes before breakfast daily (dispense 30 with 11 refills).   recheck blood pressure.  Further recommendations to follow.   Notice: This dictation was prepared with Dragon dictation along with smaller phrase technology. Any transcriptional errors that result from this process are unintentional and may not be corrected upon review.

## 2022-10-27 NOTE — Progress Notes (Signed)
Primary Care Physician:  Benita Stabile, MD Primary Gastroenterologist:  Dr.   Pre-Procedure History & Physical: HPI:  Spencer [name] is a 77 y.o. 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 Hyerplastic   COLONOSCOPY N/A 03/27/2013   Dr.Freyja Govea-prominent internal hemorrhoids o/w normal rectum. scattered sigmoid diverticula with associated areas of submucosal petechiae. two 2mm diminutive polypoid lesions opposite the ileocecal valve the remainder of the colonic mucosa appeared normal. bx= tubular adenoma, benign hyperemic colorectal mucosa with focal active colitis   COLONOSCOPY N/A 05/26/2017   Procedure: COLONOSCOPY;  Surgeon: Corbin Ade, MD;  [facility];  Service: Endoscopy;  Laterality: N/A;  7:30am   FOOT SURGERY Left    X 2-otif   FOOT SURGERY Right 2003   HEMORRHOID BANDING     KNEE SURGERY Left 1973   open   OLECRANON BURSECTOMY Right 03/29/2014   Procedure: EXCISION OF RIGHT OLECRANON BURSA;  Surgeon: Darreld Mclean, MD;  Location: [facility];  Service: Orthopedics;  Laterality: Right;   TOTAL KNEE ARTHROPLASTY Left 06/29/2016   Procedure: LEFT TOTAL KNEE ARTHROPLASTY;  Surgeon: Salvatore Marvel, MD;  Location: [facility];  Service: Orthopedics;  Laterality: Left;   TOTAL KNEE ARTHROPLASTY Right 06/28/2017   Procedure: TOTAL KNEE ARTHROPLASTY;  Surgeon: Salvatore Marvel, MD;  Location: [facility];  Service: Orthopedics;   Laterality: Right;    Prior to Admission medications   Medication Sig Start Date End Date Taking? Authorizing Provider  acetaminophen (TYLENOL) 500 MG tablet Take 1,000 mg every 6 (six) hours as needed by mouth for moderate pain or headache.   Yes [provider]  ALPRAZolam Prudy Feeler) 1 MG tablet Take 1 mg by mouth at bedtime as needed for anxiety.   Yes [provider]  amLODipine (NORVASC) 5 MG tablet Take 1 tablet by mouth daily. 04/07/19  Yes [provider]  b complex vitamins capsule Take 1 capsule by mouth daily.   Yes [provider]  cholecalciferol (VITAMIN D3) 25 MCG (1000 UT) tablet Take 1,000 Units by mouth daily.   Yes [provider]  Emollient (VASELINE INTENSIVE CARE EX) Apply 1 application  topically at bedtime.   Yes [provider]  famotidine (PEPCID AC) 10 MG chewable tablet Chew 10 mg by mouth at bedtime as needed for heartburn.    Yes [provider]  Multiple Vitamin (MULTIVITAMIN WITH MINERALS) TABS tablet Take 1 tablet by mouth daily.   Yes [provider]  Omega-3 Fatty Acids (FISH OIL) 1200 MG CAPS Take 1,200 mg by mouth daily with supper.   Yes [provider]  omeprazole (PRILOSEC OTC) 20 MG tablet Take 20 mg by mouth daily as needed (for late night snacking).   Yes [provider]  OVER THE COUNTER MEDICATION Zquil at bedtime as needed for sleep   Yes [provider]  OVER THE COUNTER MEDICATION Take 1 capsule  by mouth daily. Beet Capsules   Yes [provider]  Psyllium (METAMUCIL PO) Take by mouth. 1 tsp daily   Yes [provider]  Wheat Dextrin (BENEFIBER DRINK MIX PO) Take by mouth.   Yes [provider]    Allergies as of 10/27/2022   (No Known Allergies)    Family History  Problem Relation Age of Onset   Heart failure Mother    COPD Sister    Colon cancer Neg Hx     Social History   Socioeconomic History   Marital status:  Married    Spouse name: Not on file   Number of children: Not on file   Years of education: Not on file   Highest education level: Not on file  Occupational History   Occupation: retired    Comment: Public house manager of Students at [employer]  Tobacco Use   Smoking status: Former    Packs/day: 0.50    Years: 5.00    Additional pack years: 0.00    Total pack years: 2.50    Types: Cigarettes    Quit date: 03/27/1975    Years since quitting: 47.6   Smokeless tobacco: Former    Quit date: 08/03/1978  Vaping Use   Vaping Use: Never used  Substance and Sexual Activity   Alcohol use: Yes    Comment: beer daily 2-3   Drug use: No   Sexual activity: Yes    Birth control/protection: None  Other Topics Concern   Not on file  Social History Narrative   Not on file   Social Determinants of Health   Financial Resource Strain: Not on file  Food Insecurity: Not on file  Transportation Needs: Not on file  Physical Activity: Not on file  Stress: Not on file  Social Connections: Not on file  Intimate Partner Violence: Not on file    Review of Systems: See HPI, otherwise negative ROS  Physical Exam: BP (!) 151/73 (BP Location: Right Arm, Patient Position: Sitting, Cuff Size: Large)   Pulse 84   Temp 98.1 F (36.7 C) (Oral)   Ht 6' (1.829 m)   Wt 192 lb 9.6 oz (87.4 kg)   SpO2 97%   BMI 26.12 kg/m  General:   Alert,  Well-developed, well-nourished, pleasant and cooperative in NAD Skin:  Intact without significant lesions or rashes. Eyes:  Sclera clear, no icterus.   Conjunctiva pink. Ears:  Normal auditory acuity. Nose:  No deformity, discharge,  or lesions. Mouth:  No deformity or lesions. Neck:  Supple; no masses or thyromegaly. No significant cervical adenopathy. Lungs:  Clear throughout to auscultation.   No wheezes, crackles, or rhonchi. No acute distress. Heart:  Regular rate and rhythm; no murmurs, clicks, rubs,  or gallops. Abdomen: Non-distended, normal bowel sounds.  Soft and nontender  without appreciable mass or hepatosplenomegaly.  Pulses:  Normal pulses noted. Extremities:  Without clubbing or edema.  Impression/Plan:  ***     Notice: This dictation was prepared with Dragon dictation along with smaller phrase technology. Any transcriptional errors that result from this process are unintentional and may not be corrected upon review.

## 2022-10-27 NOTE — H&P (View-Only) (Signed)
Done

## 2022-10-27 NOTE — Telephone Encounter (Signed)
Pt has been scheduled for 11/02/22. He will come by office to pick up instructions.  [facility] PA:  Approved Authorization [id number]  Tracking [id number] Dates of service 11/02/2022 - 02/01/2023

## 2022-11-02 ENCOUNTER — Telehealth: Payer: Self-pay

## 2022-11-02 ENCOUNTER — Other Ambulatory Visit: Payer: Self-pay

## 2022-11-02 ENCOUNTER — Encounter (HOSPITAL_COMMUNITY)
Admission: RE | Disposition: A | Discharge status: Home / Self Care | Payer: Self-pay | Source: Ambulatory Visit | Attending: Internal Medicine

## 2022-11-02 ENCOUNTER — Ambulatory Visit (HOSPITAL_COMMUNITY)
Admission: RE | Admit: 2022-11-02 | Discharge: 2022-11-02 | Disposition: A | Discharge status: Home / Self Care | Payer: Medicare PPO | Source: Ambulatory Visit | Attending: Internal Medicine | Admitting: Internal Medicine

## 2022-11-02 ENCOUNTER — Ambulatory Visit (HOSPITAL_COMMUNITY): Payer: Medicare PPO | Admitting: Anesthesiology

## 2022-11-02 ENCOUNTER — Ambulatory Visit (HOSPITAL_BASED_OUTPATIENT_CLINIC_OR_DEPARTMENT_OTHER): Payer: Medicare PPO | Admitting: Anesthesiology

## 2022-11-02 ENCOUNTER — Encounter (HOSPITAL_COMMUNITY): Payer: Self-pay | Admitting: Internal Medicine

## 2022-11-02 DIAGNOSIS — Z87891 Personal history of nicotine dependence: Secondary | ICD-10-CM | POA: Diagnosis not present

## 2022-11-02 DIAGNOSIS — K449 Diaphragmatic hernia without obstruction or gangrene: Secondary | ICD-10-CM | POA: Insufficient documentation

## 2022-11-02 DIAGNOSIS — Z86010 Personal history of colonic polyps: Secondary | ICD-10-CM | POA: Diagnosis not present

## 2022-11-02 DIAGNOSIS — K2289 Other specified disease of esophagus: Secondary | ICD-10-CM | POA: Diagnosis not present

## 2022-11-02 DIAGNOSIS — K2270 Barrett's esophagus without dysplasia: Secondary | ICD-10-CM | POA: Diagnosis not present

## 2022-11-02 DIAGNOSIS — R12 Heartburn: Secondary | ICD-10-CM | POA: Diagnosis present

## 2022-11-02 DIAGNOSIS — K2100 Gastro-esophageal reflux disease with esophagitis, without bleeding: Secondary | ICD-10-CM | POA: Diagnosis not present

## 2022-11-02 HISTORY — PX: ESOPHAGOGASTRODUODENOSCOPY (EGD) WITH PROPOFOL: SHX5813

## 2022-11-02 HISTORY — PX: BIOPSY: SHX5522

## 2022-11-02 SURGERY — ESOPHAGOGASTRODUODENOSCOPY (EGD) WITH PROPOFOL
Anesthesia: General

## 2022-11-02 MED ORDER — PANTOPRAZOLE SODIUM 40 MG PO TBEC: 40.0000 mg | DELAYED_RELEASE_TABLET | Freq: Every day | ORAL | 3 refills | Status: DC

## 2022-11-02 MED ORDER — LACTATED RINGERS IV SOLN: INTRAVENOUS | Status: DC

## 2022-11-02 MED ORDER — LIDOCAINE HCL (CARDIAC) PF 100 MG/5ML IV SOSY
PREFILLED_SYRINGE | INTRAVENOUS | Status: DC | PRN
  Administered 2022-11-02: 50 mg via INTRATRACHEAL

## 2022-11-02 MED ORDER — PROPOFOL 10 MG/ML IV BOLUS
INTRAVENOUS | Status: DC | PRN
  Administered 2022-11-02: 60 mg via INTRAVENOUS
  Administered 2022-11-02: 40 mg via INTRAVENOUS
  Administered 2022-11-02: 80 mg via INTRAVENOUS

## 2022-11-02 NOTE — Transfer of Care (Signed)
Immediate Anesthesia Transfer of Care Note  Patient: Spencer [name]  Procedure(s) Performed: ESOPHAGOGASTRODUODENOSCOPY (EGD) WITH PROPOFOL BIOPSY  Patient Location: PACU  Anesthesia Type:General  Level of Consciousness: awake, alert , oriented, and patient cooperative  Airway & Oxygen Therapy: Patient Spontanous Breathing  Post-op Assessment: Report given to RN, Post -op Vital signs reviewed and stable, and Patient moving all extremities  Post vital signs: Reviewed and stable  Last Vitals:  Vitals Value Taken Time  BP 110/61 11/02/22 1051  Temp 36.8 C 11/02/22 1051  Pulse 70 11/02/22 1051  Resp 14 11/02/22 1051  SpO2 96 % 11/02/22 1051    Last Pain:  Vitals:   11/02/22 1051  TempSrc: Oral  PainSc: 0-No pain      Patients Stated Pain Goal: 9 ([id number])  Complications: No notable events documented.

## 2022-11-02 NOTE — Op Note (Signed)
[facility] Patient Name: Spencer [name] Procedure Date: 11/02/2022 10:15 AM MRN: [id number] Date of Birth: 07-30-1946 Attending MD: Norvel Richards , MD, [id number] CSN: [id number] Age: 77 Admit Type: Outpatient Procedure:                Upper GI endoscopy Indications:              Heartburn Providers:                Norvel Richards, MD, Lurline Del, RN, Dereck Leep, Technician Referring MD:              Medicines:                Propofol per Anesthesia Complications:            No immediate complications. Estimated Blood Loss:     Estimated blood loss was minimal. Procedure:                Pre-Anesthesia Assessment:                           - Prior to the procedure, a History and Physical                            was performed, and patient medications and                            allergies were reviewed. The patient's tolerance of                            previous anesthesia was also reviewed. The risks                            and benefits of the procedure and the sedation                            options and risks were discussed with the patient.                            All questions were answered, and informed consent                            was obtained. Prior Anticoagulants: The patient has                            taken no anticoagulant or antiplatelet agents. ASA                            Grade Assessment: III - A patient with severe                            systemic disease. After reviewing the risks and  benefits, the patient was deemed in satisfactory                            condition to undergo the procedure.                           After obtaining informed consent, the endoscope was                            passed under direct vision. Throughout the                            procedure, the patient's blood pressure, pulse, and                            oxygen  saturations were monitored continuously. The                            [id number]-[id number] scope was introduced through the                            mouth, and advanced to the second part of duodenum.                            The upper GI endoscopy was accomplished without                            difficulty. The patient tolerated the procedure                            well. Scope In: 10:40:11 AM Scope Out: 10:47:55 AM Total Procedure Duration: 0 hours 7 minutes 44 seconds  Findings:      Patient has confluent salmon-colored epithelium coming from the GE       junction 2 cm in an irregular undulating distribution. (2) 3 cm linear       erosions extending proximal to the salmon-colored epithelium.. Please       see photos. No nodularity. Tubular esophagus patent throughout its       course. Gastric cavity empty. 2 cm hiatal hernia. Normal gastric mucosa.       Patent pylorus.      The duodenal bulb and second portion of the duodenum were normal.       Biopsies were taken with a cold forceps for histology. Biopsies of the       salmon-colored epithelium taken for histologic study. Impression:               - Abnormal distal esophagus consistent with at                            least short segment Barrett's esophagus. Biopsied.                            Erosive reflux esophagitis.                           -  2 cm hiatal hernia. Normal duodenal bulb and                            second portion of the duodenum. Biopsied. Moderate Sedation:      Moderate (conscious) sedation was personally administered by an       anesthesia professional. The following parameters were monitored: oxygen       saturation, heart rate, blood pressure, respiratory rate, EKG, adequacy       of pulmonary ventilation, and response to care. Recommendation:           - Advance diet as tolerated.                           - Continue present medications. Begin Protonix 40                            mg pill  daily 30 minutes before breakfast. GERD                            information provided.                           - Await pathology results.                           - Return to my office in 3 months. Procedure Code(s):        --- Professional ---                           [phone], Esophagogastroduodenoscopy, flexible,                            transoral; with biopsy, single or multiple Diagnosis Code(s):        --- Professional ---                           R12, Heartburn CPT copyright 2022 American Medical Association. All rights reserved. The codes documented in this report are preliminary and upon coder review may  be revised to meet current compliance requirements. Cristopher Estimable. Carlee Vonderhaar, MD Norvel Richards, MD 11/02/2022 11:03:28 AM This report has been signed electronically. Number of Addenda: 0

## 2022-11-02 NOTE — Discharge Instructions (Signed)
EGD Discharge instructions Please read the instructions outlined below and refer to this sheet in the next few weeks. These discharge instructions provide you with general information on caring for yourself after you leave the hospital. Your doctor may also give you specific instructions. While your treatment has been planned according to the most current medical practices available, unavoidable complications occasionally occur. If you have any problems or questions after discharge, please call your doctor. ACTIVITY You may resume your regular activity but move at a slower pace for the next 24 hours.  Take frequent rest periods for the next 24 hours.  Walking will help expel (get rid of) the air and reduce the bloated feeling in your abdomen.  No driving for 24 hours (because of the anesthesia (medicine) used during the test).  You may shower.  Do not sign any important legal documents or operate any machinery for 24 hours (because of the anesthesia used during the test).  NUTRITION Drink plenty of fluids.  You may resume your normal diet.  Begin with a light meal and progress to your normal diet.  Avoid alcoholic beverages for 24 hours or as instructed by your caregiver.  MEDICATIONS You may resume your normal medications unless your caregiver tells you otherwise.  WHAT YOU CAN EXPECT TODAY You may experience abdominal discomfort such as a feeling of fullness or "gas" pains.  FOLLOW-UP Your doctor will discuss the results of your test with you.  SEEK IMMEDIATE MEDICAL ATTENTION IF ANY OF THE FOLLOWING OCCUR: Excessive nausea (feeling sick to your stomach) and/or vomiting.  Severe abdominal pain and distention (swelling).  Trouble swallowing.  Temperature over 101 F (37.8 C).  Rectal bleeding or vomiting of blood.       You have reflux esophagitis-acid burns in your esophagus.  This is reflective of poorly controlled acid reflux     you also have the appearance of scarring in your  distal esophagus (possible Barrett's esophagus).  Biopsies taken     Small hiatal hernia also present.     GERD information provided      begin Protonix 40 mg 1 pill daily 30 minutes before breakfast.  A new prescription is being sent directly to your pharmacy from        my  office        office visit with me in 3 months        at patient request, I called Gage Yildiz at [phone]  -   rolled to voicemail.  Left a message.        Further recommendations to follow pending review of pathology report             at patient request, I called Mathis Neault at [phone]

## 2022-11-02 NOTE — Telephone Encounter (Signed)
-----   Message from [name], MD sent at 11/02/2022 11:15 AM EDT -----   So apparently bumped up his omeprazole to 40 mg daily recently.  He just got a new prescription.  Will stick with omeprazole.  Please cancel the Protonix prescription for now.  Sorry.

## 2022-11-02 NOTE — Interval H&P Note (Signed)
History and Physical Interval Note:  11/02/2022 10:24 AM  Spencer [name]  has presented today for surgery, with the diagnosis of GERD.  The various methods of treatment have been discussed with the patient and family. After consideration of risks, benefits and other options for treatment, the patient has consented to  Procedure(s) with comments: ESOPHAGOGASTRODUODENOSCOPY (EGD) WITH PROPOFOL (N/A) - 9:45 am as a surgical intervention.  The patient's history has been reviewed, patient examined, no change in status, stable for surgery.  I have reviewed the patient's chart and labs.  Questions were answered to the patient's satisfaction.     [name]

## 2022-11-02 NOTE — Telephone Encounter (Signed)
Rx has been cancelled via pharmacy and taken off med list.

## 2022-11-02 NOTE — Anesthesia Preprocedure Evaluation (Signed)
Anesthesia Evaluation  Patient identified by MRN, date of birth, ID band Patient awake    Reviewed: Allergy & Precautions, H&P , NPO status , Patient's Chart, lab work & pertinent test results, reviewed documented beta blocker date and time   Airway Mallampati: II  TM Distance: >3 FB Neck ROM: full    Dental no notable dental hx.    Pulmonary neg pulmonary ROS, former smoker   Pulmonary exam normal breath sounds clear to auscultation       Cardiovascular Exercise Tolerance: Good negative cardio ROS  Rhythm:regular Rate:Normal     Neuro/Psych negative neurological ROS  negative psych ROS   GI/Hepatic negative GI ROS, Neg liver ROS,GERD  ,,  Endo/Other  negative endocrine ROS    Renal/GU negative Renal ROS  negative genitourinary   Musculoskeletal   Abdominal   Peds  Hematology negative hematology ROS (+)   Anesthesia Other Findings   Reproductive/Obstetrics negative OB ROS                             Anesthesia Physical Anesthesia Plan  ASA: 2  Anesthesia Plan: General   Post-op Pain Management:    Induction:   PONV Risk Score and Plan: Propofol infusion  Airway Management Planned:   Additional Equipment:   Intra-op Plan:   Post-operative Plan:   Informed Consent: I have reviewed the patients History and Physical, chart, labs and discussed the procedure including the risks, benefits and alternatives for the proposed anesthesia with the patient or authorized representative who has indicated his/her understanding and acceptance.     Dental Advisory Given  Plan Discussed with: CRNA  Anesthesia Plan Comments:        Anesthesia Quick Evaluation  

## 2022-11-02 NOTE — Telephone Encounter (Signed)
-----   Message from [name], MD sent at 11/02/2022 10:58 AM EDT -----   Needs new prescription Protonix 40 mg pill

## 2022-11-02 NOTE — Telephone Encounter (Signed)
Rx was sent to pharmacy on file.  

## 2022-11-02 NOTE — Interval H&P Note (Signed)
History and Physical Interval Note:  11/02/2022 10:31 AM  Spencer [name]  has presented today for surgery, with the diagnosis of GERD.  The various methods of treatment have been discussed with the patient and family. After consideration of risks, benefits and other options for treatment, the patient has consented to  Procedure(s) with comments: ESOPHAGOGASTRODUODENOSCOPY (EGD) WITH PROPOFOL (N/A) - 9:45 am as a surgical intervention.  The patient's history has been reviewed, patient examined, no change in status, stable for surgery.  I have reviewed the patient's chart and labs.  Questions were answered to the patient's satisfaction.     Spencer [name]  No change.  Denies dysphagia.  Diagnostic EGD per plan.  The risks, benefits, limitations, alternatives and imponderables have been reviewed with the patient. Potential for esophageal dilation, biopsy, etc. have also been reviewed.  Questions have been answered. All parties agreeable.

## 2022-11-03 ENCOUNTER — Encounter: Payer: Self-pay | Admitting: Internal Medicine

## 2022-11-03 LAB — SURGICAL PATHOLOGY

## 2022-11-06 NOTE — Anesthesia Postprocedure Evaluation (Signed)
Anesthesia Post Note  Patient: Spencer [name]  Procedure(s) Performed: ESOPHAGOGASTRODUODENOSCOPY (EGD) WITH PROPOFOL BIOPSY  Patient location during evaluation: Phase II Anesthesia Type: General Level of consciousness: awake Pain management: pain level controlled Vital Signs Assessment: post-procedure vital signs reviewed and stable Respiratory status: spontaneous breathing and respiratory function stable Cardiovascular status: blood pressure returned to baseline and stable Postop Assessment: no headache and no apparent nausea or vomiting Anesthetic complications: no Comments: Late entry   No notable events documented.   Last Vitals:  Vitals:   11/02/22 0851 11/02/22 1051  BP: (!) 142/80 110/61  Pulse: 65 70  Resp: 16 14  Temp: 36.6 C 36.8 C  SpO2:  96%    Last Pain:  Vitals:   11/02/22 1051  TempSrc: Oral  PainSc: 0-No pain                 Windell Norfolk

## 2022-11-09 ENCOUNTER — Encounter (HOSPITAL_COMMUNITY): Payer: Self-pay | Admitting: Internal Medicine

## 2022-12-15 ENCOUNTER — Encounter: Payer: Self-pay | Admitting: Internal Medicine

## 2022-12-16 DIAGNOSIS — J029 Acute pharyngitis, unspecified: Secondary | ICD-10-CM | POA: Diagnosis not present

## 2022-12-16 DIAGNOSIS — K2270 Barrett's esophagus without dysplasia: Secondary | ICD-10-CM | POA: Diagnosis not present

## 2022-12-16 DIAGNOSIS — J209 Acute bronchitis, unspecified: Secondary | ICD-10-CM | POA: Diagnosis not present

## 2023-01-28 DIAGNOSIS — L98499 Non-pressure chronic ulcer of skin of other sites with unspecified severity: Secondary | ICD-10-CM | POA: Diagnosis not present

## 2023-01-28 DIAGNOSIS — Z85828 Personal history of other malignant neoplasm of skin: Secondary | ICD-10-CM | POA: Diagnosis not present

## 2023-01-28 DIAGNOSIS — L738 Other specified follicular disorders: Secondary | ICD-10-CM | POA: Diagnosis not present

## 2023-01-28 DIAGNOSIS — L821 Other seborrheic keratosis: Secondary | ICD-10-CM | POA: Diagnosis not present

## 2023-01-28 DIAGNOSIS — B078 Other viral warts: Secondary | ICD-10-CM | POA: Diagnosis not present

## 2023-01-28 DIAGNOSIS — L570 Actinic keratosis: Secondary | ICD-10-CM | POA: Diagnosis not present

## 2023-01-29 DIAGNOSIS — L97522 Non-pressure chronic ulcer of other part of left foot with fat layer exposed: Secondary | ICD-10-CM | POA: Diagnosis not present

## 2023-01-29 DIAGNOSIS — M21612 Bunion of left foot: Secondary | ICD-10-CM | POA: Diagnosis not present

## 2023-02-08 DIAGNOSIS — L97512 Non-pressure chronic ulcer of other part of right foot with fat layer exposed: Secondary | ICD-10-CM | POA: Diagnosis not present

## 2023-02-15 DIAGNOSIS — L97522 Non-pressure chronic ulcer of other part of left foot with fat layer exposed: Secondary | ICD-10-CM | POA: Diagnosis not present

## 2023-02-23 ENCOUNTER — Ambulatory Visit: Payer: Medicare PPO | Admitting: Internal Medicine

## 2023-02-23 ENCOUNTER — Encounter: Payer: Self-pay | Admitting: Internal Medicine

## 2023-02-23 VITALS — BP 138/79 | HR 80 | Temp 97.7°F | Ht 72.0 in | Wt 196.6 lb

## 2023-02-23 DIAGNOSIS — K2270 Barrett's esophagus without dysplasia: Secondary | ICD-10-CM

## 2023-02-23 DIAGNOSIS — K2100 Gastro-esophageal reflux disease with esophagitis, without bleeding: Secondary | ICD-10-CM | POA: Diagnosis not present

## 2023-02-23 NOTE — Progress Notes (Unsigned)
Primary Care Physician:  Benita Stabile, MD Primary Gastroenterologist:  Dr. Jena Gauss  Pre-Procedure History & Physical: HPI:  Spencer [name] is a 77 y.o. male here for standing GERD recently found to have a short segment Barrett's without dysplasia and active reflux esophagitis.  Started Protonix 40 mg daily he went back to omeprazole 20 mg daily some hesitation about taking full dose therapy as he is read things and readers digest, etc.  No dysphagia.  Discussed the entity of Barrett's at some length today. Discussed the utility of having 1 more surveillance colonoscopy (history of colonic adenomas) when he returns next year.  Also explained to him the importance of having a repeat EGD in 3 years.  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   Fracture    ribs/ right wrist x3/ left ankle/cracked vertebrae-cervical/   GERD (gastroesophageal reflux disease)    Primary localized osteoarthritis of left knee    Primary localized osteoarthritis of right knee     Past Surgical History:  Procedure Laterality Date   BASAL CELL CARCINOMA EXCISION     BIOPSY  11/02/2022   Procedure: BIOPSY;  Surgeon: Corbin Ade, MD;  Location: [facility];  Service: Endoscopy;;   COLONOSCOPY  07/04/2004   RMR: Diminutive rectal polyps, removed with cold biopsy forceps, otherwise normal/  Minimal left-sided diverticula, the remainder of the colonic mucosa normal. Hyerplastic   COLONOSCOPY N/A 03/27/2013   Dr.Moustapha Tooker-prominent internal hemorrhoids o/w normal rectum. scattered sigmoid diverticula with associated areas of submucosal petechiae. two 2mm diminutive polypoid lesions opposite the ileocecal valve the remainder of the colonic mucosa appeared normal. bx= tubular adenoma, benign hyperemic colorectal mucosa with focal active colitis   COLONOSCOPY N/A 05/26/2017   Procedure: COLONOSCOPY;  Surgeon: Corbin Ade, MD;  Location: [facility];  Service:  Endoscopy;  Laterality: N/A;  7:30am   ESOPHAGOGASTRODUODENOSCOPY (EGD) WITH PROPOFOL N/A 11/02/2022   Procedure: ESOPHAGOGASTRODUODENOSCOPY (EGD) WITH PROPOFOL;  Surgeon: Corbin Ade, MD;  Location: [facility];  Service: Endoscopy;  Laterality: N/A;  9:45 am   FOOT SURGERY Left    X 2-otif   FOOT SURGERY Right 2003   HEMORRHOID BANDING     KNEE SURGERY Left 1973   open   OLECRANON BURSECTOMY Right 03/29/2014   Procedure: EXCISION OF RIGHT OLECRANON BURSA;  Surgeon: Darreld Mclean, MD;  Location: [facility];  Service: Orthopedics;  Laterality: Right;   TOTAL KNEE ARTHROPLASTY Left 06/29/2016   Procedure: LEFT TOTAL KNEE ARTHROPLASTY;  Surgeon: Salvatore Marvel, MD;  Location: [facility] OR;  Service: Orthopedics;  Laterality: Left;   TOTAL KNEE ARTHROPLASTY Right 06/28/2017   Procedure: TOTAL KNEE ARTHROPLASTY;  Surgeon: Salvatore Marvel, MD;  Location: [facility] OR;  Service: Orthopedics;  Laterality: Right;    Prior to Admission medications   Medication Sig Start Date End Date Taking? Authorizing Provider  acetaminophen (TYLENOL) 500 MG tablet Take 500 mg by mouth every 6 (six) hours as needed for moderate pain.   Yes [provider]  ALPRAZolam Prudy Feeler) 1 MG tablet Take 1 mg by mouth See admin instructions. Five night a week for anxiety   Yes [provider]  amLODipine (NORVASC) 5 MG tablet Take 1 tablet by mouth daily. 04/07/19  Yes [provider]  b complex vitamins capsule Take 1 capsule by mouth every other day.   Yes [provider]  cholecalciferol (VITAMIN D3) 25 MCG (1000 UT)  tablet Take 1,000 Units by mouth every other day.   Yes [provider]  DHEA 50 MG CAPS Take 50 mg by mouth daily.   Yes [provider]  Emollient (VASELINE INTENSIVE CARE EX) Apply 1 application  topically at bedtime.   Yes [provider]  famotidine (PEPCID AC) 10 MG chewable tablet Chew 10 mg by mouth at bedtime as needed for heartburn.    Yes [provider]  ibuprofen (ADVIL) 200 MG tablet Take 200 mg by mouth every 6 (six) hours as needed (Pull muscle).   Yes [provider]  Multiple Vitamin (MULTIVITAMIN WITH MINERALS) TABS tablet Take 1 tablet by mouth daily. 50+   Yes [provider]  Omega-3 Fatty Acids (FISH OIL) 1200 MG CAPS Take 1,200 mg by mouth at bedtime.   Yes [provider]  OVER THE COUNTER MEDICATION Take 1 capsule by mouth at bedtime. Beet  + co q 10 with  grape seed   Yes [provider]  Polyethyl Glycol-Propyl Glycol (SYSTANE) 0.4-0.3 % SOLN Place 1 drop into both eyes 3 (three) times daily.   Yes [provider]  Psyllium (METAMUCIL PO) Take 5 mLs by mouth at bedtime.   Yes [provider]  sodium chloride (OCEAN) 0.65 % SOLN nasal spray Place 1 spray into both nostrils at bedtime as needed for congestion (Sleep).   Yes [provider]  Wheat Dextrin (BENEFIBER DRINK MIX PO) Take 5 mLs by mouth in the morning.   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 [employer]  Tobacco Use   Smoking status: Former    Current packs/day: 0.00    Average packs/day: 0.5 packs/day for 5.0 years (2.5 ttl pk-yrs)    Types: Cigarettes    Start date: 03/26/1970    Quit date: 03/27/1975    Years since quitting: 47.9   Smokeless tobacco: Former    Quit date: 08/03/1978  Vaping Use   Vaping status: Never Used  Substance and Sexual Activity   Alcohol use: Yes    Comment: beer daily 2-3   Drug use: No   Sexual activity: Yes    Birth  control/protection: None  Other Topics Concern   Not on file  Social History Narrative   Not on file   Social Determinants of Health   Financial Resource Strain: Not on file  Food Insecurity: Not on file  Transportation Needs: Not on file  Physical Activity: Not on file  Stress: Not on file  Social Connections: Not on file  Intimate Partner Violence: Not on file    Review of Systems: See HPI, otherwise negative ROS  Physical Exam: BP 138/79 (BP Location: Right Arm, Patient Position: Sitting, Cuff Size: Normal)   Pulse 80   Temp 97.7 F (36.5 C) (Temporal)   Ht 6' (1.829 m)   Wt 196 lb 9.6 oz (89.2 kg)   SpO2 100%   BMI  26.66 kg/m  General:   Alert,  Well-developed, well-nourished, pleasant and cooperative in NAD  Impression/Plan: 77 year old gentleman with longstanding GERD, erosive reflux esophagitis and short segment Barrett's without dysplasia returns for follow-up.  Gustin risk and benefits of long-term PPI therapy.  And patient's case, benefits far outweigh the risk.  n  Recommendations: Take omeprazole every day-best taken 30 minutes before breakfast.  As discussed, the benefits of taking omeprazole daily for outweigh any theoretical risks.  Office visit in 1 year to reassess and discuss 1 more colonoscopy  It is also recommended you return in 3 years for repeat upper endoscopy.  Notice: This dictation was prepared with Dragon dictation along with smaller phrase technology. Any transcriptional errors that result from this process are unintentional and may not be corrected upon review.

## 2023-02-23 NOTE — Progress Notes (Unsigned)
Primary Care Physician:  Benita Stabile, MD Primary Gastroenterologist:  Dr.   Pre-Procedure History & Physical: HPI:  Spencer [name] is a 77 y.o. 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   Fracture    ribs/ right wrist x3/ left ankle/cracked vertebrae-cervical/   GERD (gastroesophageal reflux disease)    Primary localized osteoarthritis of left knee    Primary localized osteoarthritis of right knee     Past Surgical History:  Procedure Laterality Date   BASAL CELL CARCINOMA EXCISION     BIOPSY  11/02/2022   Procedure: BIOPSY;  Surgeon: Corbin Ade, MD;  Location: [facility];  Service: Endoscopy;;   COLONOSCOPY  07/04/2004   RMR: Diminutive rectal polyps, removed with cold biopsy forceps, otherwise normal/  Minimal left-sided diverticula, the remainder of the colonic mucosa normal. Hyerplastic   COLONOSCOPY N/A 03/27/2013   Dr.Arad Burston-prominent internal hemorrhoids o/w normal rectum. scattered sigmoid diverticula with associated areas of submucosal petechiae. two 2mm diminutive polypoid lesions opposite the ileocecal valve the remainder of the colonic mucosa appeared normal. bx= tubular adenoma, benign hyperemic colorectal mucosa with focal active colitis   COLONOSCOPY N/A 05/26/2017   Procedure: COLONOSCOPY;  Surgeon: Corbin Ade, MD;  Location: [facility];  Service: Endoscopy;  Laterality: N/A;  7:30am   ESOPHAGOGASTRODUODENOSCOPY (EGD) WITH PROPOFOL N/A 11/02/2022   Procedure: ESOPHAGOGASTRODUODENOSCOPY (EGD) WITH PROPOFOL;  Surgeon: Corbin Ade, MD;  Location: [facility];  Service: Endoscopy;  Laterality: N/A;  9:45 am   FOOT SURGERY Left    X 2-otif   FOOT SURGERY Right 2003   HEMORRHOID BANDING     KNEE SURGERY Left 1973   open   OLECRANON BURSECTOMY Right 03/29/2014   Procedure: EXCISION OF RIGHT OLECRANON BURSA;  Surgeon: Darreld Mclean, MD;  Location: [facility];  Service: Orthopedics;   Laterality: Right;   TOTAL KNEE ARTHROPLASTY Left 06/29/2016   Procedure: LEFT TOTAL KNEE ARTHROPLASTY;  Surgeon: Salvatore Marvel, MD;  Location: [facility] OR;  Service: Orthopedics;  Laterality: Left;   TOTAL KNEE ARTHROPLASTY Right 06/28/2017   Procedure: TOTAL KNEE ARTHROPLASTY;  Surgeon: Salvatore Marvel, MD;  Location: [facility] OR;  Service: Orthopedics;  Laterality: Right;    Prior to Admission medications   Medication Sig Start Date End Date Taking? Authorizing Provider  acetaminophen (TYLENOL) 500 MG tablet Take 500 mg by mouth every 6 (six) hours as needed for moderate pain.   Yes [provider]  ALPRAZolam Prudy Feeler) 1 MG tablet Take 1 mg by mouth See admin instructions. Five night a week for anxiety   Yes [provider]  amLODipine (NORVASC) 5 MG tablet Take 1 tablet by mouth daily. 04/07/19  Yes [provider]  b complex vitamins capsule Take 1 capsule by mouth every other day.   Yes [provider]  cholecalciferol (VITAMIN D3) 25 MCG (1000 UT) tablet Take 1,000 Units by mouth every other day.   Yes [provider]  DHEA 50 MG CAPS Take 50 mg by mouth daily.   Yes [provider]  Emollient (VASELINE INTENSIVE CARE EX) Apply 1 application  topically at bedtime.   Yes [provider]  famotidine (PEPCID AC) 10 MG chewable tablet Chew 10 mg by mouth at bedtime as needed for heartburn.    Yes [provider]  ibuprofen (ADVIL) 200 MG tablet Take 200 mg by mouth every 6 (six)  hours as needed (Pull muscle).   Yes [provider]  Multiple Vitamin (MULTIVITAMIN WITH MINERALS) TABS tablet Take 1 tablet by mouth daily. 50+   Yes [provider]  Omega-3 Fatty Acids (FISH OIL) 1200 MG CAPS Take 1,200 mg by mouth at bedtime.   Yes [provider]  OVER THE COUNTER MEDICATION Take 1 capsule by mouth at bedtime. Beet  + co q 10 with  grape seed   Yes [provider]  Polyethyl Glycol-Propyl Glycol  (SYSTANE) 0.4-0.3 % SOLN Place 1 drop into both eyes 3 (three) times daily.   Yes [provider]  Psyllium (METAMUCIL PO) Take 5 mLs by mouth at bedtime.   Yes [provider]  sodium chloride (OCEAN) 0.65 % SOLN nasal spray Place 1 spray into both nostrils at bedtime as needed for congestion (Sleep).   Yes [provider]  Wheat Dextrin (BENEFIBER DRINK MIX PO) Take 5 mLs by mouth in the morning.   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  Tobacco Use   Smoking status: Former    Current packs/day: 0.00    Average packs/day: 0.5 packs/day for 5.0 years (2.5 ttl pk-yrs)    Types: Cigarettes    Start date: 03/26/1970    Quit date: 03/27/1975    Years since quitting: 47.9   Smokeless tobacco: Former    Quit date: 08/03/1978  Vaping Use   Vaping status: Never Used  Substance and Sexual Activity   Alcohol use: Yes    Comment: beer daily 2-3   Drug use: No   Sexual activity: Yes    Birth control/protection: None  Other Topics Concern   Not on file  Social History Narrative   Not on file   Social Determinants of Health   Financial Resource Strain: Not on file  Food Insecurity: Not on file  Transportation Needs: Not on file  Physical Activity: Not on file  Stress: Not on file  Social Connections: Not on file  Intimate Partner Violence: Not on file    Review of Systems: See HPI, otherwise negative ROS  Physical Exam: BP 138/79 (BP Location: Right Arm, Patient Position: Sitting, Cuff Size: Normal)   Pulse 80    Temp 97.7 F (36.5 C) (Temporal)   Ht 6' (1.829 m)   Wt 196 lb 9.6 oz (89.2 kg)   SpO2 100%   BMI 26.66 kg/m  General:   Alert,  Well-developed, well-nourished, pleasant and cooperative in NAD Skin:  Intact without significant lesions or rashes. Eyes:  Sclera clear, no icterus.   Conjunctiva pink. Ears:  Normal auditory acuity. Nose:  No deformity, discharge,  or lesions. Mouth:  No deformity or lesions. Neck:  Supple; no masses or thyromegaly. No significant cervical adenopathy. Lungs:  Clear throughout to auscultation.   No wheezes, crackles, or rhonchi. No acute distress. Heart:  Regular rate and rhythm; no murmurs, clicks, rubs,  or  gallops. Abdomen: Non-distended, normal bowel sounds.  Soft and nontender without appreciable mass or hepatosplenomegaly.  Pulses:  Normal pulses noted. Extremities:  Without clubbing or edema.  Impression/Plan:  ***     Notice: This dictation was prepared with Dragon dictation along with smaller phrase technology. Any transcriptional errors that result from this process are unintentional and may not be corrected upon review.
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
## 2023-02-26 DIAGNOSIS — L97522 Non-pressure chronic ulcer of other part of left foot with fat layer exposed: Secondary | ICD-10-CM | POA: Diagnosis not present

## 2023-03-12 DIAGNOSIS — L97522 Non-pressure chronic ulcer of other part of left foot with fat layer exposed: Secondary | ICD-10-CM | POA: Diagnosis not present

## 2023-03-26 DIAGNOSIS — L97522 Non-pressure chronic ulcer of other part of left foot with fat layer exposed: Secondary | ICD-10-CM | POA: Diagnosis not present

## 2023-04-08 DIAGNOSIS — L97522 Non-pressure chronic ulcer of other part of left foot with fat layer exposed: Secondary | ICD-10-CM | POA: Diagnosis not present

## 2023-04-22 DIAGNOSIS — L97522 Non-pressure chronic ulcer of other part of left foot with fat layer exposed: Secondary | ICD-10-CM | POA: Diagnosis not present

## 2023-05-06 DIAGNOSIS — L97522 Non-pressure chronic ulcer of other part of left foot with fat layer exposed: Secondary | ICD-10-CM | POA: Diagnosis not present

## 2023-05-20 DIAGNOSIS — L97522 Non-pressure chronic ulcer of other part of left foot with fat layer exposed: Secondary | ICD-10-CM | POA: Diagnosis not present

## 2023-06-04 DIAGNOSIS — L97522 Non-pressure chronic ulcer of other part of left foot with fat layer exposed: Secondary | ICD-10-CM | POA: Diagnosis not present

## 2023-06-05 DIAGNOSIS — E663 Overweight: Secondary | ICD-10-CM | POA: Diagnosis not present

## 2023-06-05 DIAGNOSIS — Z6827 Body mass index (BMI) 27.0-27.9, adult: Secondary | ICD-10-CM | POA: Diagnosis not present

## 2023-06-05 DIAGNOSIS — R030 Elevated blood-pressure reading, without diagnosis of hypertension: Secondary | ICD-10-CM | POA: Diagnosis not present

## 2023-06-05 DIAGNOSIS — J069 Acute upper respiratory infection, unspecified: Secondary | ICD-10-CM | POA: Diagnosis not present

## 2023-06-18 DIAGNOSIS — M24572 Contracture, left ankle: Secondary | ICD-10-CM | POA: Diagnosis not present

## 2023-06-18 DIAGNOSIS — L97522 Non-pressure chronic ulcer of other part of left foot with fat layer exposed: Secondary | ICD-10-CM | POA: Diagnosis not present

## 2023-06-29 DIAGNOSIS — E785 Hyperlipidemia, unspecified: Secondary | ICD-10-CM | POA: Diagnosis not present

## 2023-06-29 DIAGNOSIS — I10 Essential (primary) hypertension: Secondary | ICD-10-CM | POA: Diagnosis not present

## 2023-06-29 DIAGNOSIS — Z125 Encounter for screening for malignant neoplasm of prostate: Secondary | ICD-10-CM | POA: Diagnosis not present

## 2023-07-05 DIAGNOSIS — R0981 Nasal congestion: Secondary | ICD-10-CM | POA: Diagnosis not present

## 2023-07-05 DIAGNOSIS — F411 Generalized anxiety disorder: Secondary | ICD-10-CM | POA: Diagnosis not present

## 2023-07-05 DIAGNOSIS — I10 Essential (primary) hypertension: Secondary | ICD-10-CM | POA: Diagnosis not present

## 2023-07-05 DIAGNOSIS — H6122 Impacted cerumen, left ear: Secondary | ICD-10-CM | POA: Diagnosis not present

## 2023-07-05 DIAGNOSIS — T148XXD Other injury of unspecified body region, subsequent encounter: Secondary | ICD-10-CM | POA: Diagnosis not present

## 2023-07-05 DIAGNOSIS — F5221 Male erectile disorder: Secondary | ICD-10-CM | POA: Diagnosis not present

## 2023-07-05 DIAGNOSIS — N529 Male erectile dysfunction, unspecified: Secondary | ICD-10-CM | POA: Diagnosis not present

## 2023-07-05 DIAGNOSIS — Z0000 Encounter for general adult medical examination without abnormal findings: Secondary | ICD-10-CM | POA: Diagnosis not present

## 2023-07-07 DIAGNOSIS — L97522 Non-pressure chronic ulcer of other part of left foot with fat layer exposed: Secondary | ICD-10-CM | POA: Diagnosis not present

## 2023-07-21 DIAGNOSIS — L97522 Non-pressure chronic ulcer of other part of left foot with fat layer exposed: Secondary | ICD-10-CM | POA: Diagnosis not present

## 2023-07-22 DIAGNOSIS — L570 Actinic keratosis: Secondary | ICD-10-CM | POA: Diagnosis not present

## 2023-07-22 DIAGNOSIS — D1801 Hemangioma of skin and subcutaneous tissue: Secondary | ICD-10-CM | POA: Diagnosis not present

## 2023-07-22 DIAGNOSIS — Z85828 Personal history of other malignant neoplasm of skin: Secondary | ICD-10-CM | POA: Diagnosis not present

## 2023-07-22 DIAGNOSIS — L821 Other seborrheic keratosis: Secondary | ICD-10-CM | POA: Diagnosis not present

## 2023-07-22 DIAGNOSIS — L820 Inflamed seborrheic keratosis: Secondary | ICD-10-CM | POA: Diagnosis not present

## 2023-08-02 DIAGNOSIS — L97522 Non-pressure chronic ulcer of other part of left foot with fat layer exposed: Secondary | ICD-10-CM | POA: Diagnosis not present

## 2023-08-09 DIAGNOSIS — H6122 Impacted cerumen, left ear: Secondary | ICD-10-CM | POA: Diagnosis not present

## 2023-08-16 DIAGNOSIS — L97522 Non-pressure chronic ulcer of other part of left foot with fat layer exposed: Secondary | ICD-10-CM | POA: Diagnosis not present

## 2023-08-30 DIAGNOSIS — L97522 Non-pressure chronic ulcer of other part of left foot with fat layer exposed: Secondary | ICD-10-CM | POA: Diagnosis not present

## 2023-09-13 DIAGNOSIS — L97522 Non-pressure chronic ulcer of other part of left foot with fat layer exposed: Secondary | ICD-10-CM | POA: Diagnosis not present

## 2023-09-27 DIAGNOSIS — L97522 Non-pressure chronic ulcer of other part of left foot with fat layer exposed: Secondary | ICD-10-CM | POA: Diagnosis not present

## 2023-10-11 DIAGNOSIS — M12272 Villonodular synovitis (pigmented), left ankle and foot: Secondary | ICD-10-CM | POA: Diagnosis not present

## 2023-10-19 DIAGNOSIS — H3589 Other specified retinal disorders: Secondary | ICD-10-CM | POA: Diagnosis not present

## 2023-11-15 DIAGNOSIS — M71572 Other bursitis, not elsewhere classified, left ankle and foot: Secondary | ICD-10-CM | POA: Diagnosis not present

## 2023-11-28 ENCOUNTER — Emergency Department (HOSPITAL_COMMUNITY)

## 2023-11-28 ENCOUNTER — Observation Stay (HOSPITAL_COMMUNITY)
Admission: EM | Admit: 2023-11-28 | Discharge: 2023-11-30 | Disposition: A | Discharge status: Home / Self Care | Attending: Internal Medicine | Admitting: Internal Medicine

## 2023-11-28 ENCOUNTER — Other Ambulatory Visit: Payer: Self-pay

## 2023-11-28 ENCOUNTER — Encounter (HOSPITAL_COMMUNITY): Payer: Self-pay | Admitting: Emergency Medicine

## 2023-11-28 DIAGNOSIS — I10 Essential (primary) hypertension: Secondary | ICD-10-CM | POA: Insufficient documentation

## 2023-11-28 DIAGNOSIS — Z79899 Other long term (current) drug therapy: Secondary | ICD-10-CM | POA: Insufficient documentation

## 2023-11-28 DIAGNOSIS — I722 Aneurysm of renal artery: Secondary | ICD-10-CM | POA: Diagnosis not present

## 2023-11-28 DIAGNOSIS — Z96653 Presence of artificial knee joint, bilateral: Secondary | ICD-10-CM | POA: Insufficient documentation

## 2023-11-28 DIAGNOSIS — I4891 Unspecified atrial fibrillation: Secondary | ICD-10-CM | POA: Diagnosis not present

## 2023-11-28 DIAGNOSIS — F419 Anxiety disorder, unspecified: Secondary | ICD-10-CM | POA: Insufficient documentation

## 2023-11-28 DIAGNOSIS — Z87891 Personal history of nicotine dependence: Secondary | ICD-10-CM | POA: Diagnosis not present

## 2023-11-28 DIAGNOSIS — K219 Gastro-esophageal reflux disease without esophagitis: Secondary | ICD-10-CM | POA: Insufficient documentation

## 2023-11-28 DIAGNOSIS — K56609 Unspecified intestinal obstruction, unspecified as to partial versus complete obstruction: Secondary | ICD-10-CM | POA: Diagnosis not present

## 2023-11-28 DIAGNOSIS — K567 Ileus, unspecified: Secondary | ICD-10-CM | POA: Insufficient documentation

## 2023-11-28 DIAGNOSIS — K5730 Diverticulosis of large intestine without perforation or abscess without bleeding: Secondary | ICD-10-CM | POA: Diagnosis not present

## 2023-11-28 DIAGNOSIS — N200 Calculus of kidney: Secondary | ICD-10-CM | POA: Diagnosis not present

## 2023-11-28 DIAGNOSIS — K529 Noninfective gastroenteritis and colitis, unspecified: Principal | ICD-10-CM | POA: Insufficient documentation

## 2023-11-28 DIAGNOSIS — R109 Unspecified abdominal pain: Secondary | ICD-10-CM | POA: Diagnosis present

## 2023-11-28 DIAGNOSIS — K4020 Bilateral inguinal hernia, without obstruction or gangrene, not specified as recurrent: Secondary | ICD-10-CM | POA: Diagnosis not present

## 2023-11-28 DIAGNOSIS — Z85828 Personal history of other malignant neoplasm of skin: Secondary | ICD-10-CM | POA: Diagnosis not present

## 2023-11-28 LAB — COMPREHENSIVE METABOLIC PANEL WITH GFR
ALT: 33 U/L (ref 0–44)
AST: 32 U/L (ref 15–41)
Albumin: 3.7 g/dL (ref 3.5–5.0)
Alkaline Phosphatase: 53 U/L (ref 38–126)
Anion gap: 10 (ref 5–15)
BUN: 17 mg/dL (ref 8–23)
CO2: 21 mmol/L — ABNORMAL LOW (ref 22–32)
Calcium: 8.9 mg/dL (ref 8.9–10.3)
Chloride: 106 mmol/L (ref 98–111)
Creatinine, Ser: 0.9 mg/dL (ref 0.61–1.24)
GFR, Estimated: >60 mL/min (ref >60)
Glucose, Bld: 124 mg/dL — ABNORMAL HIGH (ref 70–99)
Potassium: 3.7 mmol/L (ref 3.5–5.1)
Sodium: 137 mmol/L (ref 135–145)
Total Bilirubin: 0.8 mg/dL (ref 0.0–1.2)
Total Protein: 7 g/dL (ref 6.5–8.1)

## 2023-11-28 LAB — CBC
HCT: 45.3 % (ref 39.0–52.0)
Hemoglobin: 15.5 g/dL (ref 13.0–17.0)
MCH: 32.6 pg (ref 26.0–34.0)
MCHC: 34.2 g/dL (ref 30.0–36.0)
MCV: 95.4 fL (ref 80.0–100.0)
Platelets: 190 10*3/uL (ref 150–400)
RBC: 4.75 MIL/uL (ref 4.22–5.81)
RDW: 11.9 % (ref 11.5–15.5)
WBC: 12.5 10*3/uL — ABNORMAL HIGH (ref 4.0–10.5)
nRBC: 0 % (ref 0.0–0.2)

## 2023-11-28 LAB — URINALYSIS, ROUTINE W REFLEX MICROSCOPIC
Bilirubin Urine: NEGATIVE
Glucose, UA: NEGATIVE mg/dL
Hgb urine dipstick: NEGATIVE
Ketones, ur: NEGATIVE mg/dL
Leukocytes,Ua: NEGATIVE
Nitrite: NEGATIVE
Protein, ur: NEGATIVE mg/dL
Specific Gravity, Urine: 1.021 (ref 1.005–1.030)
pH: 5 (ref 5.0–8.0)

## 2023-11-28 LAB — TROPONIN I (HIGH SENSITIVITY): Troponin I (High Sensitivity): 4 ng/L (ref <18)

## 2023-11-28 LAB — LIPASE, BLOOD: Lipase: 27 U/L (ref 11–51)

## 2023-11-28 MED ORDER — ALUM & MAG HYDROXIDE-SIMETH 200-200-20 MG/5ML PO SUSP
30.0000 mL | Freq: Once | ORAL | Status: AC
  Administered 2023-11-28: 30 mL via ORAL
  Filled 2023-11-28: qty 30

## 2023-11-28 MED ORDER — METOPROLOL TARTRATE 25 MG PO TABS
12.5000 mg | ORAL_TABLET | Freq: Two times a day (BID) | ORAL | Status: DC
  Filled 2023-11-28: qty 1

## 2023-11-28 MED ORDER — SODIUM CHLORIDE 0.9 % IV BOLUS
500.0000 mL | Freq: Once | INTRAVENOUS | Status: AC
  Administered 2023-11-28: 500 mL via INTRAVENOUS

## 2023-11-28 MED ORDER — MELATONIN 3 MG PO TABS: 6.0000 mg | ORAL_TABLET | Freq: Every evening | ORAL | Status: DC | PRN

## 2023-11-28 MED ORDER — ACETAMINOPHEN 500 MG PO TABS: 1000.0000 mg | ORAL_TABLET | Freq: Four times a day (QID) | ORAL | Status: DC | PRN

## 2023-11-28 MED ORDER — HEPARIN BOLUS VIA INFUSION: 4500.0000 [IU] | Freq: Once | INTRAVENOUS | Status: DC

## 2023-11-28 MED ORDER — SODIUM CHLORIDE 0.9 % IV BOLUS
1000.0000 mL | Freq: Once | INTRAVENOUS | Status: AC
  Administered 2023-11-29: 1000 mL via INTRAVENOUS

## 2023-11-28 MED ORDER — ONDANSETRON HCL 4 MG/2ML IJ SOLN: 4.0000 mg | Freq: Four times a day (QID) | INTRAMUSCULAR | Status: DC | PRN

## 2023-11-28 MED ORDER — ALPRAZOLAM 1 MG PO TABS
1.0000 mg | ORAL_TABLET | Freq: Every evening | ORAL | Status: DC | PRN
  Administered 2023-11-29 (×2): 1 mg via ORAL
  Filled 2023-11-28 (×3): qty 1

## 2023-11-28 MED ORDER — HEPARIN (PORCINE) 25000 UT/250ML-% IV SOLN: 1200.0000 [IU]/h | INTRAVENOUS | Status: DC

## 2023-11-28 MED ORDER — POTASSIUM CHLORIDE 10 MEQ/100ML IV SOLN
10.0000 meq | INTRAVENOUS | Status: AC
  Administered 2023-11-29 (×4): 10 meq via INTRAVENOUS
  Filled 2023-11-28 (×4): qty 100

## 2023-11-28 MED ORDER — PANTOPRAZOLE SODIUM 40 MG PO TBEC
40.0000 mg | DELAYED_RELEASE_TABLET | Freq: Every day | ORAL | Status: DC
  Administered 2023-11-29 – 2023-11-30 (×2): 40 mg via ORAL
  Filled 2023-11-28 (×2): qty 1

## 2023-11-28 MED ORDER — SODIUM CHLORIDE 0.9 % IV SOLN: INTRAVENOUS | Status: AC

## 2023-11-28 MED ORDER — BISACODYL 10 MG RE SUPP: 10.0000 mg | Freq: Every day | RECTAL | Status: DC | PRN

## 2023-11-28 MED ORDER — IOHEXOL 300 MG/ML  SOLN
100.0000 mL | Freq: Once | INTRAMUSCULAR | Status: AC | PRN
  Administered 2023-11-28: 100 mL via INTRAVENOUS

## 2023-11-28 NOTE — ED Provider Notes (Signed)
 [place] EMERGENCY DEPARTMENT AT [facility] Provider Note   CSN: [id number] Arrival date & time: 11/28/23  1943     History  Chief Complaint  Patient presents with   Abdominal Pain    Spencer [name] is a 78 y.o. male.  Pt is a 78 yo male with pmhx significant for skin cancer, arthritis, and GERD.  Pt said he woke up this am with severe abd pain.  Pt said the pain comes and goes.  He went to church and could hardly make it through the service.  He said the pain is intermittent.  He denies n/v.  No cp.         Home Medications Prior to Admission medications   Medication Sig Start Date End Date Taking? Authorizing Provider  acetaminophen  (TYLENOL ) 500 MG tablet Take 500 mg by mouth every 6 (six) hours as needed for moderate pain.    [provider]  ALPRAZolam [place]) 1 MG tablet Take 1 mg by mouth See admin instructions. Five night a week for anxiety    [provider]  amLODipine (NORVASC) 5 MG tablet Take 1 tablet by mouth daily. 04/07/19   [provider]  b complex vitamins capsule Take 1 capsule by mouth every other day.    [provider]  cholecalciferol (VITAMIN D3) 25 MCG (1000 UT) tablet Take 1,000 Units by mouth every other day.    [provider]  DHEA 50 MG CAPS Take 50 mg by mouth daily.    [provider]  Emollient (VASELINE INTENSIVE CARE EX) Apply 1 application  topically at bedtime.    [provider]  famotidine  (PEPCID  AC) 10 MG chewable tablet Chew 10 mg by mouth at bedtime as needed for heartburn.     [provider]  ibuprofen (ADVIL) 200 MG tablet Take 200 mg by mouth every 6 (six) hours as needed (Pull muscle).    [provider]  Multiple Vitamin (MULTIVITAMIN WITH MINERALS) TABS tablet Take 1 tablet by mouth daily. 50+    [provider]  Omega-3 Fatty Acids (FISH OIL) 1200 MG CAPS Take 1,200 mg by mouth at bedtime.    [provider]  OVER THE  COUNTER MEDICATION Take 1 capsule by mouth at bedtime. Beet  + co q 10 with  grape seed    [provider]  Polyethyl Glycol-Propyl Glycol (SYSTANE) 0.4-0.3 % SOLN Place 1 drop into both eyes 3 (three) times daily.    [provider]  Psyllium (METAMUCIL PO) Take 5 mLs by mouth at bedtime.    [provider]  sodium chloride  (OCEAN) 0.65 % SOLN nasal spray Place 1 spray into both nostrils at bedtime as needed for congestion (Sleep).    [provider]  Wheat Dextrin (BENEFIBER DRINK MIX PO) Take 5 mLs by mouth in the morning.    [provider]      Allergies    Patient has no known allergies.    Review of Systems   Review of Systems  Gastrointestinal:  Positive for abdominal pain.  All other systems reviewed and are negative.   Physical Exam Updated Vital Signs BP (!) 154/98 (BP Location: Right Arm)   Pulse 99   Temp 97.9 F (36.6 C) (Oral)   Resp 18   Ht 6' (1.829 m)   Wt 83.9 kg   SpO2 96%   BMI 25.09 kg/m  Physical Exam Vitals and nursing note reviewed.  Constitutional:  Appearance: He is well-developed.  HENT:     Head: Normocephalic and atraumatic.     Mouth/Throat:     Mouth: Mucous membranes are moist.     Pharynx: Oropharynx is clear.  Eyes:     Extraocular Movements: Extraocular movements intact.     Pupils: Pupils are equal, round, and reactive to light.  Cardiovascular:     Rate and Rhythm: Tachycardia present. Rhythm irregular.  Pulmonary:     Effort: Pulmonary effort is normal.     Breath sounds: Normal breath sounds.  Abdominal:     General: Bowel sounds are decreased. There is distension.     Palpations: Abdomen is soft.     Tenderness: There is generalized abdominal tenderness.  Skin:    General: Skin is warm.     Capillary Refill: Capillary refill takes less than 2 seconds.  Neurological:     General: No focal deficit present.     Mental Status: He is alert and oriented to person, place, and time.   Psychiatric:        Mood and Affect: Mood normal.        Behavior: Behavior normal.     ED Results / Procedures / Treatments   Labs (all labs ordered are listed, but only abnormal results are displayed) Labs Reviewed  COMPREHENSIVE METABOLIC PANEL WITH GFR - Abnormal; Notable for the following components:      Result Value   CO2 21 (*)    Glucose, Bld 124 (*)    All other components within normal limits  CBC - Abnormal; Notable for the following components:   WBC 12.5 (*)    All other components within normal limits  LIPASE, BLOOD  URINALYSIS, ROUTINE W REFLEX MICROSCOPIC  TSH  MAGNESIUM   TROPONIN I (HIGH SENSITIVITY)  TROPONIN I (HIGH SENSITIVITY)    EKG EKG Interpretation Date/Time:  Sunday November 28 2023 22:55:52 EDT Ventricular Rate:  102 PR Interval:    QRS Duration:  95 QT Interval:  373 QTC Calculation: 486 R Axis:   -48  Text Interpretation: Atrial fibrillation LAD, consider left anterior fascicular block Low voltage, precordial leads Borderline prolonged QT interval No significant change since last tracing Confirmed by Sueellen Emery [phone]) on 11/28/2023 11:08:02 PM  Radiology CT ABDOMEN PELVIS W CONTRAST Result Date: 11/28/2023 CLINICAL DATA:  Abdominal pain which began this morning. EXAM: CT ABDOMEN AND PELVIS WITH CONTRAST TECHNIQUE: Multidetector CT imaging of the abdomen and pelvis was performed using the standard protocol following bolus administration of intravenous contrast. RADIATION DOSE REDUCTION: This exam was performed according to the departmental dose-optimization program which includes automated exposure control, adjustment of the mA and/or kV according to patient size and/or use of iterative reconstruction technique. CONTRAST:  100mL OMNIPAQUE IOHEXOL 300 MG/ML  SOLN COMPARISON:  None Available. FINDINGS: Lower chest: The lung bases are clear of acute process. No pleural effusion or pulmonary lesions. Right basilar scarring changes noted. The heart  is normal in size. No pericardial effusion. The distal esophagus and aorta are unremarkable. Hepatobiliary: No hepatic lesions or intrahepatic biliary dilatation. The gallbladder is contracted. No common bile duct dilatation. Pancreas: Unremarkable. No pancreatic ductal dilatation or surrounding inflammatory changes. Spleen: Normal in size without focal abnormality. Adrenals/Urinary Tract: The adrenal glands are normal. Simple left renal cysts not requiring any further imaging evaluation or follow-up. Small lower pole left renal calculus. 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 The bladder is normal. Stomach/Bowel: The stomach is distended with fluid  and there is also fluid in a small hiatal hernia suggesting reflux. The duodenum and proximal small bowel loops are unremarkable. The mid small bowel loops demonstrate mild dilatation and are fluid-filled with scattered air-fluid levels. There is a transition to normal/decompressed loops of mid distal ileal loops in the lower abdomen. Findings suggest an early or partial small bowel obstruction or enteritis. Recommend correlation with bowel sounds and follow-up radiographs as indicated. No free air. Small amount of free pelvic fluid. No significant colonic findings. Sigmoid colon diverticulosis. Vascular/Lymphatic: The aorta and branch vessels are patent. Scattered atherosclerotic calcifications. The major venous structures are patent. No mesenteric or retroperitoneal adenopathy. Reproductive: Mild prostate gland enlargement. The seminal vesicles are unremarkable. Other: Small amount of free pelvic fluid. Bilateral inguinal hernias containing. Musculoskeletal: No significant bony findings. IMPRESSION: 1. Findings suggest an early or partial small bowel obstruction or possibly enteritis. Recommend correlation with bowel sounds and follow-up radiographs as indicated. 2. Small amount of free pelvic fluid. No free air. 3. Small lower pole left  renal calculus. 4. 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 5. Bilateral inguinal hernias containing fat. Electronically Signed   By: Marrian Siva M.D.   On: 11/28/2023 22:12    Procedures Procedures    Medications Ordered in ED Medications  sodium chloride  0.9 % bolus 500 mL (500 mLs Intravenous New Bag/Given 11/28/23 2106)  alum & mag hydroxide-simeth (MAALOX/MYLANTA) 200-200-20 MG/5ML suspension 30 mL (30 mLs Oral Given 11/28/23 2106)  iohexol (OMNIPAQUE) 300 MG/ML solution 100 mL (100 mLs Intravenous Contrast Given 11/28/23 2136)    ED Course/ Medical Decision Making/ A&P                                 Medical Decision Making Amount and/or Complexity of Data Reviewed Labs: ordered. Radiology: ordered.  Risk OTC drugs. Prescription drug management.   This patient presents to the ED for concern of abd pain, this involves an extensive number of treatment options, and is a complaint that carries with it a high risk of complications and morbidity.  The differential diagnosis includes ileus, bowel obstruction, volvulus   Co morbidities that complicate the patient evaluation  skin cancer, arthritis, and GERD   Additional history obtained:  Additional history obtained from epic chart review External records from outside source obtained and reviewed including wife   Lab Tests:  I Ordered, and personally interpreted labs.  The pertinent results include:  ua nl, cbc nl, cmp nl, trop nl, lip nl   Imaging Studies ordered:  I ordered imaging studies including Ct abd/pelvis  I independently visualized and interpreted imaging which showed  . Findings suggest an early or partial small bowel obstruction or  possibly enteritis. Recommend correlation with bowel sounds and  follow-up radiographs as indicated.  2. Small amount of free pelvic fluid. No free air.  3. Small lower pole left renal calculus.  4. 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  5. Bilateral  inguinal hernias containing fat.   I agree with the radiologist interpretation   Cardiac Monitoring:  The patient was maintained on a cardiac monitor.  I personally viewed and interpreted the cardiac monitored which showed an underlying rhythm of: afib   Medicines ordered and prescription drug management:  I ordered medication including gi cocktail/ivfs  for sx  Reevaluation of the patient after these medicines showed that the patient improved I have reviewed the patients home medicines and  have made adjustments as needed   Test Considered:  ct   Consultations Obtained:  I requested consultation with Dr. Larrie Po (surgery),  and discussed lab and imaging findings as well as pertinent plan - he recommends hospitalist admission and he will see in the am.  Hold on NG unless vomiting. Pt d/w Dr. Segars ([facility]) who will admit   Problem List / ED Course:  Ileus vs sbo:  pt is not vomiting now, so I will hold on NG Afib:  new onset.  Pt unaware of afib and has no hx of afib.  I will hold on heparin as there's a possibility of surgery. CHA2DS2/VAS Stroke Risk Points      N/A >= 2 Points: High Risk  1 to 1.99 Points: Medium Risk  0 Points: Low Risk    Last Change: N/A      This score determines the patient's risk of having a stroke if the  patient has atrial fibrillation.      This score is not applicable to this patient. Components are not  calculated.       Reevaluation:  After the interventions noted above, I reevaluated the patient and found that they have :improved   Social Determinants of Health:  Lives at home   Dispostion:  After consideration of the diagnostic results and the patients response to treatment, I feel that the patent would benefit from admission.          Final Clinical Impression(s) / ED Diagnoses Final diagnoses:  Small bowel obstruction (HCC)  New onset atrial fibrillation [facility])    Rx / DC Orders ED Discharge Orders     None          Sueellen Emery, MD 11/28/23 2314

## 2023-11-28 NOTE — Progress Notes (Addendum)
 PHARMACY - ANTICOAGULATION CONSULT NOTE  Pharmacy Consult for heparin Indication: atrial fibrillation  No Known Allergies  Patient Measurements: Height: 6' (182.9 cm) Weight: 83.9 kg (185 lb) IBW/kg (Calculated) : 77.6 HEPARIN DW (KG): 83.9  Vital Signs: Temp: 97.9 F (36.6 C) (04/27 1956) Temp Source: Oral (04/27 1956) BP: 154/98 (04/27 1956) Pulse Rate: 99 (04/27 1956)  Labs: Recent Labs    11/28/23 2008  HGB 15.5  HCT 45.3  PLT 190  CREATININE 0.90  TROPONINIHS 4    Estimated Creatinine Clearance: 77.8 mL/min (by C-G formula based on SCr of 0.9 mg/dL).   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 Pharmacy consulted to dose heparin  -CBC stable -No PTA anticoagulation -CTa: partial small bowel obstruction  Goal of Therapy:  Heparin level 0.3-0.7 units/ml Monitor platelets by anticoagulation protocol: Yes   Plan:  Give 4500 units bolus x 1 Start heparin infusion at 1200 units/hr Check anti-Xa level in 8 hours and daily while on heparin Continue to monitor H&H and platelets  Young Hensen, PharmD. Clinical Pharmacist 11/28/2023 11:37 PM

## 2023-11-28 NOTE — ED Triage Notes (Signed)
 Pt with c/o intermittent abdominal pain that started at church today. Pt now feels bloated. Last BM this am. Denies N/V.

## 2023-11-29 ENCOUNTER — Inpatient Hospital Stay (HOSPITAL_COMMUNITY)

## 2023-11-29 ENCOUNTER — Other Ambulatory Visit (HOSPITAL_COMMUNITY): Payer: Self-pay | Admitting: *Deleted

## 2023-11-29 DIAGNOSIS — K529 Noninfective gastroenteritis and colitis, unspecified: Secondary | ICD-10-CM

## 2023-11-29 DIAGNOSIS — I4891 Unspecified atrial fibrillation: Secondary | ICD-10-CM

## 2023-11-29 DIAGNOSIS — K567 Ileus, unspecified: Secondary | ICD-10-CM

## 2023-11-29 DIAGNOSIS — I10 Essential (primary) hypertension: Secondary | ICD-10-CM

## 2023-11-29 LAB — MAGNESIUM
Magnesium: 1.9 mg/dL (ref 1.7–2.4)
Magnesium: 2 mg/dL (ref 1.7–2.4)

## 2023-11-29 LAB — CBC
HCT: 42 % (ref 39.0–52.0)
Hemoglobin: 14.5 g/dL (ref 13.0–17.0)
MCH: 33.1 pg (ref 26.0–34.0)
MCHC: 34.5 g/dL (ref 30.0–36.0)
MCV: 95.9 fL (ref 80.0–100.0)
Platelets: 177 10*3/uL (ref 150–400)
RBC: 4.38 MIL/uL (ref 4.22–5.81)
RDW: 12 % (ref 11.5–15.5)
WBC: 9.3 10*3/uL (ref 4.0–10.5)
nRBC: 0 % (ref 0.0–0.2)

## 2023-11-29 LAB — ECHOCARDIOGRAM COMPLETE
Area-P 1/2: 3.53 cm2
Height: 72 in
S' Lateral: 2.9 cm
Weight: 3224 [oz_av]

## 2023-11-29 LAB — BASIC METABOLIC PANEL WITH GFR
Anion gap: 9 (ref 5–15)
BUN: 13 mg/dL (ref 8–23)
CO2: 24 mmol/L (ref 22–32)
Calcium: 8.4 mg/dL — ABNORMAL LOW (ref 8.9–10.3)
Chloride: 105 mmol/L (ref 98–111)
Creatinine, Ser: 0.72 mg/dL (ref 0.61–1.24)
GFR, Estimated: >60 mL/min (ref >60)
Glucose, Bld: 93 mg/dL (ref 70–99)
Potassium: 3.8 mmol/L (ref 3.5–5.1)
Sodium: 138 mmol/L (ref 135–145)

## 2023-11-29 LAB — TSH
TSH: 2.871 u[IU]/mL (ref 0.350–4.500)
TSH: 5.434 u[IU]/mL — ABNORMAL HIGH (ref 0.350–4.500)

## 2023-11-29 LAB — PHOSPHORUS: Phosphorus: 2.9 mg/dL (ref 2.5–4.6)

## 2023-11-29 MED ORDER — ORAL CARE MOUTH RINSE: 15.0000 mL | OROMUCOSAL | Status: DC | PRN

## 2023-11-29 MED ORDER — BISACODYL 10 MG RE SUPP
10.0000 mg | Freq: Once | RECTAL | Status: AC
  Administered 2023-11-29: 10 mg via RECTAL
  Filled 2023-11-29: qty 1

## 2023-11-29 MED ORDER — DILTIAZEM HCL 30 MG PO TABS
30.0000 mg | ORAL_TABLET | Freq: Four times a day (QID) | ORAL | Status: DC
  Administered 2023-11-29 (×3): 30 mg via ORAL
  Filled 2023-11-29 (×3): qty 1

## 2023-11-29 MED ORDER — APIXABAN 5 MG PO TABS
5.0000 mg | ORAL_TABLET | Freq: Two times a day (BID) | ORAL | Status: DC
  Administered 2023-11-29 – 2023-11-30 (×2): 5 mg via ORAL
  Filled 2023-11-29 (×2): qty 1

## 2023-11-29 MED ORDER — METOPROLOL TARTRATE 25 MG PO TABS
25.0000 mg | ORAL_TABLET | Freq: Two times a day (BID) | ORAL | Status: DC
  Administered 2023-11-29 – 2023-11-30 (×2): 25 mg via ORAL
  Filled 2023-11-29 (×2): qty 1

## 2023-11-29 MED ORDER — ENOXAPARIN SODIUM 40 MG/0.4ML IJ SOSY: 40.0000 mg | PREFILLED_SYRINGE | INTRAMUSCULAR | Status: DC

## 2023-11-29 NOTE — Assessment & Plan Note (Signed)
 Blood pressure has been controlled.  Continue blood pressure control with metoprolol Hold on amlodipine.

## 2023-11-29 NOTE — Progress Notes (Signed)
   11/29/23 1057  TOC Brief Assessment  Insurance and Status Reviewed  Patient has primary care physician Yes  Home environment has been reviewed from home  Prior level of function: independent  Prior/Current Home Services No current home services  Social Drivers of Health Review SDOH reviewed no interventions necessary  Readmission risk has been reviewed Yes  Transition of care needs no transition of care needs at this time     Transition of Care Department [facility]) has reviewed patient and no TOC needs have been identified at this time. We will continue to monitor patient advancement through interdisciplinary progression rounds. If new patient transition needs arise, please place a TOC consult.

## 2023-11-29 NOTE — Plan of Care (Signed)
   Problem: Education: Goal: Knowledge of General Education information will improve Description Including pain rating scale, medication(s)/side effects and non-pharmacologic comfort measures Outcome: Progressing   Problem: Education: Goal: Knowledge of General Education information will improve Description Including pain rating scale, medication(s)/side effects and non-pharmacologic comfort measures Outcome: Progressing

## 2023-11-29 NOTE — Progress Notes (Addendum)
  Progress Note   Patient: Spencer [name]:[id number] DOB: 10-21-1945 DOA: 11/28/2023     1 DOS: the patient was seen and examined on 11/29/2023   Brief hospital course: Spencer [name] was admitted to the hospital with the working diagnosis of enteritis.   78 yo male with the past medical history of hypertension, GERD, GI bleed and anxiety who presented with abdominal pain. After a large meal he developed colicky abdominal pain, epigastric in location and severe in intensity. On his initial physical examination his blood pressure was 149/105, HR 90, RR 16 and 02 saturation 100% Lungs with no wheezing or rhonchi, heart with S1 and S2 present, irregular with no gallops or murmurs, abdomen distended with hypoactive bowel sounds with no tenderness or guarding, no lower extremity edema.   CT abdomen and pelvis with findings suggesting an early or partial small bowel obstruction or possible enteritis. Small amount of free pelvic fluids.  Small lower pole left renal calculus.  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   EKG 103 bpm, left axis, deviation, qtc 477, atrial fibrillation rhythm with PVC, with no significant ST segment or T wave changes.    Assessment and Plan: Enteritis Clinically improved,. Today no signs of small bowel obstruction.  Plan to continue supportive medical therapy Diet has been advanced per surgery recommendations    Essential hypertension Blood pressure has been controlled.  At home patient on amlodipine, currently has been place on metoprolol for atrial fibrillation.   New onset a-fib [facility]) Echocardiogram with preserved LV systolic function with EF 60 to 65%, RV systolic function preserved, LA and RA with mild dilatation, no significant valvular disease.   Plan to transition from diltiazem to metoprolol for rate control.  His Cha2ds2vasc  score is 3, risk vs benefit will start patient on anticoagulation with apixaban.  Continue telemetry monitoring.    EGD on 04.24 with Barrett's esophagus with erosive reflux esophagitis. Normal duodenal bulb and second portion of the duodenum.       Subjective: Patient with improvement in abdominal pain, no nausea or vomiting, positive flatus   Physical Exam: Vitals:   11/29/23 0500 11/29/23 0817 11/29/23 1239 11/29/23 1500  BP: (!) 142/92 136/78 128/79 119/82  Pulse: 92 75 98 70  Resp: 19 18  16   Temp: 97.7 F (36.5 C) 97.8 F (36.6 C) 97.8 F (36.6 C) 98.1 F (36.7 C)  TempSrc: Oral Oral Oral Oral  SpO2: 98% 96%    Weight:      Height:       Neurology awake and alert ENT with mild pallor Cardiovascular with S1 and S2 present, irregularly irregular with no gallops rubs or murmurs Respiratory with no rales or wheezing, no rhonchi  Abdomen with soft and non distended non tender No lower extremity edema  Data Reviewed:    Family Communication: no family at the bedside   Disposition: Status is: Observation The patient remains OBS appropriate and will d/c before 2 midnights.  Planned Discharge Destination: Home     Author: Albertus Alt, MD 11/29/2023 5:05 PM  For on call review [email or web address].

## 2023-11-29 NOTE — Progress Notes (Signed)
 Nurse at bedside,patient alert and oriented times four. No c/o pain or discomfort noted.Ambulates independently in room without difficulty.

## 2023-11-29 NOTE — ED Notes (Signed)
 ED TO INPATIENT HANDOFF REPORT  ED Nurse Name and Phone #:   S Name/Age/Gender Spencer [name] 78 y.o. male Room/Bed: APFT20/APFT20  Code Status   Code Status: Full Code  Home/SNF/Other Home Patient oriented to: self, place, time, and situation Is this baseline? Yes   Triage Complete: Triage complete  Chief Complaint Ileus [facility]) [K56.7]  Triage Note Pt with c/o intermittent abdominal pain that started at church today. Pt now feels bloated. Last BM this am. Denies N/V.    Allergies No Known Allergies  Level of Care/Admitting Diagnosis ED Disposition     ED Disposition  Admit   Condition  --   Comment  Hospital Area: [facility] [[id number]]  Level of Care: Telemetry [5]  Covid Evaluation: Asymptomatic - no recent exposure (last 10 days) testing not required  Diagnosis: Ileus [facility]) [[id number]]  Admitting Physician: SEGARS, JONATHAN [[id number]]  Attending Physician: SEGARS, JONATHAN [[id number]]  Certification:: I certify this patient will need inpatient services for at least 2 midnights  Expected Medical Readiness: 11/30/2023          B Medical/Surgery History 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   Fracture    ribs/ right wrist x3/ left ankle/cracked vertebrae-cervical/   GERD (gastroesophageal reflux disease)    Primary localized osteoarthritis of left knee    Primary localized osteoarthritis of right knee    Past Surgical History:  Procedure Laterality Date   BASAL CELL CARCINOMA EXCISION     BIOPSY  11/02/2022   Procedure: BIOPSY;  Surgeon: Suzette Espy, MD;  [facility];  Service: Endoscopy;;   COLONOSCOPY  07/04/2004   RMR: Diminutive rectal polyps, removed with cold biopsy forceps, otherwise normal/  Minimal left-sided diverticula, the remainder of the colonic mucosa normal. Hyerplastic   COLONOSCOPY N/A 03/27/2013   Dr.Rourk-prominent internal hemorrhoids o/w normal rectum.  scattered sigmoid diverticula with associated areas of submucosal petechiae. two 2mm diminutive polypoid lesions opposite the ileocecal valve the remainder of the colonic mucosa appeared normal. bx= tubular adenoma, benign hyperemic colorectal mucosa with focal active colitis   COLONOSCOPY N/A 05/26/2017   Procedure: COLONOSCOPY;  Surgeon: Suzette Espy, MD;  [facility];  Service: Endoscopy;  Laterality: N/A;  7:30am   ESOPHAGOGASTRODUODENOSCOPY (EGD) WITH PROPOFOL  N/A 11/02/2022   Procedure: ESOPHAGOGASTRODUODENOSCOPY (EGD) WITH PROPOFOL ;  Surgeon: Suzette Espy, MD;  [facility];  Service: Endoscopy;  Laterality: N/A;  9:45 am   FOOT SURGERY Left    X 2-otif   FOOT SURGERY Right 2003   HEMORRHOID BANDING     KNEE SURGERY Left 1973   open   OLECRANON BURSECTOMY Right 03/29/2014   Procedure: EXCISION OF RIGHT OLECRANON BURSA;  Surgeon: Pleasant Brilliant, MD;  Location: [facility];  Service: Orthopedics;  Laterality: Right;   TOTAL KNEE ARTHROPLASTY Left 06/29/2016   Procedure: LEFT TOTAL KNEE ARTHROPLASTY;  Surgeon: Elly Habermann, MD;  Location: [facility] OR;  Service: Orthopedics;  Laterality: Left;   TOTAL KNEE ARTHROPLASTY Right 06/28/2017   Procedure: TOTAL KNEE ARTHROPLASTY;  Surgeon: Elly Habermann, MD;  Location: [facility] OR;  Service: Orthopedics;  Laterality: Right;     A IV Location/Drains/Wounds Patient Lines/Drains/Airways Status     Active Line/Drains/Airways     Name Placement date Placement time Site Days   Peripheral IV 11/28/23 20 G Left Antecubital 11/28/23  2102  Antecubital  1  Intake/Output Last 24 hours No intake or output data in the 24 hours ending 11/29/23 0026  Labs/Imaging Results for orders placed or performed during the hospital encounter of 11/28/23 (from the past 48 hours)  Urinalysis, Routine w reflex microscopic -Urine, Clean Catch     Status: None   Collection Time: 11/28/23  7:59 PM  Result Value Ref Range   Color, Urine  YELLOW YELLOW   APPearance CLEAR CLEAR   Specific Gravity, Urine 1.021 1.005 - 1.030   pH 5.0 5.0 - 8.0   Glucose, UA NEGATIVE NEGATIVE mg/dL   Hgb urine dipstick NEGATIVE NEGATIVE   Bilirubin Urine NEGATIVE NEGATIVE   Ketones, ur NEGATIVE NEGATIVE mg/dL   Protein, ur NEGATIVE NEGATIVE mg/dL   Nitrite NEGATIVE NEGATIVE   Leukocytes,Ua NEGATIVE NEGATIVE    Comment: Performed at [facility], [address], [place], [place] [address]  Lipase, blood     Status: None   Collection Time: 11/28/23  8:08 PM  Result Value Ref Range   Lipase 27 11 - 51 U/L    Comment: Performed at [facility], [address] [place], [place] [address]  Comprehensive metabolic panel     Status: Abnormal   Collection Time: 11/28/23  8:08 PM  Result Value Ref Range   Sodium 137 135 - 145 mmol/L   Potassium 3.7 3.5 - 5.1 mmol/L   Chloride 106 98 - 111 mmol/L   CO2 21 (L) 22 - 32 mmol/L   Glucose, Bld 124 (H) 70 - 99 mg/dL    Comment: Glucose reference range applies only to samples taken after fasting for at least 8 hours.   BUN 17 8 - 23 mg/dL   Creatinine, Ser 0.98 0.61 - 1.24 mg/dL   Calcium 8.9 8.9 - 11.9 mg/dL   Total Protein 7.0 6.5 - 8.1 g/dL   Albumin 3.7 3.5 - 5.0 g/dL   AST 32 15 - 41 U/L   ALT 33 0 - 44 U/L   Alkaline Phosphatase 53 38 - 126 U/L   Total Bilirubin 0.8 0.0 - 1.2 mg/dL   GFR, Estimated >14 >78 mL/min    Comment: (NOTE) Calculated using the CKD-EPI Creatinine Equation (2021)    Anion gap 10 5 - 15    Comment: Performed at [facility], [address] [place], [place] [address]  CBC     Status: Abnormal   Collection Time: 11/28/23  8:08 PM  Result Value Ref Range   WBC 12.5 (H) 4.0 - 10.5 K/uL   RBC 4.75 4.22 - 5.81 MIL/uL   Hemoglobin 15.5 13.0 - 17.0 g/dL   HCT 13.0 86.5 - 78.4 %   MCV 95.4 80.0 - 100.0 fL   MCH 32.6 26.0 - 34.0 pg   MCHC 34.2 30.0 - 36.0 g/dL   RDW 69.6 29.5 - 28.4 %   Platelets 190 150 - 400 K/uL   nRBC 0.0 0.0 - 0.2 %    Comment: Performed at [facility], [address], [place], [place] [address]  Troponin I (High Sensitivity)     Status: None   Collection Time: 11/28/23  8:08 PM  Result Value Ref Range   Troponin I (High Sensitivity) 4 <18 ng/L    Comment: (NOTE) Elevated high sensitivity troponin I (hsTnI) values and significant  changes across serial measurements may suggest ACS but many other  chronic and acute conditions are known to elevate hsTnI results.  Refer to the "Links" section for chest pain algorithms and additional  guidance.  Performed at [facility], [address], [place], [place] [address]    CT ABDOMEN PELVIS W CONTRAST Result Date: 11/28/2023 CLINICAL DATA:  Abdominal pain which began this morning. EXAM: CT ABDOMEN AND PELVIS WITH CONTRAST TECHNIQUE: Multidetector CT imaging of the abdomen and pelvis was performed using the standard protocol following bolus administration of intravenous contrast. RADIATION DOSE REDUCTION: This exam was performed according to the departmental dose-optimization program which includes automated exposure control, adjustment of the mA and/or kV according to patient size and/or use of iterative reconstruction technique. CONTRAST:  100mL OMNIPAQUE IOHEXOL 300 MG/ML  SOLN COMPARISON:  None Available. FINDINGS: Lower chest: The lung bases are clear of acute process. No pleural effusion or pulmonary lesions. Right basilar scarring changes noted. The heart is normal in size. No pericardial effusion. The distal esophagus and aorta are unremarkable. Hepatobiliary: No hepatic lesions or intrahepatic biliary dilatation. The gallbladder is contracted. No common bile duct dilatation. Pancreas: Unremarkable. No pancreatic ductal dilatation or surrounding inflammatory changes. Spleen: Normal in size without focal abnormality. Adrenals/Urinary Tract: The adrenal glands are normal. Simple left renal cysts not requiring any further imaging evaluation or follow-up. Small lower pole left renal calculus. 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 The bladder is normal. Stomach/Bowel: The stomach is distended with fluid and there is also fluid in a small hiatal hernia suggesting reflux. The duodenum and proximal small bowel loops are unremarkable. The mid small bowel loops demonstrate mild dilatation and are fluid-filled with scattered air-fluid levels. There is a transition to normal/decompressed loops of mid distal ileal loops in the lower abdomen. Findings suggest an early or partial small bowel obstruction or enteritis. Recommend correlation with bowel sounds and follow-up radiographs as indicated. No free air. Small amount of free pelvic fluid. No significant colonic findings. Sigmoid colon diverticulosis. Vascular/Lymphatic: The aorta and branch vessels are patent. Scattered atherosclerotic calcifications. The major venous structures are patent. No mesenteric or retroperitoneal adenopathy. Reproductive: Mild prostate gland enlargement. The seminal vesicles are unremarkable. Other: Small amount of free pelvic fluid. Bilateral inguinal hernias containing. Musculoskeletal: No significant bony findings. IMPRESSION: 1. Findings suggest an early or partial small bowel obstruction or possibly enteritis. Recommend correlation with bowel sounds and follow-up radiographs as indicated. 2. Small amount of free pelvic fluid. No free air. 3. Small lower pole left renal calculus. 4. 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 5. Bilateral inguinal hernias containing fat. Electronically Signed   By: Marrian Siva M.D.   On: 11/28/2023 22:12    Pending Labs Unresulted Labs (From admission, onward)     Start     Ordered   11/29/23 0500  Basic metabolic panel with GFR  Tomorrow morning,   R        11/28/23 2310   11/29/23 0500  CBC  Tomorrow morning,   R        11/28/23 2310   11/29/23 0500  Magnesium   Tomorrow morning,   R        11/28/23 2310   11/29/23 0500  Phosphorus  Tomorrow morning,   R         11/28/23 2310   11/29/23 0500  TSH  Tomorrow morning,   R        11/28/23 2310   11/28/23 2307  TSH  Once,   R        11/28/23 2306   11/28/23 2307  Magnesium   Once,   R  11/28/23 2306            Vitals/Pain Today's Vitals   11/28/23 1956 11/28/23 1957 11/29/23 0000 11/29/23 0015  BP: (!) 154/98  (!) 149/105 (!) 149/105  Pulse: 99  97   Resp: 18  14   Temp: 97.9 F (36.6 C)  98.1 F (36.7 C)   TempSrc: Oral     SpO2: 96%  96%   Weight:  83.9 kg    Height:  6' (1.829 m)    PainSc: 2        Isolation Precautions No active isolations  Medications Medications  0.9 %  sodium chloride  infusion (has no administration in time range)  potassium chloride  10 mEq in 100 mL IVPB (10 mEq Intravenous New Bag/Given 11/29/23 0009)  acetaminophen  (TYLENOL ) tablet 1,000 mg (has no administration in time range)  melatonin tablet 6 mg (has no administration in time range)  ondansetron  (ZOFRAN ) injection 4 mg (has no administration in time range)  bisacodyl (DULCOLAX) suppository 10 mg (has no administration in time range)  ALPRAZolam (XANAX) tablet 1 mg (has no administration in time range)  pantoprazole  (PROTONIX ) EC tablet 40 mg (has no administration in time range)  diltiazem (CARDIZEM) tablet 30 mg (30 mg Oral Given 11/29/23 0015)  bisacodyl (DULCOLAX) suppository 10 mg (has no administration in time range)  sodium chloride  0.9 % bolus 500 mL (0 mLs Intravenous Stopped 11/29/23 0004)  alum & mag hydroxide-simeth (MAALOX/MYLANTA) 200-200-20 MG/5ML suspension 30 mL (30 mLs Oral Given 11/28/23 2106)  iohexol (OMNIPAQUE) 300 MG/ML solution 100 mL (100 mLs Intravenous Contrast Given 11/28/23 2136)  sodium chloride  0.9 % bolus 1,000 mL (1,000 mLs Intravenous New Bag/Given 11/29/23 0006)    Mobility walks     Focused Assessments    R Recommendations: See Admitting Provider Note  Report given to:   Additional Notes:

## 2023-11-29 NOTE — Hospital Course (Addendum)
 Mr. Stoup was admitted to the hospital with the working diagnosis of enteritis.   78 yo male with the past medical history of hypertension, GERD, remote history of GI bleed and anxiety who presented with abdominal pain. After a large meal he developed colicky abdominal pain, epigastric in location and severe in intensity. On his initial physical examination his blood pressure was 149/105, HR 90, RR 16 and 02 saturation 100% Lungs with no wheezing or rhonchi, heart with S1 and S2 present, irregular with no gallops or murmurs, abdomen distended with hypoactive bowel sounds with no tenderness or guarding, no lower extremity edema.   Na 137, K 3,7 Cl 106 bicarbonate 21, glucose 124, bun 17 cr 0,90 Mg 1.9  AST 32 ALT 33  Wbc 12.5 hgb 15,5 plt 190  TSH 2,87  Urine analysis SG 1,021, negative protein, negative leukocytes and negative hgb   CT abdomen and pelvis with findings suggesting an early or partial small bowel obstruction or possible enteritis. Small amount of free pelvic fluids.  Small lower pole left renal calculus.  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   EKG 103 bpm, left axis, deviation, qtc 477, atrial fibrillation rhythm with PVC, with no significant ST segment or T wave changes.   Patient was placed on supportive conservative treatment with improvement in his symptoms.  Plan to follow up as outpatient.

## 2023-11-29 NOTE — Consult Note (Signed)
 [facility] Consult  Reason for Consult: Ileus/ Enteritis  Referring Physician: Dr. Sunnie England, MD   Chief Complaint   Abdominal Pain     HPI: Spencer [name] is a 78 y.o. male with GERD, HTN who presented with acute onset of abdominal pain yesterday at around 10AM. Saturday night he says he ate a very very large [place] meal and was stuffed after that meal. He had Bms on Sunday but was distended and came into the hospital. He has never had any abdominal surgery. He was scanned and the CT questioned enteritis versus ileus. He says he had another BM today at the hospital. He feels less bloated. No nausea/vomiting.   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   Fracture    ribs/ right wrist x3/ left ankle/cracked vertebrae-cervical/   GERD (gastroesophageal reflux disease)    Primary localized osteoarthritis of left knee    Primary localized osteoarthritis of right knee     Past Surgical History:  Procedure Laterality Date   BASAL CELL CARCINOMA EXCISION     BIOPSY  11/02/2022   Procedure: BIOPSY;  Surgeon: Suzette Espy, MD;  Location: [facility];  Service: Endoscopy;;   COLONOSCOPY  07/04/2004   RMR: Diminutive rectal polyps, removed with cold biopsy forceps, otherwise normal/  Minimal left-sided diverticula, the remainder of the colonic mucosa normal. Hyerplastic   COLONOSCOPY N/A 03/27/2013   Dr.Rourk-prominent internal hemorrhoids o/w normal rectum. scattered sigmoid diverticula with associated areas of submucosal petechiae. two 2mm diminutive polypoid lesions opposite the ileocecal valve the remainder of the colonic mucosa appeared normal. bx= tubular adenoma, benign hyperemic colorectal mucosa with focal active colitis   COLONOSCOPY N/A 05/26/2017   Procedure: COLONOSCOPY;  Surgeon: Suzette Espy, MD;  [facility]: [facility];  Service: Endoscopy;  Laterality: N/A;  7:30am   ESOPHAGOGASTRODUODENOSCOPY  (EGD) WITH PROPOFOL  N/A 11/02/2022   Procedure: ESOPHAGOGASTRODUODENOSCOPY (EGD) WITH PROPOFOL ;  Surgeon: Suzette Espy, MD;  [facility]: [facility];  Service: Endoscopy;  Laterality: N/A;  9:45 am   FOOT SURGERY Left    X 2-otif   FOOT SURGERY Right 2003   HEMORRHOID BANDING     KNEE SURGERY Left 1973   open   OLECRANON BURSECTOMY Right 03/29/2014   Procedure: EXCISION OF RIGHT OLECRANON BURSA;  Surgeon: Pleasant Brilliant, MD;  Location: [facility];  Service: Orthopedics;  Laterality: Right;   TOTAL KNEE ARTHROPLASTY Left 06/29/2016   Procedure: LEFT TOTAL KNEE ARTHROPLASTY;  Surgeon: Elly Habermann, MD;  Location: [facility] OR;  Service: Orthopedics;  Laterality: Left;   TOTAL KNEE ARTHROPLASTY Right 06/28/2017   Procedure: TOTAL KNEE ARTHROPLASTY;  Surgeon: Elly Habermann, MD;  Location: [facility] OR;  Service: Orthopedics;  Laterality: Right;    Family History  Problem Relation Age of Onset   Heart failure Mother    COPD Sister    Colon cancer Neg Hx     Social History   Tobacco Use   Smoking status: Former    Current packs/day: 0.00    Average packs/day: 0.5 packs/day for 5.0 years (2.5 ttl pk-yrs)    Types: Cigarettes    Start date: 03/26/1970    Quit date: 03/27/1975    Years since quitting: 48.7   Smokeless tobacco: Former    Quit date: 08/03/1978  Vaping Use   Vaping status: Never Used  Substance Use Topics   Alcohol use: Yes    Comment: beer daily 2-3  Drug use: No    Medications: I have reviewed the patient's current medications. Prior to Admission:  Medications Prior to Admission  Medication Sig Dispense Refill Last Dose/Taking   acetaminophen  (TYLENOL ) 500 MG tablet Take 500 mg by mouth every 6 (six) hours as needed for moderate pain.      ALPRAZolam (XANAX) 1 MG tablet Take 1 mg by mouth See admin instructions. Five night a week for anxiety      amLODipine (NORVASC) 5 MG tablet Take 1 tablet by mouth daily.      b complex vitamins capsule Take 1 capsule by mouth every other day.       cholecalciferol (VITAMIN D3) 25 MCG (1000 UT) tablet Take 1,000 Units by mouth every other day.      DHEA 50 MG CAPS Take 50 mg by mouth daily.      Emollient (VASELINE INTENSIVE CARE EX) Apply 1 application  topically at bedtime.      famotidine  (PEPCID  AC) 10 MG chewable tablet Chew 10 mg by mouth at bedtime as needed for heartburn.       ibuprofen (ADVIL) 200 MG tablet Take 200 mg by mouth every 6 (six) hours as needed (Pull muscle).      Multiple Vitamin (MULTIVITAMIN WITH MINERALS) TABS tablet Take 1 tablet by mouth daily. 50+      Omega-3 Fatty Acids (FISH OIL) 1200 MG CAPS Take 1,200 mg by mouth at bedtime.      OVER THE COUNTER MEDICATION Take 1 capsule by mouth at bedtime. Beet  + co q 10 with  grape seed      Polyethyl Glycol-Propyl Glycol (SYSTANE) 0.4-0.3 % SOLN Place 1 drop into both eyes 3 (three) times daily.      Psyllium (METAMUCIL PO) Take 5 mLs by mouth at bedtime.      sodium chloride  (OCEAN) 0.65 % SOLN nasal spray Place 1 spray into both nostrils at bedtime as needed for congestion (Sleep).      Wheat Dextrin (BENEFIBER DRINK MIX PO) Take 5 mLs by mouth in the morning.      Scheduled:  diltiazem  30 mg Oral Q6H   pantoprazole   40 mg Oral Daily   Continuous:  sodium chloride  75 mL/hr at 11/29/23 4098   PRN:acetaminophen , ALPRAZolam, bisacodyl, melatonin, ondansetron  (ZOFRAN ) IV, mouth rinse  No Known Allergies   ROS:  A comprehensive review of systems was negative except for: Gastrointestinal: positive for abdominal pain and distention  Blood pressure 136/78, pulse 75, temperature 97.8 F (36.6 C), temperature source Oral, resp. rate 18, height 6' (1.829 m), weight 91.4 kg, SpO2 96%. Physical Exam Vitals reviewed.  HENT:     Head: Normocephalic.  Eyes:     Extraocular Movements: Extraocular movements intact.  Cardiovascular:     Rate and Rhythm: Normal rate.  Pulmonary:     Effort: Pulmonary effort is normal.  Abdominal:     General: There is  distension.     Tenderness: There is no abdominal tenderness. There is no guarding or rebound.  Musculoskeletal:     Comments: Moves all extremities   Skin:    General: Skin is warm.  Neurological:     General: No focal deficit present.     Mental Status: He is alert and oriented to person, place, and time.  Psychiatric:        Mood and Affect: Mood normal.        Behavior: Behavior normal.     Results: Results for orders placed  or performed during the hospital encounter of 11/28/23 (from the past 48 hours)  Urinalysis, Routine w reflex microscopic -Urine, Clean Catch     Status: None   Collection Time: 11/28/23  7:59 PM  Result Value Ref Range   Color, Urine YELLOW YELLOW   APPearance CLEAR CLEAR   Specific Gravity, Urine 1.021 1.005 - 1.030   pH 5.0 5.0 - 8.0   Glucose, UA NEGATIVE NEGATIVE mg/dL   Hgb urine dipstick NEGATIVE NEGATIVE   Bilirubin Urine NEGATIVE NEGATIVE   Ketones, ur NEGATIVE NEGATIVE mg/dL   Protein, ur NEGATIVE NEGATIVE mg/dL   Nitrite NEGATIVE NEGATIVE   Leukocytes,Ua NEGATIVE NEGATIVE    Comment: Performed at [facility], [address], [place], [place] [address]  Lipase, blood     Status: None   Collection Time: 11/28/23  8:08 PM  Result Value Ref Range   Lipase 27 11 - 51 U/L    Comment: Performed at [facility], [address], [place], [place] [address]  Comprehensive metabolic panel     Status: Abnormal   Collection Time: 11/28/23  8:08 PM  Result Value Ref Range   Sodium 137 135 - 145 mmol/L   Potassium 3.7 3.5 - 5.1 mmol/L   Chloride 106 98 - 111 mmol/L   CO2 21 (L) 22 - 32 mmol/L   Glucose, Bld 124 (H) 70 - 99 mg/dL    Comment: Glucose reference range applies only to samples taken after fasting for at least 8 hours.   BUN 17 8 - 23 mg/dL   Creatinine, Ser 0.98 0.61 - 1.24 mg/dL   Calcium 8.9 8.9 - 11.9 mg/dL   Total Protein 7.0 6.5 - 8.1 g/dL   Albumin 3.7 3.5 - 5.0 g/dL   AST 32 15 - 41 U/L   ALT 33 0 - 44 U/L   Alkaline  Phosphatase 53 38 - 126 U/L   Total Bilirubin 0.8 0.0 - 1.2 mg/dL   GFR, Estimated >14 >78 mL/min    Comment: (NOTE) Calculated using the CKD-EPI Creatinine Equation (2021)    Anion gap 10 5 - 15    Comment: Performed at [facility], [address] [place], [place] [address]  CBC     Status: Abnormal   Collection Time: 11/28/23  8:08 PM  Result Value Ref Range   WBC 12.5 (H) 4.0 - 10.5 K/uL   RBC 4.75 4.22 - 5.81 MIL/uL   Hemoglobin 15.5 13.0 - 17.0 g/dL   HCT 13.0 86.5 - 78.4 %   MCV 95.4 80.0 - 100.0 fL   MCH 32.6 26.0 - 34.0 pg   MCHC 34.2 30.0 - 36.0 g/dL   RDW 69.6 29.5 - 28.4 %   Platelets 190 150 - 400 K/uL   nRBC 0.0 0.0 - 0.2 %    Comment: Performed at [facility], [address] [place], [place] [address]  Troponin I (High Sensitivity)     Status: None   Collection Time: 11/28/23  8:08 PM  Result Value Ref Range   Troponin I (High Sensitivity) 4 <18 ng/L    Comment: (NOTE) Elevated high sensitivity troponin I (hsTnI) values and significant  changes across serial measurements may suggest ACS but many other  chronic and acute conditions are known to elevate hsTnI results.  Refer to the "Links" section for chest pain algorithms and additional  guidance. Performed at [facility], [address] [place], [place] [address]   TSH     Status: None   Collection  Time: 11/28/23  8:08 PM  Result Value Ref Range   TSH 2.871 0.350 - 4.500 uIU/mL    Comment: Performed by a 3rd Generation assay with a functional sensitivity of <=0.01 uIU/mL. Performed at [facility], [address] [place], [place] [address]   Magnesium      Status: None   Collection Time: 11/28/23  8:08 PM  Result Value Ref Range   Magnesium  1.9 1.7 - 2.4 mg/dL    Comment: Performed at [facility], [address] [place], [place] [address]  Basic metabolic panel with GFR     Status: Abnormal   Collection Time: 11/29/23  4:04 AM  Result Value Ref Range   Sodium 138 135 - 145 mmol/L   Potassium 3.8 3.5  - 5.1 mmol/L   Chloride 105 98 - 111 mmol/L   CO2 24 22 - 32 mmol/L   Glucose, Bld 93 70 - 99 mg/dL    Comment: Glucose reference range applies only to samples taken after fasting for at least 8 hours.   BUN 13 8 - 23 mg/dL   Creatinine, Ser 6.57 0.61 - 1.24 mg/dL   Calcium 8.4 (L) 8.9 - 10.3 mg/dL   GFR, Estimated >84 >69 mL/min    Comment: (NOTE) Calculated using the CKD-EPI Creatinine Equation (2021)    Anion gap 9 5 - 15    Comment: Performed at [facility], [address] [place], [place] [address]  CBC     Status: None   Collection Time: 11/29/23  4:04 AM  Result Value Ref Range   WBC 9.3 4.0 - 10.5 K/uL   RBC 4.38 4.22 - 5.81 MIL/uL   Hemoglobin 14.5 13.0 - 17.0 g/dL   HCT 84.1 32.4 - 40.1 %   MCV 95.9 80.0 - 100.0 fL   MCH 33.1 26.0 - 34.0 pg   MCHC 34.5 30.0 - 36.0 g/dL   RDW 02.7 25.3 - 66.4 %   Platelets 177 150 - 400 K/uL   nRBC 0.0 0.0 - 0.2 %    Comment: Performed at [facility], [address] [place], [place] [address]  Magnesium      Status: None   Collection Time: 11/29/23  4:04 AM  Result Value Ref Range   Magnesium  2.0 1.7 - 2.4 mg/dL    Comment: Performed at [facility], [address], [place], [place] [address]  Phosphorus     Status: None   Collection Time: 11/29/23  4:04 AM  Result Value Ref Range   Phosphorus 2.9 2.5 - 4.6 mg/dL    Comment: Performed at [facility], [address], [place], [place] [address]  TSH     Status: Abnormal   Collection Time: 11/29/23  4:04 AM  Result Value Ref Range   TSH 5.434 (H) 0.350 - 4.500 uIU/mL    Comment: Performed by a 3rd Generation assay with a functional sensitivity of <=0.01 uIU/mL. Performed at [facility], [address] [place], [place] [address]    Personally reviewed and showed the patient and wife.  Some minor dilation of the bowel but no transition, some hyperenhancement of the wall of the small bowel CT ABDOMEN PELVIS W CONTRAST Result Date: 11/28/2023 CLINICAL DATA:  Abdominal pain which  began this morning. EXAM: CT ABDOMEN AND PELVIS WITH CONTRAST TECHNIQUE: Multidetector CT imaging of the abdomen and pelvis was performed using the standard protocol following bolus administration of intravenous contrast. RADIATION DOSE REDUCTION: This exam was performed according to the departmental dose-optimization program which includes automated  exposure control, adjustment of the mA and/or kV according to patient size and/or use of iterative reconstruction technique. CONTRAST:  100mL OMNIPAQUE IOHEXOL 300 MG/ML  SOLN COMPARISON:  None Available. FINDINGS: Lower chest: The lung bases are clear of acute process. No pleural effusion or pulmonary lesions. Right basilar scarring changes noted. The heart is normal in size. No pericardial effusion. The distal esophagus and aorta are unremarkable. Hepatobiliary: No hepatic lesions or intrahepatic biliary dilatation. The gallbladder is contracted. No common bile duct dilatation. Pancreas: Unremarkable. No pancreatic ductal dilatation or surrounding inflammatory changes. Spleen: Normal in size without focal abnormality. Adrenals/Urinary Tract: The adrenal glands are normal. Simple left renal cysts not requiring any further imaging evaluation or follow-up. Small lower pole left renal calculus. 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 The bladder is normal. Stomach/Bowel: The stomach is distended with fluid and there is also fluid in a small hiatal hernia suggesting reflux. The duodenum and proximal small bowel loops are unremarkable. The mid small bowel loops demonstrate mild dilatation and are fluid-filled with scattered air-fluid levels. There is a transition to normal/decompressed loops of mid distal ileal loops in the lower abdomen. Findings suggest an early or partial small bowel obstruction or enteritis. Recommend correlation with bowel sounds and follow-up radiographs as indicated. No free air. Small amount of free pelvic fluid. No  significant colonic findings. Sigmoid colon diverticulosis. Vascular/Lymphatic: The aorta and branch vessels are patent. Scattered atherosclerotic calcifications. The major venous structures are patent. No mesenteric or retroperitoneal adenopathy. Reproductive: Mild prostate gland enlargement. The seminal vesicles are unremarkable. Other: Small amount of free pelvic fluid. Bilateral inguinal hernias containing. Musculoskeletal: No significant bony findings. IMPRESSION: 1. Findings suggest an early or partial small bowel obstruction or possibly enteritis. Recommend correlation with bowel sounds and follow-up radiographs as indicated. 2. Small amount of free pelvic fluid. No free air. 3. Small lower pole left renal calculus. 4. 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 5. Bilateral inguinal hernias containing fat. Electronically Signed   By: Marrian Siva M.D.   On: 11/28/2023 22:12     Assessment & Plan:  NASSIR QUINTIN is a 78 y.o. male with concern for enteritis/ ileus. He had a very large meal and felt bad after this. He denies any sick contacts. He had a BM today with a suppository but says it was starting to be more loose like diarrhea.   He is feeling better.  No surgical intervention indicated at this time Discussed that this is likely enteritis and will resolve on its on If this occurs again, then we would need to do further investigation Will give clear diet, adv slowly and told him to go slow Ambulate    All questions were answered to the satisfaction of the patient and family.  Updated team.   Awilda Bogus 11/29/2023, 9:12 AM

## 2023-11-29 NOTE — Progress Notes (Signed)
 PHARMACY - ANTICOAGULATION CONSULT NOTE  Pharmacy Consult for Apixaban Indication: atrial fibrillation  No Known Allergies  Patient Measurements: Height: 6' (182.9 cm) (stated) Weight: 91.4 kg (201 lb 8 oz) IBW/kg (Calculated) : 77.6 HEPARIN DW (KG): 91.4  Vital Signs: Temp: 98.1 F (36.7 C) (04/28 1500) Temp Source: Oral (04/28 1500) BP: 119/82 (04/28 1500) Pulse Rate: 70 (04/28 1500)  Labs: Recent Labs    11/28/23 2008 11/29/23 0404  HGB 15.5 14.5  HCT 45.3 42.0  PLT 190 177  CREATININE 0.90 0.72  TROPONINIHS 4  --     Estimated Creatinine Clearance: 87.6 mL/min (by C-G formula based on SCr of 0.72 mg/dL).   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 Pharmacy consulted to initiate patient on apixaban.  Cha2ds2vasc score is 3   Does patient qualify for dose reduction? No Age > 80? No Weight < 60 kg? No Scr >1.5? No  No signs/symptoms of bleeding in chart. Hgb 14.5. PLT 177.   Goal of Therapy:  Monitor platelets by anticoagulation protocol: Yes   Plan:  Start apixaban 5 mg twice daily  Monitor CBC and SCr weekly unless more frequent monitoring is needed  Alice Innocent, PharmD Clinical Pharmacist  11/29/2023,5:22 PM

## 2023-11-29 NOTE — Progress Notes (Signed)
*  PRELIMINARY RESULTS* Echocardiogram 2D Echocardiogram has been performed.  Bernis Brisker 11/29/2023, 3:32 PM

## 2023-11-29 NOTE — Care Management CC44 (Signed)
 Condition Code 44 Documentation Completed  Patient Details  Name: AVYON KELLEY MRN: [id number] Date of Birth: 1946/07/25   Condition Code 44 given:  Yes Patient signature on Condition Code 44 notice:  Yes Documentation of 2 MD's agreement:  Yes Code 44 added to claim:  Yes    Geraldina Klinefelter, RN 11/29/2023, 3:38 PM

## 2023-11-29 NOTE — Assessment & Plan Note (Signed)
 Clinically resolved. Ruled out of small bowel obstruction.   Patient was placed on supportive medical therapy and diet was advance slowly with good toleration.  At the time of his discharge his symptoms have resolved.

## 2023-11-29 NOTE — Plan of Care (Signed)
?  Problem: Education: ?Goal: Knowledge of General Education information will improve ?Description: Including pain rating scale, medication(s)/side effects and non-pharmacologic comfort measures ?Outcome: Progressing ?  ?Problem: Health Behavior/Discharge Planning: ?Goal: Ability to manage health-related needs will improve ?Outcome: Progressing ?  ?Problem: Clinical Measurements: ?Goal: Will remain free from infection ?Outcome: Progressing ?  ?Problem: Elimination: ?Goal: Will not experience complications related to bowel motility ?Outcome: Progressing ?Goal: Will not experience complications related to urinary retention ?Outcome: Progressing ?  ?

## 2023-11-29 NOTE — Progress Notes (Signed)
 Mobility Specialist Progress Note:    11/29/23 1500  Therapy Vitals  Temp 98.1 F (36.7 C)  Temp Source Oral  Pulse Rate 70  Resp 16  BP 119/82  Patient Position (if appropriate) Lying  Mobility  Activity Ambulated with assistance in hallway  Level of Assistance Modified independent, requires aide device or extra time  Assistive Device None  Distance Ambulated (ft) 200 ft  Range of Motion/Exercises Active;All extremities  Activity Response Tolerated well  Mobility Referral Yes  Mobility visit 1 Mobility  Mobility Specialist Start Time (ACUTE ONLY) 1440  Mobility Specialist Stop Time (ACUTE ONLY) 1500  Mobility Specialist Time Calculation (min) (ACUTE ONLY) 20 min   Pt received in bed, agreeable to mobility. ModI to stand and ambulate with no AD. Tolerated well, asx throughout. Returned to room,left pt sitting EOB. All needs met.   Glinda Lapping Mobility Specialist Please contact via Special educational needs teacher or  Rehab office at [phone]

## 2023-11-29 NOTE — H&P (Signed)
 History and Physical    Spencer [name] RUE:[id number] DOB: 05-30-1946 DOA: 11/28/2023  PCP: Omie Bickers, MD   Patient coming from: Home   Chief Complaint:  Chief Complaint  Patient presents with   Abdominal Pain    HPI:  Spencer [name] is a 78 y.o. male with hx of hypertension, GERD, anxiety, prior history colon adenoma and GI bleed, who presents with acute onset of central abdominal pain.  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  Pain was intermittent throughout the day.  Did have a small bowel movement at 4 AM and another large bowel movement at 8 AM.  However since onset of pain no bowel movement and not passing gas.  He has decreased appetite although denies nausea and vomiting.  At time of my interview he does not have any abdominal pain.  Pain appears to be improved with recumbency.  No prior history of abdominal surgeries.   Otherwise no prior history of A-fib.  He has no chest pain or sense of palpitations, shortness of breath.  No recent issues with any bleeding.  Does have a remote history of GI bleed.   Review of Systems:  ROS complete and negative except as marked above   No Known Allergies  Prior to Admission medications   Medication Sig Start Date End Date Taking? Authorizing Provider  acetaminophen  (TYLENOL ) 500 MG tablet Take 500 mg by mouth every 6 (six) hours as needed for moderate pain.    [provider]  ALPRAZolam [place]) 1 MG tablet Take 1 mg by mouth See admin instructions. Five night a week for anxiety    [provider]  amLODipine (NORVASC) 5 MG tablet Take 1 tablet by mouth daily. 04/07/19   [provider]  b complex vitamins capsule Take 1 capsule by mouth every other day.    [provider]  cholecalciferol (VITAMIN D3) 25 MCG (1000 UT) tablet Take 1,000 Units by mouth every other day.    [provider]  DHEA 50 MG  CAPS Take 50 mg by mouth daily.    [provider]  Emollient (VASELINE INTENSIVE CARE EX) Apply 1 application  topically at bedtime.    [provider]  famotidine  (PEPCID  AC) 10 MG chewable tablet Chew 10 mg by mouth at bedtime as needed for heartburn.     [provider]  ibuprofen (ADVIL) 200 MG tablet Take 200 mg by mouth every 6 (six) hours as needed (Pull muscle).    [provider]  Multiple Vitamin (MULTIVITAMIN WITH MINERALS) TABS tablet Take 1 tablet by mouth daily. 50+    [provider]  Omega-3 Fatty Acids (FISH OIL) 1200 MG CAPS Take 1,200 mg by mouth at bedtime.    [provider]  OVER THE COUNTER MEDICATION Take 1 capsule by mouth at bedtime. Beet  + co q 10 with  grape seed    [provider]  Polyethyl Glycol-Propyl Glycol (SYSTANE) 0.4-0.3 % SOLN Place 1 drop into both eyes 3 (three) times daily.    [provider]  Psyllium (METAMUCIL PO) Take 5 mLs by mouth at bedtime.    [provider]  sodium chloride  (OCEAN) 0.65 % SOLN nasal spray Place 1 spray into both nostrils at bedtime as needed for congestion (Sleep).    [provider]  Wheat Dextrin (BENEFIBER DRINK MIX PO) Take 5 mLs by mouth in  the morning.    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   Fracture    ribs/ right wrist x3/ left ankle/cracked vertebrae-cervical/   GERD (gastroesophageal reflux disease)    Primary localized osteoarthritis of left knee    Primary localized osteoarthritis of right knee     Past Surgical History:  Procedure Laterality Date   BASAL CELL CARCINOMA EXCISION     BIOPSY  11/02/2022   Procedure: BIOPSY;  Surgeon: Suzette Espy, MD;  Location: [facility];  Service: Endoscopy;;   COLONOSCOPY  07/04/2004   RMR: Diminutive rectal polyps, removed with cold biopsy forceps, otherwise normal/  Minimal left-sided  diverticula, the remainder of the colonic mucosa normal. Hyerplastic   COLONOSCOPY N/A 03/27/2013   Dr.Rourk-prominent internal hemorrhoids o/w normal rectum. scattered sigmoid diverticula with associated areas of submucosal petechiae. two 2mm diminutive polypoid lesions opposite the ileocecal valve the remainder of the colonic mucosa appeared normal. bx= tubular adenoma, benign hyperemic colorectal mucosa with focal active colitis   COLONOSCOPY N/A 05/26/2017   Procedure: COLONOSCOPY;  Surgeon: Suzette Espy, MD;  Location: [facility];  Service: Endoscopy;  Laterality: N/A;  7:30am   ESOPHAGOGASTRODUODENOSCOPY (EGD) WITH PROPOFOL  N/A 11/02/2022   Procedure: ESOPHAGOGASTRODUODENOSCOPY (EGD) WITH PROPOFOL ;  Surgeon: Suzette Espy, MD;  Location: [facility];  Service: Endoscopy;  Laterality: N/A;  9:45 am   FOOT SURGERY Left    X 2-otif   FOOT SURGERY Right 2003   HEMORRHOID BANDING     KNEE SURGERY Left 1973   open   OLECRANON BURSECTOMY Right 03/29/2014   Procedure: EXCISION OF RIGHT OLECRANON BURSA;  Surgeon: Pleasant Brilliant, MD;  Location: [facility];  Service: Orthopedics;  Laterality: Right;   TOTAL KNEE ARTHROPLASTY Left 06/29/2016   Procedure: LEFT TOTAL KNEE ARTHROPLASTY;  Surgeon: Elly Habermann, MD;  Location[facility] OR;  Service: Orthopedics;  Laterality: Left;   TOTAL KNEE ARTHROPLASTY Right 06/28/2017   Procedure: TOTAL KNEE ARTHROPLASTY;  Surgeon: Elly Habermann, MD;  Location: [facility] OR;  Service: Orthopedics;  Laterality: Right;     reports that he quit smoking about 48 years ago. His smoking use included cigarettes. He started smoking about 53 years ago. He has a 2.5 pack-year smoking history. He quit smokeless tobacco use about 45 years ago. He reports current alcohol use. He reports that he does not use drugs.  Family History  Problem Relation Age of Onset   Heart failure Mother    COPD Sister    Colon cancer Neg Hx      Physical Exam: Vitals:   11/29/23 0015 11/29/23 0047  11/29/23 0052 11/29/23 0100  BP: (!) 149/105  (!) 171/115 (!) 144/86  Pulse:   90   Resp:   16   Temp:   97.6 F (36.4 C)   TempSrc:   Oral   SpO2:   100%   Weight:  91.4 kg    Height:  6' (1.829 m)      Gen: Awake, alert, NAD   CV: Tachycardic, irregular, normal S1, S2, no murmurs  Resp: Normal WOB, CTAB  Abd: Rounded, slightly distended, hypoactive although present bowel sounds, nontender, no rebound, guarding, rigidity. MSK: Symmetric, no edema  Skin: No rashes or lesions to exposed skin  Neuro: Alert and interactive  Psych: euthymic, appropriate    Data review:   Labs reviewed, notable for:   K3.7  Bicarb 21, no anion gap WBC 12  Micro:  Results for orders placed or performed in visit on 06/16/19  Novel Coronavirus, NAA ([facility])     Status: None   Collection Time: 06/16/19 12:00 AM   Specimen: Nasopharyngeal(NP) swabs in vial transport medium   NASOPHARYNGE  TESTING  Result Value Ref Range Status   SARS-CoV-2, NAA Not Detected Not Detected Final    Comment: Testing was performed using the cobas(R) SARS-CoV-2 test. This nucleic acid amplification test was developed and its performance characteristics determined by [employer]. Nucleic acid amplification tests include PCR and TMA. This test has not been FDA cleared or approved. This test has been authorized by FDA under an Emergency Use Authorization (EUA). This test is only authorized for the duration of time the declaration that circumstances exist justifying the authorization of the emergency use of in vitro diagnostic tests for detection of SARS-CoV-2 virus and/or diagnosis of COVID-19 infection under section 564(b)(1) of the Act, 21 U.S.C. 960AVW-0(J) (1), unless the authorization is terminated or revoked sooner. When diagnostic testing is negative, the possibility of a false negative result should be considered in the context of a patient's recent exposures and the presence of clinical signs and  symptoms consistent with COVID-19. An individual without symptoms  of COVID-19 and who is not shedding SARS-CoV-2 virus would expect to have a negative (not detected) result in this assay.     Imaging reviewed:  CT ABDOMEN PELVIS W CONTRAST Result Date: 11/28/2023 CLINICAL DATA:  Abdominal pain which began this morning. EXAM: CT ABDOMEN AND PELVIS WITH CONTRAST TECHNIQUE: Multidetector CT imaging of the abdomen and pelvis was performed using the standard protocol following bolus administration of intravenous contrast. RADIATION DOSE REDUCTION: This exam was performed according to the departmental dose-optimization program which includes automated exposure control, adjustment of the mA and/or kV according to patient size and/or use of iterative reconstruction technique. CONTRAST:  100mL OMNIPAQUE IOHEXOL 300 MG/ML  SOLN COMPARISON:  None Available. FINDINGS: Lower chest: The lung bases are clear of acute process. No pleural effusion or pulmonary lesions. Right basilar scarring changes noted. The heart is normal in size. No pericardial effusion. The distal esophagus and aorta are unremarkable. Hepatobiliary: No hepatic lesions or intrahepatic biliary dilatation. The gallbladder is contracted. No common bile duct dilatation. Pancreas: Unremarkable. No pancreatic ductal dilatation or surrounding inflammatory changes. Spleen: Normal in size without focal abnormality. Adrenals/Urinary Tract: The adrenal glands are normal. Simple left renal cysts not requiring any further imaging evaluation or follow-up. Small lower pole left renal calculus. 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 The bladder is normal. Stomach/Bowel: The stomach is distended with fluid and there is also fluid in a small hiatal hernia suggesting reflux. The duodenum and proximal small bowel loops are unremarkable. The mid small bowel loops demonstrate mild dilatation and are fluid-filled with scattered air-fluid levels.  There is a transition to normal/decompressed loops of mid distal ileal loops in the lower abdomen. Findings suggest an early or partial small bowel obstruction or enteritis. Recommend correlation with bowel sounds and follow-up radiographs as indicated. No free air. Small amount of free pelvic fluid. No significant colonic findings. Sigmoid colon diverticulosis. Vascular/Lymphatic: The aorta and branch vessels are patent. Scattered atherosclerotic calcifications. The major venous structures are patent. No mesenteric or retroperitoneal adenopathy. Reproductive: Mild prostate gland enlargement. The seminal vesicles are unremarkable. Other: Small amount of free pelvic fluid. Bilateral inguinal hernias containing. Musculoskeletal: No significant bony findings. IMPRESSION: 1. Findings  suggest an early or partial small bowel obstruction or possibly enteritis. Recommend correlation with bowel sounds and follow-up radiographs as indicated. 2. Small amount of free pelvic fluid. No free air. 3. Small lower pole left renal calculus. 4. 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 5. Bilateral inguinal hernias containing fat. Electronically Signed   By: Marrian Siva M.D.   On: 11/28/2023 22:12   Personally reviewed CT abdomen/pelvis:    EKG: Personally reviewed A-fib at 102 bpm, LAD, low voltage precordial leads, PRWP, no acute ischemic changes.  ED Course:  Case was discussed with Dr. Larrie Po (general surgery)who will consult in the morning.  Since no  vomiting does not currently require NGT.  Treated with 500 cc IV fluid.   Assessment/Plan:  78 y.o. male with hx hypertension, GERD, anxiety, prior history colon adenoma and GI bleed, who presents with acute onset of central abdominal pain.  Found to have suspected ileus v partial small bowel obstruction, and new onset A-fib.   Ileus versus partial small bowel obstruction Acute central abdominal pain since this morning.  Did have a bowel movement last at  8 AM.  None since onset of pain, not passing flatus.  No N/V.  Abdominal exam reassuring.  CT abdomen pelvis demonstrating central small bowel dilation, question of small bowel enteritis.  - EDP spoke with Dr. Larrie Po of general surgery who will consult in the morning - Not currently requiring NGT - N.p.o. with ice chips and sips - MIVF NS at 75 cc an hour - Replete electrolytes, give bisacodyl suppository  New onset A-fib, rate controlled No prior history of arrhythmia or known heart disease.  Currently rate controlled in the 90s to low 100s.  CHA2DS2-VASc is 14 (age, hypertension, aortic atherosclerosis).  - Discussed finding of A-fib and stroke risk, recommending for initiation of anticoagulation. - For now he would like to think about starting on anticoagulation, we will continue discussion.  Hold off on initiation per his wishes.  - Start diltiazem 30 mg p.o. every 6 hours, if blood pressure tolerating and remains rate controlled can switch him to extended release.  Stop amlodipine because starting on diltiazem. - Check echo to evaluate for underlying substrate for A-fib.  Check TSH - Consider sending with cardiac monitor to evaluate for burden of A-fib.  Chronic medical problems: History of hypertension: Switching from amlodipine to diltiazem per above. GERD: Sub home PPI Anxiety: Continue home Xanax nightly as needed History colon Adenoma on GI bleed: Noted follows with GI outpatient   Body mass index is 27.33 kg/m.    DVT prophylaxis:  SCDs Code Status:  Full Code Diet:  Diet Orders (From admission, onward)     Start     Ordered   11/28/23 2307  Diet NPO time specified Except for: Ice Chips, Sips with Meds  Diet effective now       Question Answer Comment  Except for Ice Chips   Except for Sips with Meds      11/28/23 2310           Family Communication:  None   Consults:  General Surgery   Admission status:   Inpatient, Telemetry bed  Severity of Illness: The  appropriate patient status for this patient is INPATIENT. Inpatient status is judged to be reasonable and necessary in order to provide the required intensity of service to ensure the patient's safety. The patient's presenting symptoms, physical exam findings, and initial radiographic and laboratory data in the context of their  chronic comorbidities is felt to place them at high risk for further clinical deterioration. Furthermore, it is not anticipated that the patient will be medically stable for discharge from the hospital within 2 midnights of admission.   * I certify that at the point of admission it is my clinical judgment that the patient will require inpatient hospital care spanning beyond 2 midnights from the point of admission due to high intensity of service, high risk for further deterioration and high frequency of surveillance required.*   Arnulfo Larch, MD [facility]  How to contact the [facility] Attending or Consulting provider 7A - 7P or covering provider during after hours 7P -7A, for this patient.  Check the care team in [facility] and look for a) attending/consulting [facility] provider listed and b) the [facility] team listed Log into [email or web address] and use [place]'s universal password to access. If you do not have the password, please contact the hospital operator. Locate the [facility] provider you are looking for under [facility] and page to a number that you can be directly reached. If you still have difficulty reaching the provider, please page the [facility] (Director on Call) for the Hospitalists listed on amion for assistance.  11/29/2023, 4:27 AM

## 2023-11-29 NOTE — Assessment & Plan Note (Addendum)
 Echocardiogram with preserved LV systolic function with EF 60 to 65%, RV systolic function preserved, LA and RA with mild dilatation, no significant valvular disease.   Plan to transition from diltiazem to metoprolol for rate control.  His Cha2ds2vasc  score is 3, risk vs benefit will start patient on anticoagulation with apixaban.  Continue telemetry monitoring.   EGD on 04.24 with Barrett's esophagus with erosive reflux esophagitis. Normal duodenal bulb and second portion of the duodenum.

## 2023-11-29 NOTE — Care Management Obs Status (Signed)
 MEDICARE OBSERVATION STATUS NOTIFICATION   Patient Details  Name: Spencer [name] MRN: [id number] Date of Birth: 09-21-1945   Medicare Observation Status Notification Given:  Yes    Geraldina Klinefelter, RN 11/29/2023, 3:38 PM

## 2023-11-30 ENCOUNTER — Other Ambulatory Visit (HOSPITAL_COMMUNITY): Payer: Self-pay

## 2023-11-30 DIAGNOSIS — K529 Noninfective gastroenteritis and colitis, unspecified: Secondary | ICD-10-CM | POA: Diagnosis not present

## 2023-11-30 DIAGNOSIS — Z79899 Other long term (current) drug therapy: Secondary | ICD-10-CM | POA: Diagnosis not present

## 2023-11-30 DIAGNOSIS — Z87891 Personal history of nicotine dependence: Secondary | ICD-10-CM | POA: Diagnosis not present

## 2023-11-30 DIAGNOSIS — Z96653 Presence of artificial knee joint, bilateral: Secondary | ICD-10-CM | POA: Diagnosis not present

## 2023-11-30 DIAGNOSIS — I4891 Unspecified atrial fibrillation: Secondary | ICD-10-CM | POA: Diagnosis not present

## 2023-11-30 DIAGNOSIS — K219 Gastro-esophageal reflux disease without esophagitis: Secondary | ICD-10-CM | POA: Diagnosis not present

## 2023-11-30 DIAGNOSIS — Z85828 Personal history of other malignant neoplasm of skin: Secondary | ICD-10-CM | POA: Diagnosis not present

## 2023-11-30 DIAGNOSIS — K567 Ileus, unspecified: Secondary | ICD-10-CM | POA: Diagnosis not present

## 2023-11-30 DIAGNOSIS — I10 Essential (primary) hypertension: Secondary | ICD-10-CM | POA: Diagnosis not present

## 2023-11-30 LAB — BASIC METABOLIC PANEL WITH GFR
Anion gap: 8 (ref 5–15)
BUN: 7 mg/dL — ABNORMAL LOW (ref 8–23)
CO2: 24 mmol/L (ref 22–32)
Calcium: 8.5 mg/dL — ABNORMAL LOW (ref 8.9–10.3)
Chloride: 106 mmol/L (ref 98–111)
Creatinine, Ser: 0.71 mg/dL (ref 0.61–1.24)
GFR, Estimated: >60 mL/min (ref >60)
Glucose, Bld: 88 mg/dL (ref 70–99)
Potassium: 3.7 mmol/L (ref 3.5–5.1)
Sodium: 138 mmol/L (ref 135–145)

## 2023-11-30 LAB — CBC
HCT: 42.1 % (ref 39.0–52.0)
Hemoglobin: 14.2 g/dL (ref 13.0–17.0)
MCH: 32.6 pg (ref 26.0–34.0)
MCHC: 33.7 g/dL (ref 30.0–36.0)
MCV: 96.8 fL (ref 80.0–100.0)
Platelets: 173 10*3/uL (ref 150–400)
RBC: 4.35 MIL/uL (ref 4.22–5.81)
RDW: 11.9 % (ref 11.5–15.5)
WBC: 6.8 10*3/uL (ref 4.0–10.5)
nRBC: 0 % (ref 0.0–0.2)

## 2023-11-30 MED ORDER — APIXABAN 5 MG PO TABS: 5.0000 mg | ORAL_TABLET | Freq: Two times a day (BID) | ORAL | 0 refills | Status: DC

## 2023-11-30 MED ORDER — METOPROLOL TARTRATE 25 MG PO TABS: 25.0000 mg | ORAL_TABLET | Freq: Two times a day (BID) | ORAL | 0 refills | Status: DC

## 2023-11-30 NOTE — Plan of Care (Signed)

## 2023-11-30 NOTE — Discharge Summary (Signed)
 Physician Discharge Summary   Patient: Spencer [name] MRN: [id number] DOB: Nov 30, 1945  Admit date:     11/28/2023  Discharge date: 11/30/23  Discharge Physician: Curlee Doss Thao Bauza   PCP: Omie Bickers, MD   Recommendations at discharge:    Patient was placed on metoprolol for rate control atrial fibrillation and apixaban for anticoagulation. Discontinue amlodipine.  Follow up with Atrial Fibrillation clinic 12/07/23 at 14:00  Follow up with Dr Del Favia in 7 to 10 days.   Discharge Diagnoses: Active Problems:   Enteritis   Essential hypertension   New onset a-fib (HCC)  Resolved Problems:   * No resolved hospital problems. [facility] Course: Spencer [name] was admitted to the hospital with the working diagnosis of enteritis.   78 yo male with the past medical history of hypertension, GERD, remote history of GI bleed and anxiety who presented with abdominal pain. After a large meal he developed colicky abdominal pain, epigastric in location and severe in intensity. On his initial physical examination his blood pressure was 149/105, HR 90, RR 16 and 02 saturation 100% Lungs with no wheezing or rhonchi, heart with S1 and S2 present, irregular with no gallops or murmurs, abdomen distended with hypoactive bowel sounds with no tenderness or guarding, no lower extremity edema.   Na 137, K 3,7 Cl 106 bicarbonate 21, glucose 124, bun 17 cr 0,90 Mg 1.9  AST 32 ALT 33  Wbc 12.5 hgb 15,5 plt 190  TSH 2,87  Urine analysis SG 1,021, negative protein, negative leukocytes and negative hgb   CT abdomen and pelvis with findings suggesting an early or partial small bowel obstruction or possible enteritis. Small amount of free pelvic fluids.  Small lower pole left renal calculus.  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   EKG 103 bpm, left axis, deviation, qtc 477, atrial fibrillation rhythm with PVC, with no significant ST segment or T wave changes.   Patient was placed on supportive  conservative treatment with improvement in his symptoms.  Plan to follow up as outpatient.    Assessment and Plan: Enteritis Clinically resolved. Ruled out of small bowel obstruction.   Patient was placed on supportive medical therapy and diet was advance slowly with good toleration.  At the time of his discharge his symptoms have resolved.   Essential hypertension Blood pressure has been controlled.  Continue blood pressure control with metoprolol Hold on amlodipine.   New onset a-fib [facility]) Echocardiogram with preserved LV systolic function with EF 60 to 65%, RV systolic function preserved, LA and RA with mild dilatation, no significant valvular disease.   Patient was transitioned from diltiazem to metoprolol for rate control.  His Cha2ds2vasc  score is 3, risk vs benefit will start patient on anticoagulation with apixaban.   EGD on 04.24 with Barrett's esophagus with erosive reflux esophagitis. Normal duodenal bulb and second portion of the duodenum.        Consultants: surgery  Procedures performed: none   Disposition: Home Diet recommendation:  Cardiac diet DISCHARGE MEDICATION: Allergies as of 11/30/2023   No Known Allergies      Medication List     STOP taking these medications    amLODipine 5 MG tablet Commonly known as: NORVASC   ibuprofen 200 MG tablet Commonly known as: ADVIL       TAKE these medications    acetaminophen  500 MG tablet Commonly known as: TYLENOL  Take 500 mg by mouth every 6 (six) hours as needed for moderate pain.  ALPRAZolam 1 MG tablet Commonly known as: XANAX Take 1 mg by mouth See admin instructions. Five night a week for anxiety   apixaban 5 MG Tabs tablet Commonly known as: ELIQUIS Take 1 tablet (5 mg total) by mouth 2 (two) times daily.   b complex vitamins capsule Take 1 capsule by mouth every other day.   BENEFIBER DRINK MIX PO Take 5 mLs by mouth in the morning.   cholecalciferol 25 MCG (1000 UNIT)  tablet Commonly known as: VITAMIN D3 Take 1,000 Units by mouth every other day.   Fish Oil 1200 MG Caps Take 1,200 mg by mouth at bedtime.   METAMUCIL PO Take 5 mLs by mouth at bedtime.   metoprolol tartrate 25 MG tablet Commonly known as: LOPRESSOR Take 1 tablet (25 mg total) by mouth 2 (two) times daily.   multivitamin with minerals Tabs tablet Take 1 tablet by mouth daily. 50+   omeprazole  20 MG capsule Commonly known as: PRILOSEC  Take 20 mg by mouth daily.   OVER THE COUNTER MEDICATION Take 1 capsule by mouth at bedtime. Beet  + co q 10 with  grape seed   sodium chloride  0.65 % Soln nasal spray Commonly known as: OCEAN Place 1 spray into both nostrils at bedtime as needed for congestion (Sleep).   Systane 0.4-0.3 % Soln Generic drug: Polyethyl Glycol-Propyl Glycol Place 1 drop into both eyes daily.   VASELINE INTENSIVE CARE EX Apply 1 application  topically at bedtime.        Discharge Exam: Filed Weights   11/28/23 1957 11/29/23 0047  Weight: 83.9 kg 91.4 kg   BP (!) 144/99 (BP Location: Right Arm)   Pulse 95   Temp 97.6 F (36.4 C) (Oral)   Resp 18   Ht 6' (1.829 m) Comment: stated  Wt 91.4 kg   SpO2 98%   BMI 27.33 kg/m   Patient with no chest pain or dyspnea, no palpitation, no PND, lower extremity edema or PND  Neurology awake and alert ENT with no pallor or icterus Cardiovascular with S1 and S2 present, irregularly irregular with no gallops, rubs or murmurs Respiratory with no rales or wheezing, no rhonchi  Abdomen with no distention, soft and non tender, positive bowel sounds No lower extremity edema   Condition at discharge: stable  The results of significant diagnostics from this hospitalization (including imaging, microbiology, ancillary and laboratory) are listed below for reference.   Imaging Studies: ECHOCARDIOGRAM COMPLETE Result Date: 11/29/2023    ECHOCARDIOGRAM REPORT   Patient Name:   Spencer [name] Date of Exam: 11/29/2023  Medical Rec #:  [id number]        Height:       72.0 in Accession #:    [id number]       Weight:       201.5 lb Date of Birth:  [birth year]        BSA:          2.137 m Patient Age:    77 years         BP:           128/79 mmHg Patient Gender: M                HR:           75 bpm. Exam Location:  [facility] Procedure: 2D Echo, Cardiac Doppler and Color Doppler (Both Spectral and Color            Flow Doppler were utilized  during procedure). Indications:    Atrial Fibrillation I48.91  History:        Patient has no prior history of Echocardiogram examinations.                 Arrythmias:Atrial Fibrillation; Risk Factors:Hypertension.  Sonographer:    Denese Finn RCS Referring Phys: [id number] JONATHAN SEGARS IMPRESSIONS  1. Left ventricular ejection fraction, by estimation, is 60 to 65%. The left ventricle has normal function. The left ventricle has no regional wall motion abnormalities. Left ventricular diastolic parameters are indeterminate.  2. Right ventricular systolic function is normal. The right ventricular size is normal. There is normal pulmonary artery systolic pressure.  3. Left atrial size was mildly dilated.  4. Right atrial size was mildly dilated.  5. The mitral valve is normal in structure. Trivial mitral valve regurgitation. No evidence of mitral stenosis.  6. The tricuspid valve is abnormal.  7. The aortic valve is tricuspid. Aortic valve regurgitation is mild. No aortic stenosis is present.  8. The inferior vena cava is dilated in size with <50% respiratory variability, suggesting right atrial pressure of 15 mmHg. FINDINGS  Left Ventricle: Left ventricular ejection fraction, by estimation, is 60 to 65%. The left ventricle has normal function. The left ventricle has no regional wall motion abnormalities. The left ventricular internal cavity size was normal in size. There is  no left ventricular hypertrophy. Left ventricular diastolic parameters are indeterminate. Right Ventricle: The right  ventricular size is normal. Right vetricular wall thickness was not well visualized. Right ventricular systolic function is normal. There is normal pulmonary artery systolic pressure. The tricuspid regurgitant velocity is 2.60 m/s, and with an assumed right atrial pressure of 3 mmHg, the estimated right ventricular systolic pressure is 30.0 mmHg. Left Atrium: Left atrial size was mildly dilated. Right Atrium: Right atrial size was mildly dilated. Pericardium: There is no evidence of pericardial effusion. Mitral Valve: The mitral valve is normal in structure. Trivial mitral valve regurgitation. No evidence of mitral valve stenosis. Tricuspid Valve: The tricuspid valve is abnormal. Tricuspid valve regurgitation is mild . No evidence of tricuspid stenosis. Aortic Valve: The aortic valve is tricuspid. Aortic valve regurgitation is mild. No aortic stenosis is present. Pulmonic Valve: The pulmonic valve was not well visualized. Pulmonic valve regurgitation is not visualized. No evidence of pulmonic stenosis. Aorta: The aortic root is normal in size and structure. Venous: The inferior vena cava is dilated in size with less than 50% respiratory variability, suggesting right atrial pressure of 15 mmHg. IAS/Shunts: No atrial level shunt detected by color flow Doppler.  LEFT VENTRICLE PLAX 2D LVIDd:         5.00 cm LVIDs:         2.90 cm LV PW:         0.90 cm LV IVS:        1.00 cm LVOT diam:     2.10 cm LV SV:         53 LV SV Index:   25 LVOT Area:     3.46 cm  RIGHT VENTRICLE TAPSE (M-mode): 2.4 cm LEFT ATRIUM             Index        RIGHT ATRIUM           Index LA diam:        4.10 cm 1.92 cm/m   RA Area:     26.80 cm LA Vol (A2C):   80.7 ml 37.76 ml/m  RA  Volume:   87.70 ml  41.03 ml/m LA Vol (A4C):   99.3 ml 46.46 ml/m LA Biplane Vol: 91.7 ml 42.90 ml/m  AORTIC VALVE LVOT Vmax:   74.17 cm/s LVOT Vmean:  58.333 cm/s LVOT VTI:    0.153 m  AORTA Ao Root diam: 3.60 cm MITRAL VALVE               TRICUSPID VALVE MV  Area (PHT): 3.53 cm    TR Peak grad:   27.0 mmHg MV Decel Time: 215 msec    TR Vmax:        260.00 cm/s MV E velocity: 87.10 cm/s                            SHUNTS                            Systemic VTI:  0.15 m                            Systemic Diam: 2.10 cm Armida Lander MD Electronically signed by Armida Lander MD Signature Date/Time: 11/29/2023/3:49:40 PM    Final    CT ABDOMEN PELVIS W CONTRAST Result Date: 11/28/2023 CLINICAL DATA:  Abdominal pain which began this morning. EXAM: CT ABDOMEN AND PELVIS WITH CONTRAST TECHNIQUE: Multidetector CT imaging of the abdomen and pelvis was performed using the standard protocol following bolus administration of intravenous contrast. RADIATION DOSE REDUCTION: This exam was performed according to the departmental dose-optimization program which includes automated exposure control, adjustment of the mA and/or kV according to patient size and/or use of iterative reconstruction technique. CONTRAST:  100mL OMNIPAQUE IOHEXOL 300 MG/ML  SOLN COMPARISON:  None Available. FINDINGS: Lower chest: The lung bases are clear of acute process. No pleural effusion or pulmonary lesions. Right basilar scarring changes noted. The heart is normal in size. No pericardial effusion. The distal esophagus and aorta are unremarkable. Hepatobiliary: No hepatic lesions or intrahepatic biliary dilatation. The gallbladder is contracted. No common bile duct dilatation. Pancreas: Unremarkable. No pancreatic ductal dilatation or surrounding inflammatory changes. Spleen: Normal in size without focal abnormality. Adrenals/Urinary Tract: The adrenal glands are normal. Simple left renal cysts not requiring any further imaging evaluation or follow-up. Small lower pole left renal calculus. 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 The bladder is normal. Stomach/Bowel: The stomach is distended with fluid and there is also fluid in a small hiatal hernia suggesting reflux. The  duodenum and proximal small bowel loops are unremarkable. The mid small bowel loops demonstrate mild dilatation and are fluid-filled with scattered air-fluid levels. There is a transition to normal/decompressed loops of mid distal ileal loops in the lower abdomen. Findings suggest an early or partial small bowel obstruction or enteritis. Recommend correlation with bowel sounds and follow-up radiographs as indicated. No free air. Small amount of free pelvic fluid. No significant colonic findings. Sigmoid colon diverticulosis. Vascular/Lymphatic: The aorta and branch vessels are patent. Scattered atherosclerotic calcifications. The major venous structures are patent. No mesenteric or retroperitoneal adenopathy. Reproductive: Mild prostate gland enlargement. The seminal vesicles are unremarkable. Other: Small amount of free pelvic fluid. Bilateral inguinal hernias containing. Musculoskeletal: No significant bony findings. IMPRESSION: 1. Findings suggest an early or partial small bowel obstruction or possibly enteritis. Recommend correlation with bowel sounds and follow-up radiographs as indicated. 2. Small amount of free  pelvic fluid. No free air. 3. Small lower pole left renal calculus. 4. 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 5. Bilateral inguinal hernias containing fat. Electronically Signed   By: Marrian Siva M.D.   On: 11/28/2023 22:12    Microbiology: Results for orders placed or performed in visit on 06/16/19  Novel Coronavirus, NAA ([facility])     Status: None   Collection Time: 06/16/19 12:00 AM   Specimen: Nasopharyngeal(NP) swabs in vial transport medium   NASOPHARYNGE  TESTING  Result Value Ref Range Status   SARS-CoV-2, NAA Not Detected Not Detected Final    Comment: Testing was performed using the cobas(R) SARS-CoV-2 test. This nucleic acid amplification test was developed and its performance characteristics determined by [employer]. Nucleic acid amplification tests  include PCR and TMA. This test has not been FDA cleared or approved. This test has been authorized by FDA under an Emergency Use Authorization (EUA). This test is only authorized for the duration of time the declaration that circumstances exist justifying the authorization of the emergency use of in vitro diagnostic tests for detection of SARS-CoV-2 virus and/or diagnosis of COVID-19 infection under section 564(b)(1) of the Act, 21 U.S.C. 161WRU-0(A) (1), unless the authorization is terminated or revoked sooner. When diagnostic testing is negative, the possibility of a false negative result should be considered in the context of a patient's recent exposures and the presence of clinical signs and symptoms consistent with COVID-19. An individual without symptoms  of COVID-19 and who is not shedding SARS-CoV-2 virus would expect to have a negative (not detected) result in this assay.     Labs: CBC: Recent Labs  Lab 11/28/23 2008 11/29/23 0404 11/30/23 0347  WBC 12.5* 9.3 6.8  HGB 15.5 14.5 14.2  HCT 45.3 42.0 42.1  MCV 95.4 95.9 96.8  PLT 190 177 173   Basic Metabolic Panel: Recent Labs  Lab 11/28/23 2008 11/29/23 0404 11/30/23 0347  NA 137 138 138  K 3.7 3.8 3.7  CL 106 105 106  CO2 21* 24 24  GLUCOSE 124* 93 88  BUN 17 13 7*  CREATININE 0.90 0.72 0.71  CALCIUM 8.9 8.4* 8.5*  MG 1.9 2.0  --   PHOS  --  2.9  --    Liver Function Tests: Recent Labs  Lab 11/28/23 2008  AST 32  ALT 33  ALKPHOS 53  BILITOT 0.8  PROT 7.0  ALBUMIN 3.7   CBG: No results for input(s): "GLUCAP" in the last 168 hours.  Discharge time spent: greater than 30 minutes.  Signed: Albertus Alt, MD [facility] 11/30/2023

## 2023-11-30 NOTE — Progress Notes (Signed)
 Patient discharged home with instructions given on medications and follow up visits,patient verbalized understanding . Prescriptions sent to Pharmacy of choice documented on AVS.IV discontinued,catheter intact, Accompanied by staff to an awaiting vehicle.

## 2023-11-30 NOTE — Progress Notes (Signed)
 Mobility Specialist Progress Note:    11/30/23 0925  Mobility  Activity Ambulated with assistance in hallway  Level of Assistance Modified independent, requires aide device or extra time  Assistive Device None  Distance Ambulated (ft) 300 ft  Range of Motion/Exercises Active;All extremities  Activity Response Tolerated well  Mobility Referral Yes  Mobility visit 1 Mobility  Mobility Specialist Start Time (ACUTE ONLY) [id number]  Mobility Specialist Stop Time (ACUTE ONLY) 0925  Mobility Specialist Time Calculation (min) (ACUTE ONLY) 20 min   Pt received in bed, agreeable to mobility. ModI to stand and ambulate with no AD. Tolerated well, asx throughout. Returned to room, left pt sitting EOB with nurse. All needs met.  Glinda Lapping Mobility Specialist Please contact via Special educational needs teacher or  Rehab office at [phone]

## 2023-12-07 ENCOUNTER — Ambulatory Visit (HOSPITAL_COMMUNITY)
Admit: 2023-12-07 | Discharge: 2023-12-07 | Disposition: A | Discharge status: Home / Self Care | Attending: Internal Medicine | Admitting: Internal Medicine

## 2023-12-07 VITALS — BP 126/90 | HR 89 | Ht 72.0 in | Wt 197.8 lb

## 2023-12-07 DIAGNOSIS — I4819 Other persistent atrial fibrillation: Secondary | ICD-10-CM | POA: Diagnosis not present

## 2023-12-07 DIAGNOSIS — Z09 Encounter for follow-up examination after completed treatment for conditions other than malignant neoplasm: Secondary | ICD-10-CM | POA: Diagnosis not present

## 2023-12-07 DIAGNOSIS — D6869 Other thrombophilia: Secondary | ICD-10-CM | POA: Diagnosis not present

## 2023-12-07 DIAGNOSIS — I4891 Unspecified atrial fibrillation: Secondary | ICD-10-CM | POA: Diagnosis not present

## 2023-12-07 DIAGNOSIS — K529 Noninfective gastroenteritis and colitis, unspecified: Secondary | ICD-10-CM | POA: Diagnosis not present

## 2023-12-07 DIAGNOSIS — I10 Essential (primary) hypertension: Secondary | ICD-10-CM | POA: Diagnosis not present

## 2023-12-07 MED ORDER — APIXABAN 5 MG PO TABS: 5.0000 mg | ORAL_TABLET | Freq: Two times a day (BID) | ORAL | 4 refills | Status: DC

## 2023-12-07 MED ORDER — METOPROLOL TARTRATE 25 MG PO TABS: 25.0000 mg | ORAL_TABLET | Freq: Two times a day (BID) | ORAL | 4 refills | Status: DC

## 2023-12-07 NOTE — Patient Instructions (Addendum)
 Cardioversion scheduled for: Wednesday, May 21   - Arrive at the [facility] of [facility] ([address]  and check in with ADMITTING at 10:00am   - Do not eat or drink anything after midnight the night prior to your procedure.   - Take all your morning medication (except diabetic medications) with a sip of water  prior to arrival.  - Do NOT miss any doses of your blood thinner - if you should miss a dose or take a dose more than 4 hours late -- please notify our office immediately.  - You will not be able to drive home after your procedure. Please ensure you have a responsible adult to drive you home. You will need someone with you for 24 hours post procedure.     - Expect to be in the procedural area approximately 2 hours.   - If you feel as if you go back into normal rhythm prior to scheduled cardioversion, please notify our office immediately.   If your procedure is canceled in the cardioversion suite you will be charged a cancellation fee.

## 2023-12-07 NOTE — Progress Notes (Signed)
 Primary Care Physician: Omie Bickers, MD Primary Cardiologist: None Electrophysiologist: None     Referring Physician: Albertus Alt, MD     Spencer [name] is a 78 y.o. male with a history of HTN, GERD, Barrett's esophagus, remote history of GI bleed, and atrial fibrillation who presents for consultation in the [facility]. Hospital admission 4/27-29/2025 for abdominal pain likely enteritis found to be in new Afib. Patient is on Eliquis  for a CHADS2VASC score of 3.  On evaluation today, he is currently in Afib. He is taking Lopressor  25 mg BID. No bleeding issues on Eliquis  5 mg BID. He is very active with exercise 4 days a week, golf 2 days a week, and fishing on Saturday. He maybe has felt more tired since diagnosis. He notes to have two alcoholic beverages daily, typically one beer and one small spirit. He does note unfortunately son passed away in Dec 28, 2017 and has been very difficult to deal with.   Today, he denies symptoms of palpitations, chest pain, shortness of breath, orthopnea, PND, lower extremity edema, dizziness, presyncope, syncope, snoring, daytime somnolence, bleeding, or neurologic sequela. The patient is tolerating medications without difficulties and is otherwise without complaint today.    Atrial Fibrillation Risk Factors:  he does have symptoms or diagnosis of sleep apnea.   he has a BMI of Body mass index is 26.83 kg/m.[name] Filed Weights   12/07/23 1342  Weight: 89.7 kg    Current Outpatient Medications  Medication Sig Dispense Refill   acetaminophen  (TYLENOL ) 500 MG tablet Take 500 mg by mouth as needed for moderate pain (pain score 4-6).     ALPRAZolam  (XANAX ) 1 MG tablet Take 1 mg by mouth See admin instructions. Five night a week for anxiety     b complex vitamins capsule Take 1 capsule by mouth in the morning.     cholecalciferol (VITAMIN D3) 25 MCG (1000 UT) tablet Take 1,000 Units by mouth daily.     Emollient  (VASELINE INTENSIVE CARE EX) Apply 1 application  topically at bedtime.     Multiple Vitamin (MULTIVITAMIN WITH MINERALS) TABS tablet Take 1 tablet by mouth daily. 50+     Omega-3 Fatty Acids (FISH OIL) 1200 MG CAPS Take 1,200 mg by mouth at bedtime.     omeprazole  (PRILOSEC ) 20 MG capsule Take 20 mg by mouth daily.     OVER THE COUNTER MEDICATION Take 1 capsule by mouth at bedtime. Beet  + co q 10 with  grape seed     Polyethyl Glycol-Propyl Glycol (SYSTANE) 0.4-0.3 % SOLN Place 1 drop into both eyes daily.     Psyllium (METAMUCIL PO) Take 5 mLs by mouth at bedtime.     sodium chloride  (OCEAN) 0.65 % SOLN nasal spray Place 1 spray into both nostrils at bedtime as needed for congestion (Sleep).     Wheat Dextrin (BENEFIBER DRINK MIX PO) Take 5 mLs by mouth in the morning.     apixaban  (ELIQUIS ) 5 MG TABS tablet Take 1 tablet (5 mg total) by mouth 2 (two) times daily. 60 tablet 4   metoprolol  tartrate (LOPRESSOR ) 25 MG tablet Take 1 tablet (25 mg total) by mouth 2 (two) times daily. 60 tablet 4   No current facility-administered medications for this encounter.    Atrial Fibrillation Management history:  Previous antiarrhythmic drugs: none Previous cardioversions: none Previous ablations: none Anticoagulation history: Eliquis    ROS- All systems are reviewed and negative except as per the HPI  above.  Physical Exam: BP (!) 126/90   Pulse 89   Ht 6' (1.829 m)   Wt 89.7 kg   BMI 26.83 kg/m   GEN: Well nourished, well developed in no acute distress NECK: No JVD; No carotid bruits CARDIAC: Irregularly irregular rate and rhythm, no murmurs, rubs, gallops RESPIRATORY:  Clear to auscultation without rales, wheezing or rhonchi  ABDOMEN: Soft, non-tender, non-distended EXTREMITIES:  No edema; No deformity   EKG today demonstrates  Vent. rate 89 BPM PR interval * ms QRS duration 80 ms QT/QTcB 376/457 ms P-R-T axes * -37 10 Atrial fibrillation with a competing junctional  pacemaker Left axis deviation Cannot rule out Anterior infarct , age undetermined Abnormal ECG When compared with ECG of 28-Nov-2023 22:55, PREVIOUS ECG IS PRESENT  Echo 11/29/23 demonstrated  1. Left ventricular ejection fraction, by estimation, is 60 to 65%. The  left ventricle has normal function. The left ventricle has no regional  wall motion abnormalities. Left ventricular diastolic parameters are  indeterminate.   2. Right ventricular systolic function is normal. The right ventricular  size is normal. There is normal pulmonary artery systolic pressure.   3. Left atrial size was mildly dilated.   4. Right atrial size was mildly dilated.   5. The mitral valve is normal in structure. Trivial mitral valve  regurgitation. No evidence of mitral stenosis.   6. The tricuspid valve is abnormal.   7. The aortic valve is tricuspid. Aortic valve regurgitation is mild. No  aortic stenosis is present.   8. The inferior vena cava is dilated in size with <50% respiratory  variability, suggesting right atrial pressure of 15 mmHg.   ASSESSMENT & PLAN CHA2DS2-VASc Score = 3  The patient's score is based upon: CHF History: 0 HTN History: 1 Diabetes History: 0 Stroke History: 0 Vascular Disease History: 0 Age Score: 2 Gender Score: 0       ASSESSMENT AND PLAN: Persistent Atrial Fibrillation (ICD10:  I48.19) The patient's CHA2DS2-VASc score is 3, indicating a 3.2% annual risk of stroke.    He is currently in Afib. Labs done on 4/29. We discussed the procedure cardioversion to try to convert to NSR. We discussed the risks vs benefits of this procedure and how ultimately we cannot predict whether a patient will have early return of arrhythmia post procedure. After discussion, the patient wishes to proceed with cardioversion. Labs drawn on 4/29. Will revisit snoring / sleep study at next visit. Continue Lopressor  25 mg BID.  Informed Consent   Shared Decision Making/Informed Consent The  risks (stroke, cardiac arrhythmias rarely resulting in the need for a temporary or permanent pacemaker, skin irritation or burns and complications associated with conscious sedation including aspiration, arrhythmia, respiratory failure and death), benefits (restoration of normal sinus rhythm) and alternatives of a direct current cardioversion were explained in detail to Mr. Wiesemann and he agrees to proceed.        Secondary Hypercoagulable State (ICD10:  D68.69) The patient is at significant risk for stroke/thromboembolism based upon his CHA2DS2-VASc Score of 3.  Continue Apixaban  (Eliquis ).   Continue Eliquis  5 mg BID without interruption. No missed doses.     Follow up 2 weeks after DCCV.    Minnie Amber, PA-C  [facility] [address] [place], [place] [address] [phone]

## 2023-12-07 NOTE — H&P (View-Only) (Signed)
 Primary Care Physician: Omie Bickers, MD Primary Cardiologist: None Electrophysiologist: None     Referring Physician: Albertus Alt, MD     Spencer [name] is a 78 y.o. male with a history of HTN, GERD, Barrett's esophagus, remote history of GI bleed, and atrial fibrillation who presents for consultation in the [facility]. Hospital admission 4/27-29/2025 for abdominal pain likely enteritis found to be in new Afib. Patient is on Eliquis  for a CHADS2VASC score of 3.  On evaluation today, he is currently in Afib. He is taking Lopressor  25 mg BID. No bleeding issues on Eliquis  5 mg BID. He is very active with exercise 4 days a week, golf 2 days a week, and fishing on Saturday. He maybe has felt more tired since diagnosis. He notes to have two alcoholic beverages daily, typically one beer and one small spirit. He does note unfortunately son passed away in Dec 28, 2017 and has been very difficult to deal with.   Today, he denies symptoms of palpitations, chest pain, shortness of breath, orthopnea, PND, lower extremity edema, dizziness, presyncope, syncope, snoring, daytime somnolence, bleeding, or neurologic sequela. The patient is tolerating medications without difficulties and is otherwise without complaint today.    Atrial Fibrillation Risk Factors:  he does have symptoms or diagnosis of sleep apnea.   he has a BMI of Body mass index is 26.83 kg/m.[name] Filed Weights   12/07/23 1342  Weight: 89.7 kg    Current Outpatient Medications  Medication Sig Dispense Refill   acetaminophen  (TYLENOL ) 500 MG tablet Take 500 mg by mouth as needed for moderate pain (pain score 4-6).     ALPRAZolam  (XANAX ) 1 MG tablet Take 1 mg by mouth See admin instructions. Five night a week for anxiety     b complex vitamins capsule Take 1 capsule by mouth in the morning.     cholecalciferol (VITAMIN D3) 25 MCG (1000 UT) tablet Take 1,000 Units by mouth daily.     Emollient  (VASELINE INTENSIVE CARE EX) Apply 1 application  topically at bedtime.     Multiple Vitamin (MULTIVITAMIN WITH MINERALS) TABS tablet Take 1 tablet by mouth daily. 50+     Omega-3 Fatty Acids (FISH OIL) 1200 MG CAPS Take 1,200 mg by mouth at bedtime.     omeprazole  (PRILOSEC ) 20 MG capsule Take 20 mg by mouth daily.     OVER THE COUNTER MEDICATION Take 1 capsule by mouth at bedtime. Beet  + co q 10 with  grape seed     Polyethyl Glycol-Propyl Glycol (SYSTANE) 0.4-0.3 % SOLN Place 1 drop into both eyes daily.     Psyllium (METAMUCIL PO) Take 5 mLs by mouth at bedtime.     sodium chloride  (OCEAN) 0.65 % SOLN nasal spray Place 1 spray into both nostrils at bedtime as needed for congestion (Sleep).     Wheat Dextrin (BENEFIBER DRINK MIX PO) Take 5 mLs by mouth in the morning.     apixaban  (ELIQUIS ) 5 MG TABS tablet Take 1 tablet (5 mg total) by mouth 2 (two) times daily. 60 tablet 4   metoprolol  tartrate (LOPRESSOR ) 25 MG tablet Take 1 tablet (25 mg total) by mouth 2 (two) times daily. 60 tablet 4   No current facility-administered medications for this encounter.    Atrial Fibrillation Management history:  Previous antiarrhythmic drugs: none Previous cardioversions: none Previous ablations: none Anticoagulation history: Eliquis    ROS- All systems are reviewed and negative except as per the HPI  above.  Physical Exam: BP (!) 126/90   Pulse 89   Ht 6' (1.829 m)   Wt 89.7 kg   BMI 26.83 kg/m   GEN: Well nourished, well developed in no acute distress NECK: No JVD; No carotid bruits CARDIAC: Irregularly irregular rate and rhythm, no murmurs, rubs, gallops RESPIRATORY:  Clear to auscultation without rales, wheezing or rhonchi  ABDOMEN: Soft, non-tender, non-distended EXTREMITIES:  No edema; No deformity   EKG today demonstrates  Vent. rate 89 BPM PR interval * ms QRS duration 80 ms QT/QTcB 376/457 ms P-R-T axes * -37 10 Atrial fibrillation with a competing junctional  pacemaker Left axis deviation Cannot rule out Anterior infarct , age undetermined Abnormal ECG When compared with ECG of 28-Nov-2023 22:55, PREVIOUS ECG IS PRESENT  Echo 11/29/23 demonstrated  1. Left ventricular ejection fraction, by estimation, is 60 to 65%. The  left ventricle has normal function. The left ventricle has no regional  wall motion abnormalities. Left ventricular diastolic parameters are  indeterminate.   2. Right ventricular systolic function is normal. The right ventricular  size is normal. There is normal pulmonary artery systolic pressure.   3. Left atrial size was mildly dilated.   4. Right atrial size was mildly dilated.   5. The mitral valve is normal in structure. Trivial mitral valve  regurgitation. No evidence of mitral stenosis.   6. The tricuspid valve is abnormal.   7. The aortic valve is tricuspid. Aortic valve regurgitation is mild. No  aortic stenosis is present.   8. The inferior vena cava is dilated in size with <50% respiratory  variability, suggesting right atrial pressure of 15 mmHg.   ASSESSMENT & PLAN CHA2DS2-VASc Score = 3  The patient's score is based upon: CHF History: 0 HTN History: 1 Diabetes History: 0 Stroke History: 0 Vascular Disease History: 0 Age Score: 2 Gender Score: 0       ASSESSMENT AND PLAN: Persistent Atrial Fibrillation (ICD10:  I48.19) The patient's CHA2DS2-VASc score is 3, indicating a 3.2% annual risk of stroke.    He is currently in Afib. Labs done on 4/29. We discussed the procedure cardioversion to try to convert to NSR. We discussed the risks vs benefits of this procedure and how ultimately we cannot predict whether a patient will have early return of arrhythmia post procedure. After discussion, the patient wishes to proceed with cardioversion. Labs drawn on 4/29. Will revisit snoring / sleep study at next visit. Continue Lopressor  25 mg BID.  Informed Consent   Shared Decision Making/Informed Consent The  risks (stroke, cardiac arrhythmias rarely resulting in the need for a temporary or permanent pacemaker, skin irritation or burns and complications associated with conscious sedation including aspiration, arrhythmia, respiratory failure and death), benefits (restoration of normal sinus rhythm) and alternatives of a direct current cardioversion were explained in detail to Mr. Wiesemann and he agrees to proceed.        Secondary Hypercoagulable State (ICD10:  D68.69) The patient is at significant risk for stroke/thromboembolism based upon his CHA2DS2-VASc Score of 3.  Continue Apixaban  (Eliquis ).   Continue Eliquis  5 mg BID without interruption. No missed doses.     Follow up 2 weeks after DCCV.    Minnie Amber, PA-C  [facility] Clinic [facility] [address] [place], [place] [address] [phone]

## 2023-12-21 NOTE — Progress Notes (Signed)
 Spoke to patient and instructed them to come at 10:30  and to be NPO after 0000. Medications reviewed.   Confirmed that patient will have a ride home and someone to stay with them for 24 hours after the procedure.

## 2023-12-22 ENCOUNTER — Ambulatory Visit (HOSPITAL_BASED_OUTPATIENT_CLINIC_OR_DEPARTMENT_OTHER): Admitting: Anesthesiology

## 2023-12-22 ENCOUNTER — Ambulatory Visit (HOSPITAL_COMMUNITY)
Admission: RE | Admit: 2023-12-22 | Discharge: 2023-12-22 | Disposition: A | Discharge status: Home / Self Care | Attending: Cardiology | Admitting: Cardiology

## 2023-12-22 ENCOUNTER — Encounter (HOSPITAL_COMMUNITY)
Admission: RE | Disposition: A | Discharge status: Home / Self Care | Payer: Self-pay | Source: Home / Self Care | Attending: Cardiology

## 2023-12-22 ENCOUNTER — Ambulatory Visit (HOSPITAL_COMMUNITY): Admitting: Anesthesiology

## 2023-12-22 ENCOUNTER — Encounter (HOSPITAL_COMMUNITY): Payer: Self-pay | Admitting: Cardiology

## 2023-12-22 ENCOUNTER — Other Ambulatory Visit: Payer: Self-pay

## 2023-12-22 DIAGNOSIS — I4891 Unspecified atrial fibrillation: Secondary | ICD-10-CM

## 2023-12-22 DIAGNOSIS — Z79899 Other long term (current) drug therapy: Secondary | ICD-10-CM | POA: Insufficient documentation

## 2023-12-22 DIAGNOSIS — Z87891 Personal history of nicotine dependence: Secondary | ICD-10-CM | POA: Insufficient documentation

## 2023-12-22 DIAGNOSIS — Z7901 Long term (current) use of anticoagulants: Secondary | ICD-10-CM | POA: Insufficient documentation

## 2023-12-22 DIAGNOSIS — I10 Essential (primary) hypertension: Secondary | ICD-10-CM | POA: Insufficient documentation

## 2023-12-22 DIAGNOSIS — I4819 Other persistent atrial fibrillation: Secondary | ICD-10-CM | POA: Insufficient documentation

## 2023-12-22 HISTORY — PX: CARDIOVERSION: EP1203

## 2023-12-22 SURGERY — CARDIOVERSION (CATH LAB)
Anesthesia: General

## 2023-12-22 MED ORDER — SODIUM CHLORIDE 0.9% FLUSH
3.0000 mL | Freq: Two times a day (BID) | INTRAVENOUS | Status: DC
Start: 2023-12-22 — End: 2023-12-22

## 2023-12-22 MED ORDER — SODIUM CHLORIDE 0.9% FLUSH: 3.0000 mL | INTRAVENOUS | Status: DC | PRN

## 2023-12-22 MED ORDER — LIDOCAINE 2% (20 MG/ML) 5 ML SYRINGE
INTRAMUSCULAR | Status: DC | PRN
  Administered 2023-12-22: 20 mg via INTRAVENOUS

## 2023-12-22 MED ORDER — PROPOFOL 10 MG/ML IV BOLUS
INTRAVENOUS | Status: DC | PRN
  Administered 2023-12-22: 60 mg via INTRAVENOUS

## 2023-12-22 SURGICAL SUPPLY — 1 items: PAD DEFIB RADIO PHYSIO CONN (PAD) ×2 IMPLANT

## 2023-12-22 NOTE — Interval H&P Note (Signed)
 History and Physical Interval Note:  12/22/2023 11:46 AM  Spencer [name]  has presented today for surgery, with the diagnosis of AFIB.  The various methods of treatment have been discussed with the patient and family. After consideration of risks, benefits and other options for treatment, the patient has consented to  Procedure(s): CARDIOVERSION (N/A) as a surgical intervention.  The patient's history has been reviewed, patient examined, no change in status, stable for surgery.  I have reviewed the patient's chart and labs.  Questions were answered to the patient's satisfaction.     Cray Monnin

## 2023-12-22 NOTE — CV Procedure (Signed)
   Electrical Cardioversion Procedure Note Spencer [name] [id number] 12-23-1945  Procedure: Electrical Cardioversion Indications:  Atrial Fibrillation  Time Out: Verified patient identification, verified procedure,medications/allergies/relevent history reviewed, required imaging and test results available.  Performed  Procedure Details  The patient signed informed consent.   The patient was NPO past midnight. Has had therapeutic anticoagulation with Eliquis  greater than 3 weeks. The patient denies any interruption of anticoagulation.  Anesthesia was administered by Dr. Harwood Lingo.  Adequate airway was maintained throughout and vital followed per protocol.  He was cardioverted x 1 with 200J of biphasic synchronized energy.  He converted to NSR.  There were no apparent complications.  The patient tolerated the procedure well and had normal neuro status and respiratory status post procedure with vitals stable as recorded elsewhere.     IMPRESSION:  Successful cardioversion of atrial fibrillation   Follow up:  We will arrange follow up with Primary cardiologist.  He will continue on current medical therapy.  The patient advised to continue anticoagulation.  Spencer [name] 12/22/2023, 11:52 AM

## 2023-12-22 NOTE — Anesthesia Preprocedure Evaluation (Signed)
 Anesthesia Evaluation  Patient identified by MRN, date of birth, ID band Patient awake    Reviewed: Allergy & Precautions, NPO status , Patient's Chart, lab work & pertinent test results  Airway Mallampati: II  TM Distance: >3 FB Neck ROM: Full    Dental  (+) Dental Advisory Given   Pulmonary former smoker   breath sounds clear to auscultation       Cardiovascular hypertension, Pt. on home beta blockers and Pt. on medications + dysrhythmias Atrial Fibrillation  Rhythm:Regular Rate:Normal     Neuro/Psych negative neurological ROS     GI/Hepatic Neg liver ROS, Bowel prep,GERD  ,,  Endo/Other  negative endocrine ROS    Renal/GU negative Renal ROS     Musculoskeletal  (+) Arthritis ,    Abdominal   Peds  Hematology negative hematology ROS (+)   Anesthesia Other Findings   Reproductive/Obstetrics                             Anesthesia Physical Anesthesia Plan  ASA: 2  Anesthesia Plan: General   Post-op Pain Management:    Induction: Intravenous  PONV Risk Score and Plan: 2 and Treatment may vary due to age or medical condition  Airway Management Planned: Natural Airway and Nasal Cannula  Additional Equipment:   Intra-op Plan:   Post-operative Plan:   Informed Consent: I have reviewed the patients History and Physical, chart, labs and discussed the procedure including the risks, benefits and alternatives for the proposed anesthesia with the patient or authorized representative who has indicated his/her understanding and acceptance.       Plan Discussed with: CRNA  Anesthesia Plan Comments:        Anesthesia Quick Evaluation

## 2023-12-22 NOTE — Anesthesia Postprocedure Evaluation (Signed)
 Anesthesia Post Note  Patient: Spencer [name]  Procedure(s) Performed: CARDIOVERSION     Patient location during evaluation: PACU Anesthesia Type: General Level of consciousness: awake and alert Pain management: pain level controlled Vital Signs Assessment: post-procedure vital signs reviewed and stable Respiratory status: spontaneous breathing, nonlabored ventilation, respiratory function stable and patient connected to nasal cannula oxygen Cardiovascular status: blood pressure returned to baseline and stable Postop Assessment: no apparent nausea or vomiting Anesthetic complications: no  No notable events documented.  Last Vitals:  Vitals:   12/22/23 1230 12/22/23 1240  BP: (!) 142/94 (!) 151/97  Pulse: 64 68  Resp: 17 16  Temp:    SpO2: 97% 96%    Last Pain:  Vitals:   12/22/23 1230  TempSrc:   PainSc: 0-No pain                 Melvenia Stabs

## 2023-12-22 NOTE — Transfer of Care (Signed)
 Immediate Anesthesia Transfer of Care Note  Patient: Spencer [name]  Procedure(s) Performed: CARDIOVERSION  Patient Location: PACU and Cath Lab  Anesthesia Type:MAC  Level of Consciousness: awake, alert , and oriented  Airway & Oxygen Therapy: Patient Spontanous Breathing and Patient connected to nasal cannula oxygen  Post-op Assessment: Report given to RN and Post -op Vital signs reviewed and stable  Post vital signs: Reviewed and stable  Last Vitals:  Vitals Value Taken Time  BP    Temp    Pulse 94 12/22/23 1138  Resp 18 12/22/23 1138  SpO2 97 % 12/22/23 1138  Vitals shown include unfiled device data.  Last Pain:  Vitals:   12/22/23 1112  TempSrc:   PainSc: 0-No pain         Complications: No notable events documented.

## 2023-12-23 ENCOUNTER — Emergency Department (HOSPITAL_COMMUNITY)

## 2023-12-23 ENCOUNTER — Other Ambulatory Visit: Payer: Self-pay

## 2023-12-23 ENCOUNTER — Telehealth (HOSPITAL_COMMUNITY): Payer: Self-pay | Admitting: *Deleted

## 2023-12-23 ENCOUNTER — Encounter (HOSPITAL_COMMUNITY): Payer: Self-pay

## 2023-12-23 ENCOUNTER — Emergency Department (HOSPITAL_COMMUNITY)
Admission: EM | Admit: 2023-12-23 | Discharge: 2023-12-23 | Disposition: A | Discharge status: Home / Self Care | Attending: Emergency Medicine | Admitting: Emergency Medicine

## 2023-12-23 DIAGNOSIS — E871 Hypo-osmolality and hyponatremia: Secondary | ICD-10-CM | POA: Diagnosis not present

## 2023-12-23 DIAGNOSIS — Z85828 Personal history of other malignant neoplasm of skin: Secondary | ICD-10-CM | POA: Insufficient documentation

## 2023-12-23 DIAGNOSIS — R0602 Shortness of breath: Secondary | ICD-10-CM | POA: Diagnosis not present

## 2023-12-23 DIAGNOSIS — R5383 Other fatigue: Secondary | ICD-10-CM | POA: Insufficient documentation

## 2023-12-23 DIAGNOSIS — I2510 Atherosclerotic heart disease of native coronary artery without angina pectoris: Secondary | ICD-10-CM | POA: Diagnosis not present

## 2023-12-23 DIAGNOSIS — R030 Elevated blood-pressure reading, without diagnosis of hypertension: Secondary | ICD-10-CM | POA: Diagnosis not present

## 2023-12-23 DIAGNOSIS — D72829 Elevated white blood cell count, unspecified: Secondary | ICD-10-CM | POA: Diagnosis not present

## 2023-12-23 DIAGNOSIS — I499 Cardiac arrhythmia, unspecified: Secondary | ICD-10-CM | POA: Diagnosis not present

## 2023-12-23 DIAGNOSIS — Z7901 Long term (current) use of anticoagulants: Secondary | ICD-10-CM | POA: Insufficient documentation

## 2023-12-23 DIAGNOSIS — E876 Hypokalemia: Secondary | ICD-10-CM | POA: Insufficient documentation

## 2023-12-23 DIAGNOSIS — R5381 Other malaise: Secondary | ICD-10-CM | POA: Insufficient documentation

## 2023-12-23 DIAGNOSIS — R918 Other nonspecific abnormal finding of lung field: Secondary | ICD-10-CM | POA: Diagnosis not present

## 2023-12-23 DIAGNOSIS — F17210 Nicotine dependence, cigarettes, uncomplicated: Secondary | ICD-10-CM | POA: Diagnosis not present

## 2023-12-23 DIAGNOSIS — I2699 Other pulmonary embolism without acute cor pulmonale: Secondary | ICD-10-CM | POA: Diagnosis not present

## 2023-12-23 DIAGNOSIS — R42 Dizziness and giddiness: Secondary | ICD-10-CM | POA: Insufficient documentation

## 2023-12-23 DIAGNOSIS — N281 Cyst of kidney, acquired: Secondary | ICD-10-CM | POA: Diagnosis not present

## 2023-12-23 DIAGNOSIS — R251 Tremor, unspecified: Secondary | ICD-10-CM | POA: Insufficient documentation

## 2023-12-23 LAB — BASIC METABOLIC PANEL WITH GFR
Anion gap: 9 (ref 5–15)
BUN: 14 mg/dL (ref 8–23)
CO2: 21 mmol/L — ABNORMAL LOW (ref 22–32)
Calcium: 8.7 mg/dL — ABNORMAL LOW (ref 8.9–10.3)
Chloride: 103 mmol/L (ref 98–111)
Creatinine, Ser: 0.91 mg/dL (ref 0.61–1.24)
GFR, Estimated: >60 mL/min (ref >60)
Glucose, Bld: 114 mg/dL — ABNORMAL HIGH (ref 70–99)
Potassium: 3.4 mmol/L — ABNORMAL LOW (ref 3.5–5.1)
Sodium: 133 mmol/L — ABNORMAL LOW (ref 135–145)

## 2023-12-23 LAB — CBC WITH DIFFERENTIAL/PLATELET
Abs Immature Granulocytes: 0.04 10*3/uL (ref 0.00–0.07)
Basophils Absolute: 0 10*3/uL (ref 0.0–0.1)
Basophils Relative: 0 %
Eosinophils Absolute: 0 10*3/uL (ref 0.0–0.5)
Eosinophils Relative: 0 %
HCT: 39.6 % (ref 39.0–52.0)
Hemoglobin: 14.2 g/dL (ref 13.0–17.0)
Immature Granulocytes: 0 %
Lymphocytes Relative: 3 %
Lymphs Abs: 0.4 10*3/uL — ABNORMAL LOW (ref 0.7–4.0)
MCH: 34 pg (ref 26.0–34.0)
MCHC: 35.9 g/dL (ref 30.0–36.0)
MCV: 94.7 fL (ref 80.0–100.0)
Monocytes Absolute: 0.9 10*3/uL (ref 0.1–1.0)
Monocytes Relative: 8 %
Neutro Abs: 9.5 10*3/uL — ABNORMAL HIGH (ref 1.7–7.7)
Neutrophils Relative %: 89 %
Platelets: 131 10*3/uL — ABNORMAL LOW (ref 150–400)
RBC: 4.18 MIL/uL — ABNORMAL LOW (ref 4.22–5.81)
RDW: 11.8 % (ref 11.5–15.5)
WBC: 10.9 10*3/uL — ABNORMAL HIGH (ref 4.0–10.5)
nRBC: 0 % (ref 0.0–0.2)

## 2023-12-23 LAB — URINALYSIS, ROUTINE W REFLEX MICROSCOPIC
Bacteria, UA: NONE SEEN
Bilirubin Urine: NEGATIVE
Glucose, UA: NEGATIVE mg/dL
Ketones, ur: NEGATIVE mg/dL
Leukocytes,Ua: NEGATIVE
Nitrite: NEGATIVE
Protein, ur: NEGATIVE mg/dL
Specific Gravity, Urine: 1.006 (ref 1.005–1.030)
pH: 7 (ref 5.0–8.0)

## 2023-12-23 LAB — MAGNESIUM: Magnesium: 1.8 mg/dL (ref 1.7–2.4)

## 2023-12-23 LAB — BRAIN NATRIURETIC PEPTIDE: B Natriuretic Peptide: 204 pg/mL — ABNORMAL HIGH (ref 0.0–100.0)

## 2023-12-23 LAB — RESP PANEL BY RT-PCR (RSV, FLU A&B, COVID)  RVPGX2
Influenza A by PCR: NEGATIVE
Influenza B by PCR: NEGATIVE
Resp Syncytial Virus by PCR: NEGATIVE
SARS Coronavirus 2 by RT PCR: NEGATIVE

## 2023-12-23 LAB — TROPONIN I (HIGH SENSITIVITY)
Troponin I (High Sensitivity): 9 ng/L (ref <18)
Troponin I (High Sensitivity): 9 ng/L (ref <18)

## 2023-12-23 MED ORDER — IOHEXOL 350 MG/ML SOLN
75.0000 mL | Freq: Once | INTRAVENOUS | Status: AC | PRN
  Administered 2023-12-23: 75 mL via INTRAVENOUS

## 2023-12-23 NOTE — Discharge Instructions (Addendum)
 Thank you for coming to [facility] Emergency Department. You were seen for shakiness, chills, fatigue. We did an exam, labs, and imaging, and these showed no acute findings.   The CT of the chest did show a small pulmonary nodule in the left upper lobe. Please discuss this with your PCP and likely follow up for a repeat chest CT in 6 months.  Please follow up with your primary care provider within 1 week.   Do not hesitate to return to the ED or call 911 if you experience: -Worsening symptoms -Chest pain, palpitations, shortness of breath -Lightheadedness, passing out -Fevers/chills -Anything else that concerns you

## 2023-12-23 NOTE — ED Triage Notes (Signed)
 Pt reports:  Recent cardioversion yesterday Sent from [facility] R/o blood clot Chills Started today Dizziness Started today SOB Started today

## 2023-12-23 NOTE — ED Notes (Signed)
 Received report from Brett Camera. Pt ambulated to bathroom, pt denies all symptoms now and denies dizziness or shob with ambulation. Ambulated pt approx 60 yards with sats lowest at 89% ra. Stayed around 90% ra for 2 minutes after resting then up to 94% ra. Pt did not have any symptoms and did not appear to have any shob. Color wnl. A/o. Steady gait. Nad.

## 2023-12-23 NOTE — Telephone Encounter (Signed)
 Wife called back after visit to urgent care stating the provider that assessed him was concerned for Pulmonary Embolus. Pt is starting to feel dizzy and intermittent shortness of breath. With above information instructed wife patient should proceed to ER for quickest form of assessment. Pt did have low grade fever at urgent care.

## 2023-12-23 NOTE — ED Provider Notes (Signed)
 [facility] AT [facility] Provider Note  CSN: [id number] Arrival date & time: 12/23/23 1207  Chief Complaint(s) Chills, Dizziness, and Shortness of Breath  HPI TROOPER OLANDER is a 78 y.o. male with past medical history as below, significant for afib on doac, gerd, oa who presents to the ED with complaint of dyspnea, chest tightness, chills, malaise  Sudden onset of symptoms this morning after drinking coffee, persisted until he arrived to ED and has since improved.  He went to urgent care and was told he had abnormal EKG was advised to go to the ER.  I reviewed EKG from urgent care and there are no acute ischemic changes.  He was recommended for a PE/ACS workup  On my assessment patient is asymptomatic.  Feels back to normal.  No hypoxia.  Is compliant with home medications.  No abdominal pain, nausea or vomiting, no chest pain.  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   Fracture    ribs/ right wrist x3/ left ankle/cracked vertebrae-cervical/   GERD (gastroesophageal reflux disease)    Primary localized osteoarthritis of left knee    Primary localized osteoarthritis of right knee    Patient Active Problem List   Diagnosis Date Noted   New onset a-fib (HCC) 11/29/2023   Essential hypertension 11/29/2023   Enteritis 11/29/2023   Ileus (HCC) 11/28/2023   Primary localized osteoarthritis of right knee    Primary localized osteoarthritis of left knee    Rectal bleeding 03/09/2013   Home Medication(s) Prior to Admission medications   Medication Sig Start Date End Date Taking? Authorizing Provider  acetaminophen  (TYLENOL ) 650 MG CR tablet Take 650 mg by mouth every 8 (eight) hours as needed for pain.    [provider]  ALPRAZolam  (XANAX ) 1 MG tablet Take 1 mg by mouth See admin instructions. Five night a week for anxiety. Skips Monday  and Thursday nights    [provider]  apixaban  (ELIQUIS ) 5 MG TABS tablet Take 1 tablet (5 mg total) by mouth 2 (two) times daily. 12/07/23   Nathanel Bal, PA-C  cholecalciferol (VITAMIN D3) 25 MCG (1000 UT) tablet Take 1,000 Units by mouth daily.    [provider]  cyanocobalamin  (VITAMIN B12) 1000 MCG tablet Take 1,000 mcg by mouth daily.    [provider]  famotidine  (PEPCID ) 20 MG tablet Take 20 mg by mouth at bedtime.    [provider]  Magnesium  250 MG CAPS Take 250 mg by mouth at bedtime.    [provider]  metoprolol  tartrate (LOPRESSOR ) 25 MG tablet Take 1 tablet (25 mg total) by mouth 2 (two) times daily. 12/07/23 01/06/24  Nathanel Bal, PA-C  Misc Natural Products (BEET ROOT PO) Take 1 capsule by mouth daily.    [provider]  Multiple Vitamin (MULTIVITAMIN WITH MINERALS) TABS tablet Take 1 tablet by mouth daily. 50+    [provider]  Omega-3 Fatty Acids (FISH OIL) 1200 MG CAPS Take 1,200 mg by mouth at bedtime.    [provider]  omeprazole  (PRILOSEC ) 20 MG capsule Take 20 mg by mouth daily as needed (acid reflux).    [provider]  Polyethyl Glycol-Propyl Glycol (SYSTANE ULTRA PF OP) Place 1 drop into both eyes daily.    [provider]  Psyllium (METAMUCIL PO) Take 1 Dose by mouth daily  with supper.    [provider]  Wheat Dextrin (BENEFIBER DRINK MIX PO) Take 1 Dose by mouth in the morning.    [provider]                                                                                                                                    Past Surgical History Past Surgical History:  Procedure Laterality Date   BASAL CELL CARCINOMA EXCISION     BIOPSY  11/02/2022   Procedure: BIOPSY;  Surgeon: Suzette Espy, MD;  Location: [facility];  Service: Endoscopy;;   CARDIOVERSION N/A 12/22/2023   Procedure: CARDIOVERSION;  Surgeon: Jerryl Morin, DO;  Location: [facility]  INVASIVE CV LAB;  Service: Cardiovascular;  Laterality: N/A;   COLONOSCOPY  07/04/2004   RMR: Diminutive rectal polyps, removed with cold biopsy forceps, otherwise normal/  Minimal left-sided diverticula, the remainder of the colonic mucosa normal. Hyerplastic   COLONOSCOPY N/A 03/27/2013   Dr.Rourk-prominent internal hemorrhoids o/w normal rectum. scattered sigmoid diverticula with associated areas of submucosal petechiae. two 2mm diminutive polypoid lesions opposite the ileocecal valve the remainder of the colonic mucosa appeared normal. bx= tubular adenoma, benign hyperemic colorectal mucosa with focal active colitis   COLONOSCOPY N/A 05/26/2017   Procedure: COLONOSCOPY;  Surgeon: Suzette Espy, MD;  Location: [facility];  Service: Endoscopy;  Laterality: N/A;  7:30am   ESOPHAGOGASTRODUODENOSCOPY (EGD) WITH PROPOFOL  N/A 11/02/2022   Procedure: ESOPHAGOGASTRODUODENOSCOPY (EGD) WITH PROPOFOL ;  Surgeon: Suzette Espy, MD;  Location: [facility];  Service: Endoscopy;  Laterality: N/A;  9:45 am   FOOT SURGERY Left    X 2-otif   FOOT SURGERY Right 2003   HEMORRHOID BANDING     KNEE SURGERY Left 1973   open   OLECRANON BURSECTOMY Right 03/29/2014   Procedure: EXCISION OF RIGHT OLECRANON BURSA;  Surgeon: Pleasant Brilliant, MD;  Location: [facility] ORS;  Service: Orthopedics;  Laterality: Right;   TOTAL KNEE ARTHROPLASTY Left 06/29/2016   Procedure: LEFT TOTAL KNEE ARTHROPLASTY;  Surgeon: Elly Habermann, MD;  Location: [facility] OR;  Service: Orthopedics;  Laterality: Left;   TOTAL KNEE ARTHROPLASTY Right 06/28/2017   Procedure: TOTAL KNEE ARTHROPLASTY;  Surgeon: Elly Habermann, MD;  Location: [facility] OR;  Service: Orthopedics;  Laterality: Right;   Family History Family History  Problem Relation Age of Onset   Heart failure Mother    COPD Sister    Colon cancer Neg Hx     Social History Social History   Tobacco Use   Smoking status: Former    Current packs/day: 0.00    Average packs/day: 0.5 packs/day  for 5.0 years (2.5 ttl pk-yrs)    Types: Cigarettes    Start date: 03/26/1970    Quit date: 03/27/1975    Years since quitting: 48.7   Smokeless tobacco: Former    Quit date: 08/03/1978  Vaping Use   Vaping status: Never Used  Substance  Use Topics   Alcohol use: Yes    Comment: beer daily 2-3   Drug use: No   Allergies Patient has no known allergies.  Review of Systems A thorough review of systems was obtained and all systems are negative except as noted in the HPI and PMH.   Physical Exam Vital Signs  I have reviewed the triage vital signs BP 128/76   Pulse 77   Temp 98.4 F (36.9 C) (Oral)   Resp (!) 23   Wt 83.9 kg   SpO2 93%   BMI 25.09 kg/m  Physical Exam Vitals and nursing note reviewed.  Constitutional:      General: He is not in acute distress.    Appearance: He is well-developed.  HENT:     Head: Normocephalic and atraumatic.     Right Ear: External ear normal.     Left Ear: External ear normal.     Mouth/Throat:     Mouth: Mucous membranes are moist.  Eyes:     General: No scleral icterus. Cardiovascular:     Rate and Rhythm: Normal rate and regular rhythm.     Pulses: Normal pulses.     Heart sounds: Normal heart sounds.  Pulmonary:     Effort: Pulmonary effort is normal. Tachypnea present. No respiratory distress.     Breath sounds: Normal breath sounds.  Abdominal:     General: Abdomen is flat.     Palpations: Abdomen is soft.     Tenderness: There is no abdominal tenderness.  Musculoskeletal:     Cervical back: No rigidity.     Right lower leg: No edema.     Left lower leg: No edema.  Skin:    General: Skin is warm and dry.     Capillary Refill: Capillary refill takes less than 2 seconds.  Neurological:     Mental Status: He is alert.  Psychiatric:        Mood and Affect: Mood normal.        Behavior: Behavior normal.     ED Results and Treatments Labs (all labs ordered are listed, but only abnormal results are displayed) Labs  Reviewed  BASIC METABOLIC PANEL WITH GFR - Abnormal; Notable for the following components:      Result Value   Sodium 133 (*)    Potassium 3.4 (*)    CO2 21 (*)    Glucose, Bld 114 (*)    Calcium 8.7 (*)    All other components within normal limits  CBC WITH DIFFERENTIAL/PLATELET - Abnormal; Notable for the following components:   WBC 10.9 (*)    RBC 4.18 (*)    Platelets 131 (*)    Neutro Abs 9.5 (*)    Lymphs Abs 0.4 (*)    All other components within normal limits  URINALYSIS, ROUTINE W REFLEX MICROSCOPIC - Abnormal; Notable for the following components:   Hgb urine dipstick MODERATE (*)    All other components within normal limits  BRAIN NATRIURETIC PEPTIDE - Abnormal; Notable for the following components:   B Natriuretic Peptide 204.0 (*)    All other components within normal limits  RESP PANEL BY RT-PCR (RSV, FLU A&B, COVID)  RVPGX2  MAGNESIUM   TROPONIN I (HIGH SENSITIVITY)  TROPONIN I (HIGH SENSITIVITY)  Radiology DG Chest 2 View Result Date: 12/23/2023 CLINICAL DATA:  dizziness, sob EXAM: CHEST - 2 VIEW COMPARISON:  June 20, 2009 FINDINGS: No focal airspace consolidation, pleural effusion, or pneumothorax. No cardiomegaly. No acute fracture or destructive lesion. IMPRESSION: No acute cardiopulmonary abnormality. Electronically Signed   By: Rance Burrows M.D.   On: 12/23/2023 14:13    Pertinent labs & imaging results that were available during my care of the patient were reviewed by me and considered in my medical decision making (see MDM for details).  Medications Ordered in ED Medications  iohexol  (OMNIPAQUE ) 350 MG/ML injection 75 mL (75 mLs Intravenous Contrast Given 12/23/23 1442)                                                                                                                                      Procedures Procedures  (including critical care time)  Medical Decision Making / ED Course    Medical Decision Making:    MATTHEWJAMES PETRASEK is a 78 y.o. male with past medical history as below, significant for afib on doac, gerd, oa who presents to the ED with complaint of dyspnea, chest tightness, palpitations, chills, malaise. The complaint involves an extensive differential diagnosis and also carries with it a high risk of complications and morbidity.  Serious etiology was considered. Ddx includes but is not limited to: In my evaluation of this patient's dyspnea my DDx includes, but is not limited to, pneumonia, pulmonary embolism, pneumothorax, pulmonary edema, metabolic acidosis, asthma, COPD, cardiac cause, anemia, anxiety, etc.    Complete initial physical exam performed, notably the patient was in distress no hypoxia.    Reviewed and confirmed nursing documentation for past medical history, family history, social history.  Vital signs reviewed.     Brief summary:  78 year old male history as above including recent cardioversion from A-fib still on DOAC here with dyspnea, chest tightness, palpitations, malaise Symptoms resolved on arrival Workup so far is reassuring.  BNP is mildly elevated.  Chest x-ray is stable.  CT PE is pending Handoff incoming EDP pending CT and reassess                  Additional history obtained: -Additional history obtained from family -External records from outside source obtained and reviewed including: Chart review including previous notes, labs, imaging, consultation notes including  Cardiology documentation Prior labs Home meds   Lab Tests: -I ordered, reviewed, and interpreted labs.   The pertinent results include:   Labs Reviewed  BASIC METABOLIC PANEL WITH GFR - Abnormal; Notable for the following components:      Result Value   Sodium 133 (*)    Potassium 3.4 (*)    CO2 21 (*)    Glucose, Bld 114 (*)    Calcium 8.7  (*)    All other components within normal limits  CBC WITH DIFFERENTIAL/PLATELET - Abnormal; Notable for the following components:  WBC 10.9 (*)    RBC 4.18 (*)    Platelets 131 (*)    Neutro Abs 9.5 (*)    Lymphs Abs 0.4 (*)    All other components within normal limits  URINALYSIS, ROUTINE W REFLEX MICROSCOPIC - Abnormal; Notable for the following components:   Hgb urine dipstick MODERATE (*)    All other components within normal limits  BRAIN NATRIURETIC PEPTIDE - Abnormal; Notable for the following components:   B Natriuretic Peptide 204.0 (*)    All other components within normal limits  RESP PANEL BY RT-PCR (RSV, FLU A&B, COVID)  RVPGX2  MAGNESIUM   TROPONIN I (HIGH SENSITIVITY)  TROPONIN I (HIGH SENSITIVITY)    Notable for labs stable  EKG   EKG Interpretation Date/Time:    Ventricular Rate:    PR Interval:    QRS Duration:    QT Interval:    QTC Calculation:   R Axis:      Text Interpretation:           Imaging Studies ordered: I ordered imaging studies including CXR CTPE >CT PENDING I agree with the radiologist interpretation If any imaging was obtained with contrast I closely monitored patient for any possible adverse reaction a/w contrast administration in the emergency department   Medicines ordered and prescription drug management: Meds ordered this encounter  Medications   iohexol  (OMNIPAQUE ) 350 MG/ML injection 75 mL    -I have reviewed the patients home medicines and have made adjustments as needed   Consultations Obtained: na   Cardiac Monitoring: The patient was maintained on a cardiac monitor.  I personally viewed and interpreted the cardiac monitored which showed an underlying rhythm of: nsr Continuous pulse oximetry interpreted by myself, 97% on RA.    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    Final Clinical Impression(s) / ED Diagnoses Final diagnoses:  None        Teddi Favors, DO 12/23/23 1516

## 2023-12-23 NOTE — ED Notes (Signed)
 Pt A/Ox4. Wife at bedside. VSS. Says he is feeling better.

## 2023-12-23 NOTE — ED Notes (Signed)
Pt taken to ct. Nad.  

## 2023-12-23 NOTE — ED Provider Notes (Signed)
 3:09 PM Assumed care of patient from off-going team. For more details, please see note from same day.  In brief, this is a 78 y.o. male w/ recent cardioversion for Afib. This AM sudden palpitations, unwell, chills, chest pain. On eliquis . Sent for PE rule out by cardiology urgent care. EKG okay. On RA.   Plan/Dispo at time of sign-out & ED Course since sign-out: [ ]  CT PE  BP 128/76   Pulse 77   Temp 98.4 F (36.9 C) (Oral)   Resp (!) 23   Wt 83.9 kg   SpO2 93%   BMI 25.09 kg/m    ED Course:   Clinical Course as of 12/23/23 1712  Thu Dec 23, 2023  1641 CT Angio Chest PE W and/or Wo Contrast 1. No evidence of pulmonary embolism to the segmental level. 2. Coronary artery atherosclerotic vascular disease. 3. Left upper lobe ground-glass nodule measuring 1.3 cm. Initial follow-up with CT at 6 months is recommended to confirm persistence. If persistent, repeat CT is recommended every 2 years until 5 years of stability has been established. This recommendation follows the consensus statement: Guidelines for Management of Incidental Pulmonary Nodules Detected on CT Images: From the Fleischner Society 2017; Radiology 2017; 284:228-243.   [HN]  1711 Patient reevaluated his symptoms of completely resolved.  His workup is very reassuring.  Troponin is negative, EKG is within normal limits and no A-fib.  CT chest demonstrates left upper lobe nodule and patient and his wife are informed of this finding.  No PE or pneumonia.  Patient is overall very well-appearing and hemodynamically stable.  He has follow-up with cardiology planned for Tuesday.  Given discharge instructions and return precautions, all questions answered to patient and his wife satisfaction. [HN]    Clinical Course User Index [HN] Merdis Stalling, MD    Dispo: DC ------------------------------- Annita Kindle, MD Emergency Medicine  This note was created using dictation software, which may contain spelling or grammatical  errors.   Merdis Stalling, MD 12/23/23 [phone]

## 2023-12-23 NOTE — Telephone Encounter (Signed)
 Patient wife called in stating patient developed significant chills about an hour ago. He is currently wrapped up multiple blankets. Denies cough, chest pain/tightness, urinary symptoms overall feels "great" other than the chills. Temporal temperature reading is 96.4. Does endorse slight shortness of breath. Discussed with Caesar Caster PA recommended urgent PCP assessment or urgent care. Pt wife verbalized understanding.

## 2024-01-06 ENCOUNTER — Ambulatory Visit (HOSPITAL_COMMUNITY)
Admission: RE | Admit: 2024-01-06 | Discharge: 2024-01-06 | Disposition: A | Discharge status: Home / Self Care | Source: Ambulatory Visit | Attending: Internal Medicine | Admitting: Internal Medicine

## 2024-01-06 VITALS — BP 122/84 | HR 95 | Ht 72.0 in | Wt 195.4 lb

## 2024-01-06 DIAGNOSIS — D6869 Other thrombophilia: Secondary | ICD-10-CM

## 2024-01-06 DIAGNOSIS — I4819 Other persistent atrial fibrillation: Secondary | ICD-10-CM | POA: Diagnosis not present

## 2024-01-06 DIAGNOSIS — I4891 Unspecified atrial fibrillation: Secondary | ICD-10-CM

## 2024-01-06 NOTE — Progress Notes (Signed)
 Primary Care Physician: Omie Bickers, MD Primary Cardiologist: None Electrophysiologist: None     Referring Physician: Albertus Alt, MD   Spencer [name] is a 78 y.o. male with a history of HTN, aortic and coronary artery atherosclerosis by CT imaging, GERD, Barrett's esophagus, remote history of GI bleed, and atrial fibrillation who presents for consultation in the [facility]. Hospital admission 4/27-29/2025 for abdominal pain likely enteritis found to be in new Afib. Patient is on Eliquis  for a CHADS2VASC score of 4.  On evaluation today, he is currently in Afib. He is taking Lopressor  25 mg BID. No bleeding issues on Eliquis  5 mg BID. He is very active with exercise 4 days a week, golf 2 days a week, and fishing on Saturday. He maybe has felt more tired since diagnosis. He notes to have two alcoholic beverages daily, typically one beer and one small spirit. He does note unfortunately son passed away in 02-03-18 and has been very difficult to deal with.   On follow up 01/06/24, he is currently in Afib. S/p successful DCCV on 12/22/23. He went to ED on 5/22 with concern for shaking, chills, and SOB. Negative workup for PE found to be in normal rhythm. He does not have cardiac awareness. No missed doses of Eliquis .   Today, he denies symptoms of palpitations, chest pain, shortness of breath, orthopnea, PND, lower extremity edema, dizziness, presyncope, syncope, snoring, daytime somnolence, bleeding, or neurologic sequela. The patient is tolerating medications without difficulties and is otherwise without complaint today.    Atrial Fibrillation Risk Factors:  he does have symptoms or diagnosis of sleep apnea.   he has a BMI of Body mass index is 26.5 kg/m.[name] Filed Weights   01/06/24 1419  Weight: 88.6 kg     Current Outpatient Medications  Medication Sig Dispense Refill   acetaminophen  (TYLENOL ) 650 MG CR tablet Take 650 mg by mouth as needed for  pain.     ALPRAZolam  (XANAX ) 1 MG tablet Take 1 mg by mouth See admin instructions. Five night a week for anxiety. Skips Feb 04, 2024 and Thursday nights     apixaban  (ELIQUIS ) 5 MG TABS tablet Take 1 tablet (5 mg total) by mouth 2 (two) times daily. 60 tablet 4   cholecalciferol (VITAMIN D3) 25 MCG (1000 UT) tablet Take 1,000 Units by mouth daily.     cyanocobalamin  (VITAMIN B12) 1000 MCG tablet Take 1,000 mcg by mouth daily.     famotidine  (PEPCID ) 20 MG tablet Take 20 mg by mouth at bedtime.     Magnesium  250 MG CAPS Take 250 mg by mouth at bedtime.     metoprolol  tartrate (LOPRESSOR ) 25 MG tablet Take 1 tablet (25 mg total) by mouth 2 (two) times daily. 60 tablet 4   Misc Natural Products (BEET ROOT PO) Take 1 capsule by mouth daily.     Multiple Vitamin (MULTIVITAMIN WITH MINERALS) TABS tablet Take 1 tablet by mouth daily. 50+     Omega-3 Fatty Acids (FISH OIL) 1200 MG CAPS Take 1,200 mg by mouth at bedtime.     omeprazole  (PRILOSEC ) 20 MG capsule Take 20 mg by mouth daily as needed (acid reflux).     Polyethyl Glycol-Propyl Glycol (SYSTANE ULTRA PF OP) Place 1 drop into both eyes daily.     Psyllium (METAMUCIL PO) Take 1 Dose by mouth daily with supper.     Wheat Dextrin (BENEFIBER DRINK MIX PO) Take 1 Dose by mouth in the morning.  No current facility-administered medications for this encounter.    Atrial Fibrillation Management history:  Previous antiarrhythmic drugs: none Previous cardioversions: 12/22/23 Previous ablations: none Anticoagulation history: Eliquis    ROS- All systems are reviewed and negative except as per the HPI above.  Physical Exam: BP 122/84   Pulse 95   Ht 6' (1.829 m)   Wt 88.6 kg   BMI 26.50 kg/m   GEN- The patient is well appearing, alert and oriented x 3 today.   Neck - no JVD or carotid bruit noted Lungs- Clear to ausculation bilaterally, normal work of breathing Heart- Irregular rate and rhythm, no murmurs, rubs or gallops, PMI not laterally  displaced Extremities- no clubbing, cyanosis, or edema Skin - no rash or ecchymosis noted   EKG today demonstrates  Vent. rate 95 BPM PR interval * ms QRS duration 82 ms QT/QTcB 386/485 ms P-R-T axes * -38 22 Atrial fibrillation Left axis deviation Cannot rule out Anterior infarct , age undetermined Abnormal ECG When compared with ECG of 23-Dec-2023 16:01, PREVIOUS ECG IS PRESENT  Echo 11/29/23 demonstrated  1. Left ventricular ejection fraction, by estimation, is 60 to 65%. The  left ventricle has normal function. The left ventricle has no regional  wall motion abnormalities. Left ventricular diastolic parameters are  indeterminate.   2. Right ventricular systolic function is normal. The right ventricular  size is normal. There is normal pulmonary artery systolic pressure.   3. Left atrial size was mildly dilated.   4. Right atrial size was mildly dilated.   5. The mitral valve is normal in structure. Trivial mitral valve  regurgitation. No evidence of mitral stenosis.   6. The tricuspid valve is abnormal.   7. The aortic valve is tricuspid. Aortic valve regurgitation is mild. No  aortic stenosis is present.   8. The inferior vena cava is dilated in size with <50% respiratory  variability, suggesting right atrial pressure of 15 mmHg.   CHA2DS2-VASc Score = 4  The patient's score is based upon: CHF History: 0 HTN History: 1 Diabetes History: 0 Stroke History: 0 Vascular Disease History: 1 Age Score: 2 Gender Score: 0       ASSESSMENT AND PLAN: Persistent Atrial Fibrillation (ICD10:  I48.19) The patient's CHA2DS2-VASc score is 4, indicating a 4.8% annual risk of stroke.   S/p DCCV on 12/22/23.  He is currently in Afib. We had a long discussion about medication treatments and ablation in detail. We discussed potential options such as Multaq and amiodarone. I would not consider Tikosyn due to prolonged QT interval at baseline. I would not consider flecainide due to  multi-vessel atherosclerosis on CT imaging. We talked about the monitoring required for these medications, potential adverse effects, and follow up visits. We also discussed pulse field ablation as an option in the form of intervention. After discussion, patient is interested in pursuing sleep study. We discussed potential triggers for Afib including excess caffeine, alcohol, and sleep apnea. He admits that wife states he may stop breathing at night. Will order sleep study. He defers AAD therapy and ablation currently; wishes to try sleep study first.   PR 220 ms Qtc in NSR 465 ms   Secondary Hypercoagulable State (ICD10:  D68.69) The patient is at significant risk for stroke/thromboembolism based upon his CHA2DS2-VASc Score of 4.  Continue Apixaban  (Eliquis ).   No missed doses.     Follow up 2 months Afib clinic.    Minnie Amber, PA-C  Afib Clinic [facility] [address]  [address] [place], [place] [address] [phone]

## 2024-01-06 NOTE — Patient Instructions (Signed)
 Sleep study -- scheduling will call once insurance authorization received.

## 2024-01-20 ENCOUNTER — Encounter: Payer: Self-pay | Admitting: Internal Medicine

## 2024-01-24 DIAGNOSIS — L821 Other seborrheic keratosis: Secondary | ICD-10-CM | POA: Diagnosis not present

## 2024-01-24 DIAGNOSIS — L570 Actinic keratosis: Secondary | ICD-10-CM | POA: Diagnosis not present

## 2024-01-24 DIAGNOSIS — Z85828 Personal history of other malignant neoplasm of skin: Secondary | ICD-10-CM | POA: Diagnosis not present

## 2024-01-24 DIAGNOSIS — L918 Other hypertrophic disorders of the skin: Secondary | ICD-10-CM | POA: Diagnosis not present

## 2024-01-24 DIAGNOSIS — L820 Inflamed seborrheic keratosis: Secondary | ICD-10-CM | POA: Diagnosis not present

## 2024-01-31 DIAGNOSIS — R0683 Snoring: Secondary | ICD-10-CM | POA: Diagnosis not present

## 2024-02-15 ENCOUNTER — Ambulatory Visit: Admitting: Internal Medicine

## 2024-02-18 ENCOUNTER — Ambulatory Visit: Admitting: Internal Medicine

## 2024-02-18 VITALS — BP 130/80 | HR 96 | Temp 98.6°F | Ht 72.0 in | Wt 196.0 lb

## 2024-02-18 DIAGNOSIS — Z860101 Personal history of adenomatous and serrated colon polyps: Secondary | ICD-10-CM | POA: Diagnosis not present

## 2024-02-18 DIAGNOSIS — K219 Gastro-esophageal reflux disease without esophagitis: Secondary | ICD-10-CM | POA: Diagnosis not present

## 2024-02-18 DIAGNOSIS — K2270 Barrett's esophagus without dysplasia: Secondary | ICD-10-CM

## 2024-02-18 DIAGNOSIS — K2100 Gastro-esophageal reflux disease with esophagitis, without bleeding: Secondary | ICD-10-CM

## 2024-02-18 DIAGNOSIS — K529 Noninfective gastroenteritis and colitis, unspecified: Secondary | ICD-10-CM

## 2024-02-18 NOTE — Progress Notes (Signed)
 Primary Care Physician:  Shona Norleen PEDLAR, MD Primary Gastroenterologist:  Dr. Shaaron  Pre-Procedure History & Physical: HPI:  Spencer [name] is a 78 y.o. 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  Got sick 2 months ago after eating a large bowl of [place] chili at the [employer].  He was challenged to eat the whole thing.  He did.  Within 24 hours he developed abdominal distention and cramps ED evaluation demonstrated focal enteritis with possible small bowel obstruction.  Symptoms resolved with conservative measures.  No recurrent symptoms; he has no lower GI tract symptoms at this time.  He has been on omeprazole  20 mg daily off and on for years -  tends not to take it regularly also Pepcid  AC.  We discussed again the entity of Barrett's esophagus and need for substantial acid suppression daily.  Risk of cancer is low but it is increased compared to the non-Barrett's population.   Distant history of colonic adenoma we will discuss 1 more surveillance colonoscopy.  New diagnosis of atrial fibrillation on Eliquis  -added since he was last here.  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   Fracture    ribs/ right wrist x3/ left ankle/cracked vertebrae-cervical/   GERD (gastroesophageal reflux disease)    Primary localized osteoarthritis of left knee    Primary localized osteoarthritis of right knee     Past Surgical History:  Procedure Laterality Date   BASAL CELL CARCINOMA EXCISION     BIOPSY  11/02/2022   Procedure: BIOPSY;  Surgeon: Shaaron Lamar HERO, MD;  Location: [facility];  Service: Endoscopy;;   CARDIOVERSION N/A 12/22/2023   Procedure: CARDIOVERSION;  Surgeon: Sheena Pugh, DO;  Location: [facility];  Service: Cardiovascular;  Laterality: N/A;   COLONOSCOPY  07/04/2004   RMR: Diminutive rectal polyps, removed with cold biopsy forceps,  otherwise normal/  Minimal left-sided diverticula, the remainder of the colonic mucosa normal. Hyerplastic   COLONOSCOPY N/A 03/27/2013   Dr.Jkai Arwood-prominent internal hemorrhoids o/w normal rectum. scattered sigmoid diverticula with associated areas of submucosal petechiae. two 2mm diminutive polypoid lesions opposite the ileocecal valve the remainder of the colonic mucosa appeared normal. bx= tubular adenoma, benign hyperemic colorectal mucosa with focal active colitis   COLONOSCOPY N/A 05/26/2017   Procedure: COLONOSCOPY;  Surgeon: Shaaron Lamar HERO, MD;  Location: [facility];  Service: Endoscopy;  Laterality: N/A;  7:30am   ESOPHAGOGASTRODUODENOSCOPY (EGD) WITH PROPOFOL  N/A 11/02/2022   Procedure: ESOPHAGOGASTRODUODENOSCOPY (EGD) WITH PROPOFOL ;  Surgeon: Shaaron Lamar HERO, MD;  Location: [facility];  Service: Endoscopy;  Laterality: N/A;  9:45 am   FOOT SURGERY Left    X 2-otif   FOOT SURGERY Right 2003   HEMORRHOID BANDING     KNEE SURGERY Left 1973   open   OLECRANON BURSECTOMY Right 03/29/2014   Procedure: EXCISION OF RIGHT OLECRANON BURSA;  Surgeon: Lemond Stable, MD;  Location: [facility];  Service: Orthopedics;  Laterality: Right;   TOTAL KNEE ARTHROPLASTY Left 06/29/2016   Procedure: LEFT TOTAL KNEE ARTHROPLASTY;  Surgeon: Lamar Millman, MD;  Location: [facility] OR;  Service: Orthopedics;  Laterality: Left;   TOTAL KNEE ARTHROPLASTY Right 06/28/2017   Procedure: TOTAL KNEE ARTHROPLASTY;  Surgeon: Millman Lamar, MD;  Location: [facility] OR;  Service: Orthopedics;  Laterality: Right;    Prior to Admission medications   Medication Sig Start Date End Date Taking?  Authorizing Provider  acetaminophen  (TYLENOL ) 650 MG CR tablet Take 650 mg by mouth as needed for pain.   Yes [provider]  ALPRAZolam  (XANAX ) 1 MG tablet Take 1 mg by mouth See admin instructions. Five night a week for anxiety. Skips Monday and Thursday nights   Yes [provider]  apixaban  (ELIQUIS ) 5 MG TABS tablet Take 1  tablet (5 mg total) by mouth 2 (two) times daily. 12/07/23  Yes Terra Fairy PARAS, PA-C  cholecalciferol (VITAMIN D3) 25 MCG (1000 UT) tablet Take 1,000 Units by mouth daily.   Yes [provider]  cyanocobalamin  (VITAMIN B12) 1000 MCG tablet Take 1,000 mcg by mouth daily.   Yes [provider]  famotidine  (PEPCID ) 20 MG tablet Take 20 mg by mouth at bedtime.   Yes [provider]  Magnesium  250 MG CAPS Take 250 mg by mouth at bedtime.   Yes [provider]  metoprolol  tartrate (LOPRESSOR ) 25 MG tablet Take 1 tablet (25 mg total) by mouth 2 (two) times daily. 12/07/23 02/18/24 Yes Terra Fairy PARAS, PA-C  Misc Natural Products (BEET ROOT PO) Take 1 capsule by mouth daily.   Yes [provider]  Multiple Vitamin (MULTIVITAMIN WITH MINERALS) TABS tablet Take 1 tablet by mouth daily. 50+   Yes [provider]  Omega-3 Fatty Acids (FISH OIL) 1200 MG CAPS Take 1,200 mg by mouth at bedtime.   Yes [provider]  omeprazole  (PRILOSEC ) 20 MG capsule Take 20 mg by mouth daily as needed (acid reflux).   Yes [provider]  Polyethyl Glycol-Propyl Glycol (SYSTANE ULTRA PF OP) Place 1 drop into both eyes daily.   Yes [provider]  Psyllium (METAMUCIL PO) Take 1 Dose by mouth daily with supper.   Yes [provider]  Wheat Dextrin (BENEFIBER DRINK MIX PO) Take 1 Dose by mouth in the morning.   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 [employer]  Tobacco Use   Smoking status: Former     Current packs/day: 0.00    Average packs/day: 0.5 packs/day for 5.0 years (2.5 ttl pk-yrs)    Types: Cigarettes    Start date: 03/26/1970    Quit date: 03/27/1975    Years since quitting: 48.9   Smokeless tobacco: Former    Quit date: 08/03/1978  Vaping Use   Vaping status: Never Used  Substance and Sexual Activity   Alcohol use: Yes    Comment: beer daily 2-3   Drug use: No   Sexual activity: Yes    Birth control/protection: None  Other Topics Concern   Not on file  Social History Narrative   Not on file   Social Drivers of Health   Financial Resource Strain: Not on file  Food Insecurity: No Food Insecurity (11/29/2023)   Hunger Vital Sign    Worried About Running Out of Food in the Last Year: Never true    Ran  Out of Food in the Last Year: Never true  Transportation Needs: No Transportation Needs (11/29/2023)   PRAPARE - Administrator, Civil Service (Medical): No    Lack of Transportation (Non-Medical): No  Physical Activity: Not on file  Stress: Not on file  Social Connections: Socially Integrated (11/29/2023)   Social Connection and Isolation Panel    Frequency of Communication with Friends and Family: More than three times a week    Frequency of Social Gatherings with Friends and Family: Three times a week    Attends Religious Services: More than 4 times per year    Active Member of Clubs or Organizations: Yes    Attends Banker Meetings: More than 4 times per year    Marital Status: Married  Catering manager Violence: Not At Risk (11/29/2023)   Humiliation, Afraid, Rape, and Kick questionnaire    Fear of Current or Ex-Partner: No    Emotionally Abused: No    Physically Abused: No    Sexually Abused: No    Review of Systems: See HPI, otherwise negative ROS  Physical Exam: BP 130/80   Pulse 96   Temp 98.6 F (37 C)   Ht 6' (1.829 m)   Wt 196 lb (88.9 kg)   BMI 26.58 kg/m  General:   Alert,  Well-developed, well-nourished, pleasant and  cooperative in NAD Neck:  Supple; no masses or thyromegaly. No significant cervical adenopathy. Lungs:  Clear throughout to auscultation.   No wheezes, crackles, or rhonchi. No acute distress. Heart:  Regular rate and rhythm; no murmurs, clicks, rubs,  or gallops. Abdomen: Non-distended, normal bowel sounds.  Soft and nontender without appreciable mass or hepatosplenomegaly.   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  Asymptomatic.  His antireflux regimen is somewhat suboptimal.  We talked about going on a little higher dose PPI once daily with H2 blocker use to supplement as needed.  We talked about surveillance as recommended.  Distant history of colonic adenoma.  Will consider 1 more surveillance examination in 2 years with a EGD.  Recommendations:  For more optimal control of acid reflux in the setting of Barrett's esophagus, we are changing PPI from omeprazole  to pantoprazole  or Protonix .  New prescription for pantoprazole  Protonix  40 mg pill dispense 30 take 1 daily 30 minutes for breakfast.  11 refills.  Stop omeprazole .  May use Pepcid  AC as needed  GERD information provided  Office visit  in 1 year  As discussed, we will plan for a surveillance colonoscopy and EGD at the same time in 2027  If any interim problems, pt to reach out.    Notice: This dictation was prepared with Dragon dictation along with smaller phrase technology. Any transcriptional errors that result from this process are unintentional and may not be corrected upon review.

## 2024-02-18 NOTE — Patient Instructions (Signed)
 It was good to see you again today!  For more optimal control of acid reflux in the setting of Barrett's esophagus, we are changing your PPI from omeprazole  to pantoprazole  or Protonix .  New prescription for pantoprazole  Protonix  40 mg pill dispense 30 take 1 daily 30 minutes for breakfast.  11 refills.  Stop omeprazole .  May use Pepcid  AC as needed  GERD information provided  Office visit with us  in 1 year  As discussed, we will plan for a surveillance colonoscopy and EGD at the same time in 2027  Office visit with me in 1 year  If you have any interim problems please do not hesitate to call

## 2024-02-24 ENCOUNTER — Other Ambulatory Visit: Payer: Self-pay

## 2024-02-24 MED ORDER — PANTOPRAZOLE SODIUM 40 MG PO TBEC: 40.0000 mg | DELAYED_RELEASE_TABLET | Freq: Every day | ORAL | 11 refills | Status: AC

## 2024-03-06 DIAGNOSIS — R0683 Snoring: Secondary | ICD-10-CM | POA: Diagnosis not present

## 2024-03-06 DIAGNOSIS — I480 Paroxysmal atrial fibrillation: Secondary | ICD-10-CM | POA: Diagnosis not present

## 2024-03-29 DIAGNOSIS — G4733 Obstructive sleep apnea (adult) (pediatric): Secondary | ICD-10-CM | POA: Diagnosis not present

## 2024-04-10 ENCOUNTER — Encounter (HOSPITAL_COMMUNITY): Payer: Self-pay | Admitting: Internal Medicine

## 2024-04-10 ENCOUNTER — Ambulatory Visit (HOSPITAL_COMMUNITY)
Admission: RE | Admit: 2024-04-10 | Discharge: 2024-04-10 | Disposition: A | Discharge status: Home / Self Care | Source: Ambulatory Visit | Attending: Internal Medicine | Admitting: Internal Medicine

## 2024-04-10 ENCOUNTER — Other Ambulatory Visit (HOSPITAL_COMMUNITY): Payer: Self-pay | Admitting: Nurse Practitioner

## 2024-04-10 VITALS — BP 136/94 | HR 93 | Ht 72.0 in | Wt 197.6 lb

## 2024-04-10 DIAGNOSIS — I4891 Unspecified atrial fibrillation: Secondary | ICD-10-CM | POA: Diagnosis not present

## 2024-04-10 DIAGNOSIS — K2270 Barrett's esophagus without dysplasia: Secondary | ICD-10-CM | POA: Diagnosis not present

## 2024-04-10 DIAGNOSIS — I4819 Other persistent atrial fibrillation: Secondary | ICD-10-CM | POA: Diagnosis not present

## 2024-04-10 DIAGNOSIS — D6869 Other thrombophilia: Secondary | ICD-10-CM | POA: Diagnosis not present

## 2024-04-10 DIAGNOSIS — F411 Generalized anxiety disorder: Secondary | ICD-10-CM | POA: Diagnosis not present

## 2024-04-10 DIAGNOSIS — R911 Solitary pulmonary nodule: Secondary | ICD-10-CM | POA: Diagnosis not present

## 2024-04-10 DIAGNOSIS — Z79899 Other long term (current) drug therapy: Secondary | ICD-10-CM | POA: Diagnosis not present

## 2024-04-10 DIAGNOSIS — I10 Essential (primary) hypertension: Secondary | ICD-10-CM | POA: Diagnosis not present

## 2024-04-10 NOTE — Progress Notes (Signed)
 Primary Care Physician: Shona Norleen PEDLAR, MD Primary Cardiologist: None Electrophysiologist: None     Referring Physician: Noralee Elidia Sieving, MD   Spencer [name] is a 78 y.o. male with a history of HTN, aortic and coronary artery atherosclerosis by CT imaging, GERD, Barrett's esophagus, remote history of GI bleed, and atrial fibrillation who presents for consultation in the [facility]. Hospital admission 4/27-29/2025 for abdominal pain likely enteritis found to be in new Afib. Patient is on Eliquis  for a CHADS2VASC score of 4.  On evaluation today, he is currently in Afib. He is taking Lopressor  25 mg BID. No bleeding issues on Eliquis  5 mg BID. He is very active with exercise 4 days a week, golf 2 days a week, and fishing on Saturday. He maybe has felt more tired since diagnosis. He notes to have two alcoholic beverages daily, typically one beer and one small spirit. He does note unfortunately son passed away in 04/20/18 and has been very difficult to deal with.   On follow up 01/06/24, he is currently in Afib. S/p successful DCCV on 12/22/23. He went to ED on 5/22 with concern for shaking, chills, and SOB. Negative workup for PE found to be in normal rhythm. He does not have cardiac awareness. No missed doses of Eliquis .   On follow up 04/10/24, patient is currently in Afib. Seen by sleep medicine and waiting on sleep study results. He notes to feel tired when out of rhythm. He has stopped caffeine. He has reduced alcohol intake to one beer daily. No missed doses of Eliquis .   Today, he denies symptoms of palpitations, chest pain, shortness of breath, orthopnea, PND, lower extremity edema, dizziness, presyncope, syncope, snoring, daytime somnolence, bleeding, or neurologic sequela. The patient is tolerating medications without difficulties and is otherwise without complaint today.    Atrial Fibrillation Risk Factors:  he does have symptoms or diagnosis of sleep  apnea.   he has a BMI of Body mass index is 26.8 kg/m.SABRA Filed Weights   04/10/24 1431  Weight: 89.6 kg    Current Outpatient Medications  Medication Sig Dispense Refill   acetaminophen  (TYLENOL ) 650 MG CR tablet Take 650 mg by mouth as needed for pain.     ALPRAZolam  (XANAX ) 1 MG tablet Take 1 mg by mouth See admin instructions. Five night a week for anxiety. Skips Monday and Thursday nights     apixaban  (ELIQUIS ) 5 MG TABS tablet Take 1 tablet (5 mg total) by mouth 2 (two) times daily. 60 tablet 4   cholecalciferol (VITAMIN D3) 25 MCG (1000 UT) tablet Take 1,000 Units by mouth daily.     cyanocobalamin  (VITAMIN B12) 1000 MCG tablet Take 1,000 mcg by mouth daily.     famotidine  (PEPCID ) 20 MG tablet Take 20 mg by mouth at bedtime.     Magnesium  250 MG CAPS Take 250 mg by mouth at bedtime.     metoprolol  tartrate (LOPRESSOR ) 25 MG tablet Take 1 tablet (25 mg total) by mouth 2 (two) times daily. 60 tablet 4   Misc Natural Products (BEET ROOT PO) Take 1 capsule by mouth daily.     Multiple Vitamin (MULTIVITAMIN WITH MINERALS) TABS tablet Take 1 tablet by mouth daily. 50+     Omega-3 Fatty Acids (FISH OIL) 1200 MG CAPS Take 1,200 mg by mouth at bedtime.     omeprazole  (PRILOSEC ) 20 MG capsule Take 20 mg by mouth daily as needed (acid reflux).     pantoprazole  (  PROTONIX ) 40 MG tablet Take 1 tablet (40 mg total) by mouth daily. 30 tablet 11   Polyethyl Glycol-Propyl Glycol (SYSTANE ULTRA PF OP) Place 1 drop into both eyes daily.     Psyllium (METAMUCIL PO) Take 1 Dose by mouth daily with supper.     Wheat Dextrin (BENEFIBER DRINK MIX PO) Take 1 Dose by mouth in the morning.     No current facility-administered medications for this encounter.    Atrial Fibrillation Management history:  Previous antiarrhythmic drugs: none Previous cardioversions: 12/22/23 Previous ablations: none Anticoagulation history: Eliquis    ROS- All systems are reviewed and negative except as per the HPI  above.  Physical Exam: BP (!) 136/94   Pulse 93   Ht 6' (1.829 m)   Wt 89.6 kg   BMI 26.80 kg/m   GEN- The patient is well appearing, alert and oriented x 3 today.   Neck - no JVD or carotid bruit noted Lungs- Clear to ausculation bilaterally, normal work of breathing Heart- Irregular rate and rhythm, no murmurs, rubs or gallops, PMI not laterally displaced Extremities- no clubbing, cyanosis, or edema Skin - no rash or ecchymosis noted   EKG today demonstrates  Vent. rate 93 BPM PR interval * ms QRS duration 78 ms QT/QTcB 358/445 ms P-R-T axes * -22 7 Atrial fibrillation Abnormal ECG When compared with ECG of 06-Jan-2024 14:39, No significant change was found  Echo 11/29/23 demonstrated  1. Left ventricular ejection fraction, by estimation, is 60 to 65%. The  left ventricle has normal function. The left ventricle has no regional  wall motion abnormalities. Left ventricular diastolic parameters are  indeterminate.   2. Right ventricular systolic function is normal. The right ventricular  size is normal. There is normal pulmonary artery systolic pressure.   3. Left atrial size was mildly dilated.   4. Right atrial size was mildly dilated.   5. The mitral valve is normal in structure. Trivial mitral valve  regurgitation. No evidence of mitral stenosis.   6. The tricuspid valve is abnormal.   7. The aortic valve is tricuspid. Aortic valve regurgitation is mild. No  aortic stenosis is present.   8. The inferior vena cava is dilated in size with <50% respiratory  variability, suggesting right atrial pressure of 15 mmHg.   CHA2DS2-VASc Score = 4  The patient's score is based upon: CHF History: 0 HTN History: 1 Diabetes History: 0 Stroke History: 0 Vascular Disease History: 1 Age Score: 2 Gender Score: 0       ASSESSMENT AND PLAN: Persistent Atrial Fibrillation (ICD10:  I48.19) The patient's CHA2DS2-VASc score is 4, indicating a 4.8% annual risk of stroke.   S/p  DCCV on 12/22/23.  He is currently in Afib. Pleasant patient we had a discussion about options for rhythm control while he also gets his sleep hopefully improved. We had another discussion about medication treatments and ablation in detail. We discussed potential options such as Multaq and amiodarone. We talked about the monitoring required for these medications and potential adverse effects. We also discussed pulse field ablation as an option in the form of intervention. After discussion, patient is interested in speaking with EP regarding potential for Afib ablation. Will help refer to discuss ablation with EP.   PR 220 ms Qtc in NSR 465 ms   Secondary Hypercoagulable State (ICD10:  D68.69) The patient is at significant risk for stroke/thromboembolism based upon his CHA2DS2-VASc Score of 4.  Continue Apixaban  (Eliquis ).  No missed doses.  Will help establish with EP to discuss ablation.   Terra Pac, PA-C  Afib Clinic [facility] [address] [place], [place] [address] [phone]

## 2024-04-13 DIAGNOSIS — G4733 Obstructive sleep apnea (adult) (pediatric): Secondary | ICD-10-CM | POA: Diagnosis not present

## 2024-04-13 DIAGNOSIS — I4819 Other persistent atrial fibrillation: Secondary | ICD-10-CM | POA: Diagnosis not present

## 2024-04-30 NOTE — Progress Notes (Unsigned)
 Cardiology Office Note   Date:  05/01/2024  ID:  Spencer [name], DOB May 20, 1946, MRN [id number] PCP: Shona Norleen PEDLAR, MD  [facility] Providers Cardiologist:  None Electrophysiologist:  Spencer DELENA Primus, MD   History of Present Illness Spencer [name] is a 78 y.o. male with persistent AF, HTN, CAD, aortic atherosclerosis by CT, GERD, Barrett's esophagus, and remote GI bleed who presents for AF management.  He was recently seen in the AF clinic by Spencer [name] following hospital admission from 11/28/2023 - 11/30/2023 for abdominal pain that was consistent with enteritis.  He was diagnosed with new onset AF and started on Eliquis .  On evaluation in the AF clinic he was still in rate controlled AF on Lopressor  25 mg twice daily and Eliquis  5 mg twice daily for stroke risk reduction.  He is very active and exercises several times a week.  He had undergone successful cardioversion on 12/22/2023 however on follow-up 01/06/24 he was in AF again.  He could tell that he had generalized fatigue when he was in AF.  He had reduced his alcohol intake to 1 beer daily where he was drinking 1 beer along with 1 liquor drink a day.  He had also stopped consuming caffeine.  He recently was evaluated by sleep medicine for possible OSA.   He presents today for AF management and discussion on rhythm control strategies.  He remains in AF today.  Is hard for him to differentiate symptoms related to AF but he does think he has some DOE.  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  He lives locally with his wife.  He unfortunately still has a lot of grief related to the passing of his son in 2019 from suicide.  ROS: DOE  Studies Reviewed  TTE Result date: 11/29/23 1. Left ventricular ejection fraction, by estimation, is 60 to 65%. The left  ventricle has normal function. The left ventricle has no regional wall motion abnormalities. Left ventricular diastolic parameters are indeterminate. 2. Right ventricular systolic function is normal. The right ventricular size is normal. There is normal pulmonary artery systolic pressure. 3. Left atrial size was mildly dilated. 4. Right atrial size was mildly dilated. 5. The mitral valve is normal in structure. Trivial mitral valve regurgitation. No evidence of mitral stenosis. 6. The tricuspid valve is abnormal. 7. The aortic valve is tricuspid. Aortic valve regurgitation is mild. No aortic stenosis is present. 8. The inferior vena cava is dilated in size with <50% respiratory variability, suggesting right atrial pressure of 15 mmHg.  Risk Assessment/Calculations  CHA2DS2-VASc Score = 4   This indicates a 4.8% annual risk of stroke. The patient's score is based upon: CHF History: 0 HTN History: 1 Diabetes History: 0 Stroke History: 0 Vascular Disease History: 1 Age Score: 2 Gender Score: 0  Physical Exam VS:  BP (!) 150/88   Pulse (!) 105   Ht 6' (1.829 m)   Wt 185 lb (83.9 kg)   SpO2 95%   BMI 25.09 kg/m       Wt Readings from Last 3 Encounters:  05/01/24 185 lb (83.9 kg)  04/10/24 197 lb 9.6 oz (89.6 kg)  02/18/24 196 lb (88.9 kg)    GEN: Well nourished, well developed in no acute distress CARDIAC: irregular rhythm, regular rate, no murmurs, rubs,  gallops RESPIRATORY:  Clear to auscultation without rales, wheezing or rhonchi  ABDOMEN: Soft, non-tender, non-distended EXTREMITIES:  No edema; No deformity   ASSESSMENT AND PLAN Spencer [name] is a 78 y.o. male with persistent AF, HTN, CAD, aortic atherosclerosis by CT, GERD, Barrett's esophagus, and remote GI bleed who presents for AF management.  Persistent AF  We had a long discussion today regarding different management options of his atrial arrhythmias.  He had cardioversion earlier in the year with return of AF  shortly thereafter.  His left atrial size is only mildly dilated however his AF has become persistent.  We discussed different options including antiarrhythmic medications versus ablation to reduce the frequency and duration of his AF episodes.  We also discussed different risk factors including OSA which he has been diagnosed with along with his alcohol consumption.  He consumes on average 1 beer a day and 4 ounce of liquor fairly consistently.  He does think that he can cut back on this.  A lot of this stems from his son's passing in 2019 and his coping with it.   Discussed treatment options today for AF including antiarrhythmic drug therapy and ablation. Discussed risks, recovery and likelihood of success with each treatment strategy. Risk, benefits, and alternatives to EP study and ablation for afib were discussed. These risks include but are not limited to stroke, bleeding, vascular damage, tamponade, perforation, damage to the esophagus, lungs, phrenic nerve and other structures, worsening renal function, coronary vasospasm and death.  Discussed potential need for repeat ablation procedures and antiarrhythmic drugs after an initial ablation. The patient understands these risk and wishes to proceed.  We will therefore proceed with catheter ablation at the next available time.  Carto, ICE, anesthesia are requested for the procedure.  He has a trip planned at the end of October and would like to schedule ablation following this.   Pre procedure details: Procedure date: 06/09/24 Carto/ICE/GA-anesthesia, no CT scan pre procedure  Hold apixaban  and all medications the morning of the procedure Take apixaban  and all medications the night prior   Dispo: f/u post procedure  A total of 55 minutes was spent preparing for the patient, reviewing history, performing exam, document encounter, coordinating care and counseling the patient. 35 minutes was spent with direct patient care.   Signed, Spencer DELENA Primus, MD

## 2024-05-01 ENCOUNTER — Ambulatory Visit
Attending: Student in an Organized Health Care Education/Training Program | Admitting: Student in an Organized Health Care Education/Training Program

## 2024-05-01 ENCOUNTER — Encounter: Payer: Self-pay | Admitting: Student in an Organized Health Care Education/Training Program

## 2024-05-01 VITALS — BP 150/88 | HR 105 | Ht 72.0 in | Wt 185.0 lb

## 2024-05-01 DIAGNOSIS — I4819 Other persistent atrial fibrillation: Secondary | ICD-10-CM | POA: Diagnosis not present

## 2024-05-01 NOTE — Patient Instructions (Signed)
 Medication Instructions:  Your physician recommends that you continue on your current medications as directed. Please refer to the Current Medication list given to you today.  *If you need a refill on your cardiac medications before your next appointment, please call your pharmacy*  Testing/Procedures: Ablation Your physician has recommended that you have an ablation. Catheter ablation is a medical procedure used to treat some cardiac arrhythmias (irregular heartbeats). During catheter ablation, a long, thin, flexible tube is put into a blood vessel in your groin (upper thigh), or neck. This tube is called an ablation catheter. It is then guided to your heart through the blood vessel. Radio frequency waves destroy small areas of heart tissue where abnormal heartbeats may cause an arrhythmia to start.   You are scheduled for Atrial Fibrillation Ablation on Friday, November 7 with Dr. Dr. Almetta. Please arrive at the Main Entrance A at [facility]: [address] [place], [place] [address] at 5:30 AM   What To Expect:  Labs: you will need to have lab work drawn within 30 days of your procedure. Please go to any [facility] location to have these drawn - no appointment is needed. After your procedure we recommend no driving for 4 days, no lifting over 5 lbs for 7 days, and no work or strenuous activity for 7 days.  Please contact our office at [phone] if you have any questions.   Follow-Up: We will contact you to schedule your post-procedure appointments.

## 2024-05-09 ENCOUNTER — Telehealth: Payer: Self-pay

## 2024-05-09 NOTE — Telephone Encounter (Signed)
-----   Message from Nurse Carlyle C sent at 05/01/2024  3:15 PM EDT ----- Regarding: 11/7 afib ablation Precert:  MD: Almetta Type of ablation: A-fib Diagnosis: A-fib CPT code: A-fib (06343) Ablation scheduled (date/time): 11/7 at 7:30am  Procedure:  Added to calendar? Yes Orders entered? Yes Letter complete? Yes Scheduled with cath lab? Yes Any medications to hold? No Labs ordered (CBC, BMET, PT/INR if on warfarin): Yes Mapping system: CARTO (lab 4 or 6) CARTO/OPAL rep notified? No Cardiac CT needed? No Dye allergy? No Pre-meds ordered and instructions given? No, not needed Letter method: given to patient in clinic H&P: 9/29 Device: No  Follow-up:  Cassie/Angel, please schedule Routine.  Covering RN - please send this message to [employer], EP scheduler, EP Scheduling pool, EP [employer], and CT scheduler ([place] Lynch/Stephanie Mogg), if indicated.

## 2024-05-11 DIAGNOSIS — G4733 Obstructive sleep apnea (adult) (pediatric): Secondary | ICD-10-CM | POA: Diagnosis not present

## 2024-05-22 ENCOUNTER — Telehealth (HOSPITAL_COMMUNITY): Payer: Self-pay

## 2024-05-22 NOTE — Telephone Encounter (Signed)
 Attempted to reach patient to discuss upcoming procedure, no answer after continuous rings.

## 2024-05-24 ENCOUNTER — Other Ambulatory Visit (HOSPITAL_COMMUNITY): Payer: Self-pay | Admitting: *Deleted

## 2024-05-24 MED ORDER — METOPROLOL TARTRATE 25 MG PO TABS: 25.0000 mg | ORAL_TABLET | Freq: Two times a day (BID) | ORAL | 6 refills | Status: AC

## 2024-05-24 MED ORDER — APIXABAN 5 MG PO TABS: 5.0000 mg | ORAL_TABLET | Freq: Two times a day (BID) | ORAL | 6 refills | Status: AC

## 2024-06-02 ENCOUNTER — Other Ambulatory Visit (HOSPITAL_COMMUNITY): Payer: Self-pay

## 2024-06-02 DIAGNOSIS — I4819 Other persistent atrial fibrillation: Secondary | ICD-10-CM

## 2024-06-03 LAB — CBC
Hematocrit: 46.1 % (ref 37.5–51.0)
Hemoglobin: 15.8 g/dL (ref 13.0–17.7)
MCH: 33.8 pg — ABNORMAL HIGH (ref 26.6–33.0)
MCHC: 34.3 g/dL (ref 31.5–35.7)
MCV: 99 fL — ABNORMAL HIGH (ref 79–97)
Platelets: 169 x10E3/uL (ref 150–450)
RBC: 4.68 x10E6/uL (ref 4.14–5.80)
RDW: 11.6 % (ref 11.6–15.4)
WBC: 7.1 x10E3/uL (ref 3.4–10.8)

## 2024-06-03 LAB — BASIC METABOLIC PANEL WITH GFR
BUN/Creatinine Ratio: 15 (ref 10–24)
BUN: 15 mg/dL (ref 8–27)
CO2: 24 mmol/L (ref 20–29)
Calcium: 9.1 mg/dL (ref 8.6–10.2)
Chloride: 103 mmol/L (ref 96–106)
Creatinine, Ser: 0.98 mg/dL (ref 0.76–1.27)
Glucose: 91 mg/dL (ref 70–99)
Potassium: 4.1 mmol/L (ref 3.5–5.2)
Sodium: 140 mmol/L (ref 134–144)
eGFR: 80 mL/min/1.73 (ref >59)

## 2024-06-05 DIAGNOSIS — G4733 Obstructive sleep apnea (adult) (pediatric): Secondary | ICD-10-CM | POA: Diagnosis not present

## 2024-06-06 ENCOUNTER — Telehealth: Payer: Self-pay | Admitting: Student in an Organized Health Care Education/Training Program

## 2024-06-06 NOTE — Telephone Encounter (Signed)
 Pt c/o medication issue:  1. Name of Medication:  Eliquis  Lopressor  Pantoprazole   2. How are you currently taking this medication (dosage and times per day)?   3. Are you having a reaction (difficulty breathing--STAT)?   4. What is your medication issue? Patient would like to know if he needs to pack his medications to take after 11/07 ablation. He says he has been advised to hold all medications the morning of the procedure. Please advise.

## 2024-06-07 NOTE — Telephone Encounter (Signed)
 Returned call to pt and informed him that he can not bring his home medications with him. If he needs medications while there the will administer them from the Pharmacy.   He understood this and will not bring his medications.

## 2024-06-08 MED ORDER — SODIUM CHLORIDE 0.9 % IV SOLN: INTRAVENOUS | Status: DC

## 2024-06-08 NOTE — Anesthesia Preprocedure Evaluation (Signed)
 Anesthesia Evaluation  Patient identified by MRN, date of birth, ID band Patient awake    Reviewed: Allergy & Precautions, H&P , NPO status , Patient's Chart, lab work & pertinent test results  Airway Mallampati: II  TM Distance: >3 FB Neck ROM: Full    Dental no notable dental hx. (+) Teeth Intact, Dental Advisory Given, Caps   Pulmonary former smoker   Pulmonary exam normal breath sounds clear to auscultation       Cardiovascular hypertension, Pt. on home beta blockers and Pt. on medications negative cardio ROS Normal cardiovascular exam+ dysrhythmias Atrial Fibrillation  Rhythm:Regular Rate:Normal  ECHO 25  1. Left ventricular ejection fraction, by estimation, is 60 to 65%. The  left ventricle has normal function. The left ventricle has no regional  wall motion abnormalities. Left ventricular diastolic parameters are  indeterminate.   2. Right ventricular systolic function is normal. The right ventricular  size is normal. There is normal pulmonary artery systolic pressure.   3. Left atrial size was mildly dilated.   4. Right atrial size was mildly dilated.   5. The mitral valve is normal in structure. Trivial mitral valve  regurgitation. No evidence of mitral stenosis.   6. The tricuspid valve is abnormal.   7. The aortic valve is tricuspid. Aortic valve regurgitation is mild. No  aortic stenosis is present.   8. The inferior vena cava is dilated in size with <50% respiratory  variability, suggesting right atrial pressure of 15 mmHg.      Neuro/Psych negative neurological ROS  negative psych ROS   GI/Hepatic negative GI ROS, Neg liver ROS, Bowel prep,GERD  ,,  Endo/Other  negative endocrine ROS    Renal/GU negative Renal ROS  negative genitourinary   Musculoskeletal negative musculoskeletal ROS (+) Arthritis ,    Abdominal   Peds negative pediatric ROS (+)  Hematology negative hematology ROS (+)    Anesthesia Other Findings   Reproductive/Obstetrics negative OB ROS                              Anesthesia Physical Anesthesia Plan  ASA: 3  Anesthesia Plan: General   Post-op Pain Management:    Induction: Intravenous  PONV Risk Score and Plan: 2 and Treatment may vary due to age or medical condition, Ondansetron  and Dexamethasone   Airway Management Planned: Oral ETT  Additional Equipment: None  Intra-op Plan:   Post-operative Plan: Extubation in OR  Informed Consent: I have reviewed the patients History and Physical, chart, labs and discussed the procedure including the risks, benefits and alternatives for the proposed anesthesia with the patient or authorized representative who has indicated his/her understanding and acceptance.       Plan Discussed with: CRNA and Anesthesiologist  Anesthesia Plan Comments: (  )        Anesthesia Quick Evaluation

## 2024-06-08 NOTE — Pre-Procedure Instructions (Signed)
 Attempted to call patient regarding procedure instructions.  Left voicemail on the following items: Arrival time 0515 Nothing to eat or drink after midnight No meds AM of procedure Responsible person to drive you home and stay with you for 24 hrs  Have you missed any doses of anti-coagulant Eliquis- should be taken twice a day, if you have missed any doses please let us know.  Don't take dose morning of procedure.

## 2024-06-09 ENCOUNTER — Encounter (HOSPITAL_COMMUNITY): Payer: Self-pay | Admitting: Certified Registered"

## 2024-06-09 ENCOUNTER — Other Ambulatory Visit: Payer: Self-pay

## 2024-06-09 ENCOUNTER — Encounter (HOSPITAL_COMMUNITY)
Admission: RE | Disposition: A | Discharge status: Home / Self Care | Payer: Self-pay | Source: Home / Self Care | Attending: Student in an Organized Health Care Education/Training Program

## 2024-06-09 ENCOUNTER — Ambulatory Visit (HOSPITAL_COMMUNITY): Payer: Self-pay | Admitting: Certified Registered"

## 2024-06-09 ENCOUNTER — Ambulatory Visit (HOSPITAL_COMMUNITY)
Admission: RE | Admit: 2024-06-09 | Discharge: 2024-06-09 | Disposition: A | Discharge status: Home / Self Care | Attending: Student in an Organized Health Care Education/Training Program | Admitting: Student in an Organized Health Care Education/Training Program

## 2024-06-09 DIAGNOSIS — I10 Essential (primary) hypertension: Secondary | ICD-10-CM

## 2024-06-09 DIAGNOSIS — K219 Gastro-esophageal reflux disease without esophagitis: Secondary | ICD-10-CM | POA: Diagnosis not present

## 2024-06-09 DIAGNOSIS — Z87891 Personal history of nicotine dependence: Secondary | ICD-10-CM | POA: Diagnosis not present

## 2024-06-09 DIAGNOSIS — I4891 Unspecified atrial fibrillation: Secondary | ICD-10-CM

## 2024-06-09 DIAGNOSIS — Z7901 Long term (current) use of anticoagulants: Secondary | ICD-10-CM | POA: Diagnosis not present

## 2024-06-09 DIAGNOSIS — I4819 Other persistent atrial fibrillation: Secondary | ICD-10-CM

## 2024-06-09 HISTORY — PX: ATRIAL FIBRILLATION ABLATION: EP1191

## 2024-06-09 MED ORDER — CEFAZOLIN SODIUM-DEXTROSE 2-3 GM-%(50ML) IV SOLR
INTRAVENOUS | Status: DC | PRN
  Administered 2024-06-09: 2 g via INTRAVENOUS

## 2024-06-09 MED ORDER — SUGAMMADEX SODIUM 200 MG/2ML IV SOLN
INTRAVENOUS | Status: DC | PRN
  Administered 2024-06-09: 200 mg via INTRAVENOUS

## 2024-06-09 MED ORDER — ONDANSETRON HCL 4 MG/2ML IJ SOLN
INTRAMUSCULAR | Status: DC | PRN
  Administered 2024-06-09: 4 mg via INTRAVENOUS

## 2024-06-09 MED ORDER — APIXABAN 5 MG PO TABS
5.0000 mg | ORAL_TABLET | Freq: Two times a day (BID) | ORAL | Status: DC
  Administered 2024-06-09: 5 mg via ORAL
  Filled 2024-06-09: qty 1

## 2024-06-09 MED ORDER — LIDOCAINE 2% (20 MG/ML) 5 ML SYRINGE
INTRAMUSCULAR | Status: DC | PRN
  Administered 2024-06-09: 80 mg via INTRAVENOUS

## 2024-06-09 MED ORDER — CEFAZOLIN SODIUM-DEXTROSE 2-4 GM/100ML-% IV SOLN
INTRAVENOUS | Status: DC
Start: 2024-06-09 — End: 2024-06-09
  Filled 2024-06-09: qty 100

## 2024-06-09 MED ORDER — SODIUM CHLORIDE 0.9% FLUSH: 3.0000 mL | INTRAVENOUS | Status: DC | PRN

## 2024-06-09 MED ORDER — PROPOFOL 500 MG/50ML IV EMUL
INTRAVENOUS | Status: DC | PRN
  Administered 2024-06-09: 150 ug/kg/min via INTRAVENOUS

## 2024-06-09 MED ORDER — SODIUM CHLORIDE 0.9 % IV SOLN: 250.0000 mL | INTRAVENOUS | Status: DC | PRN

## 2024-06-09 MED ORDER — PROTAMINE SULFATE 10 MG/ML IV SOLN
INTRAVENOUS | Status: DC | PRN
  Administered 2024-06-09: 40 mg via INTRAVENOUS

## 2024-06-09 MED ORDER — SODIUM CHLORIDE 0.9% FLUSH: 3.0000 mL | Freq: Two times a day (BID) | INTRAVENOUS | Status: DC

## 2024-06-09 MED ORDER — PROPOFOL 10 MG/ML IV BOLUS
INTRAVENOUS | Status: DC | PRN
  Administered 2024-06-09: 90 mg via INTRAVENOUS

## 2024-06-09 MED ORDER — ROCURONIUM BROMIDE 10 MG/ML (PF) SYRINGE
PREFILLED_SYRINGE | INTRAVENOUS | Status: DC | PRN
Start: 2024-06-09 — End: 2024-06-09
  Administered 2024-06-09: 20 mg via INTRAVENOUS
  Administered 2024-06-09: 60 mg via INTRAVENOUS
  Administered 2024-06-09: 20 mg via INTRAVENOUS

## 2024-06-09 MED ORDER — FENTANYL CITRATE (PF) 100 MCG/2ML IJ SOLN
INTRAMUSCULAR | Status: DC | PRN
  Administered 2024-06-09: 100 ug via INTRAVENOUS

## 2024-06-09 MED ORDER — MEPERIDINE HCL 25 MG/ML IJ SOLN
6.2500 mg | INTRAMUSCULAR | Status: DC | PRN
  Filled 2024-06-09: qty 1

## 2024-06-09 MED ORDER — ACETAMINOPHEN 325 MG PO TABS: 650.0000 mg | ORAL_TABLET | ORAL | Status: DC | PRN

## 2024-06-09 MED ORDER — OXYCODONE HCL 5 MG/5ML PO SOLN: 5.0000 mg | Freq: Once | ORAL | Status: DC | PRN

## 2024-06-09 MED ORDER — HEPARIN (PORCINE) IN NACL 1000-0.9 UT/500ML-% IV SOLN
INTRAVENOUS | Status: DC | PRN
  Administered 2024-06-09 (×2): 500 mL

## 2024-06-09 MED ORDER — PHENYLEPHRINE HCL-NACL 20-0.9 MG/250ML-% IV SOLN
INTRAVENOUS | Status: DC | PRN
  Administered 2024-06-09 (×2): 50 ug/min via INTRAVENOUS

## 2024-06-09 MED ORDER — FENTANYL CITRATE (PF) 100 MCG/2ML IJ SOLN
INTRAMUSCULAR | Status: AC
  Filled 2024-06-09: qty 2

## 2024-06-09 MED ORDER — PHENYLEPHRINE 80 MCG/ML (10ML) SYRINGE FOR IV PUSH (FOR BLOOD PRESSURE SUPPORT)
PREFILLED_SYRINGE | INTRAVENOUS | Status: DC | PRN
Start: 2024-06-09 — End: 2024-06-09
  Administered 2024-06-09: 160 ug via INTRAVENOUS

## 2024-06-09 MED ORDER — DEXAMETHASONE SOD PHOSPHATE PF 10 MG/ML IJ SOLN
INTRAMUSCULAR | Status: DC | PRN
  Administered 2024-06-09: 5 mg via INTRAVENOUS

## 2024-06-09 MED ORDER — HEPARIN SODIUM (PORCINE) 1000 UNIT/ML IJ SOLN
INTRAMUSCULAR | Status: DC | PRN
Start: 2024-06-09 — End: 2024-06-09
  Administered 2024-06-09: 4000 [IU] via INTRAVENOUS
  Administered 2024-06-09: 19000 [IU] via INTRAVENOUS

## 2024-06-09 MED ORDER — HEPARIN SODIUM (PORCINE) 1000 UNIT/ML IJ SOLN
INTRAMUSCULAR | Status: DC | PRN
  Administered 2024-06-09: 1000 [IU] via INTRAVENOUS

## 2024-06-09 MED ORDER — OXYCODONE HCL 5 MG PO TABS: 5.0000 mg | ORAL_TABLET | Freq: Once | ORAL | Status: DC | PRN

## 2024-06-09 MED ORDER — ONDANSETRON HCL 4 MG/2ML IJ SOLN: 4.0000 mg | Freq: Once | INTRAMUSCULAR | Status: DC | PRN

## 2024-06-09 MED ORDER — FENTANYL CITRATE (PF) 100 MCG/2ML IJ SOLN: 25.0000 ug | INTRAMUSCULAR | Status: DC | PRN

## 2024-06-09 SURGICAL SUPPLY — 16 items
BAG SNAP BAND KOVER 36X36 (MISCELLANEOUS) IMPLANT
BLANKET WARM UNDERBOD FULL ACC (MISCELLANEOUS) ×1 IMPLANT
CATH GE 8FR SOUNDSTAR (CATHETERS) IMPLANT
CATH OCTARAY 2.0 F 3-3-3-3-3 (CATHETERS) IMPLANT
CATH WEB BI DIR CSDF CRV REPRO (CATHETERS) IMPLANT
CATHETER VARIPULSE 8.5FR (CATHETERS) IMPLANT
CLOSURE PERCLOSE PROSTYLE (Vascular Products) IMPLANT
COVER SWIFTLINK CONNECTOR (BAG) ×1 IMPLANT
KIT VERSACROSS 8.5F 63 45D 180 (KITS) IMPLANT
PACK EP LF (CUSTOM PROCEDURE TRAY) ×1 IMPLANT
PAD DEFIB RADIO PHYSIO CONN (PAD) ×1 IMPLANT
PATCH CARTO3 (PAD) IMPLANT
SHEATH CARTO VIZIGO MED CURVE (SHEATH) IMPLANT
SHEATH PINNACLE 8F 10CM (SHEATH) IMPLANT
SHEATH PINNACLE 9F 10CM (SHEATH) IMPLANT
SHEATH PROBE COVER 6X72 (BAG) IMPLANT

## 2024-06-09 NOTE — Interval H&P Note (Signed)
 History and Physical Interval Note:  06/09/2024 7:17 AM  Spencer [name]  has presented today for surgery, with the diagnosis of afib.  The various methods of treatment have been discussed with the patient and family. After consideration of risks, benefits and other options for treatment, the patient has consented to  Procedure(s): ATRIAL FIBRILLATION ABLATION (N/A) as a surgical intervention.  The patient's history has been reviewed, patient examined, no change in status, stable for surgery.  I have reviewed the patient's chart and labs.  Questions were answered to the patient's satisfaction.     Donnice DELENA Primus

## 2024-06-09 NOTE — Anesthesia Procedure Notes (Signed)
 Procedure Name: Intubation Date/Time: 06/09/2024 7:51 AM  Performed by: Delores Dus, CRNAPre-anesthesia Checklist: Patient identified, Emergency Drugs available, Suction available and Patient being monitored Patient Re-evaluated:Patient Re-evaluated prior to induction Oxygen Delivery Method: Circle system utilized Preoxygenation: Pre-oxygenation with 100% oxygen Induction Type: IV induction Ventilation: Mask ventilation without difficulty Laryngoscope Size: Miller and 2 Grade View: Grade II Tube type: Oral Tube size: 7.0 mm Number of attempts: 1 Airway Equipment and Method: Stylet and Oral airway Placement Confirmation: ETT inserted through vocal cords under direct vision, positive ETCO2 and breath sounds checked- equal and bilateral Secured at: 22 cm Tube secured with: Tape Dental Injury: Teeth and Oropharynx as per pre-operative assessment

## 2024-06-09 NOTE — Progress Notes (Signed)
 Patient and patient wife given discharge instructions, education provided no further questions at this time. Patient able to ambulate and void before discharge. Able to tolerate PO intake. Patient site noted to ooze with ambulation at 1145, MD made aware pressure held for 10 minutes oozing stopped, attempted to ambulate again after 30 min as verified with MD Almetta, small amount of oozing noted NP Riddle made aware at bedside, waited for MD Almetta to come to bedside around 1530, attempted to ambulate again per MD Almetta okay to d/c home.

## 2024-06-09 NOTE — Discharge Instructions (Signed)

## 2024-06-09 NOTE — Anesthesia Postprocedure Evaluation (Signed)
 Anesthesia Post Note  Patient: Spencer [name]  Procedure(s) Performed: ATRIAL FIBRILLATION ABLATION     Patient location during evaluation: PACU Anesthesia Type: General Level of consciousness: awake and alert Pain management: pain level controlled Vital Signs Assessment: post-procedure vital signs reviewed and stable Respiratory status: spontaneous breathing, nonlabored ventilation, respiratory function stable and patient connected to nasal cannula oxygen Cardiovascular status: blood pressure returned to baseline and stable Postop Assessment: no apparent nausea or vomiting Anesthetic complications: no   No notable events documented.  Last Vitals:  Vitals:   06/09/24 1000 06/09/24 1005  BP: 135/87 135/88  Pulse: 68 68  Resp: 12 10  Temp:  (!) 36.4 C  SpO2: 99% 99%    Last Pain:  Vitals:   06/09/24 1005  TempSrc: Oral  PainSc: 0-No pain   Pain Goal:                   Cathline Dowen

## 2024-06-09 NOTE — H&P (Signed)
 Date: 06/09/24 ID:  Spencer [name], DOB 17-Aug-1945, MRN [id number] PCP: Shona Norleen PEDLAR, Spencer [name]  [facility] Providers Cardiologist:  None Electrophysiologist:  Spencer DELENA Primus, Spencer [name]    History of Present Illness Spencer [name] is a 78 y.o. male with persistent AF, HTN, CAD, aortic atherosclerosis by CT, GERD, Barrett's esophagus, and remote GI bleed who presents for AF management.   He was recently seen in the AF clinic by Spencer [name] following hospital admission from 11/28/2023 - 11/30/2023 for abdominal pain that was consistent with enteritis.  He was diagnosed with new onset AF and started on Eliquis .  On evaluation in the AF clinic he was still in rate controlled AF on Lopressor  25 mg twice daily and Eliquis  5 mg twice daily for stroke risk reduction.  He is very active and exercises several times a week.  He had undergone successful cardioversion on 12/22/2023 however on follow-up 01/06/24 he was in AF again.  He could tell that he had generalized fatigue when he was in AF.  He had reduced his alcohol intake to 1 beer daily where he was drinking 1 beer along with 1 liquor drink a day.  He had also stopped consuming caffeine.  He recently was evaluated by sleep medicine for possible OSA.    He presents today for AF management and discussion on rhythm control strategies.  He remains in AF today.  Is hard for him to differentiate symptoms related to AF but he does think he has some DOE.   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  He lives locally with his wife.  He unfortunately still has a lot of grief related to the passing of his son in 2019 from suicide.   ROS: DOE   Studies Reviewed   TTE Result date: 11/29/23 1. Left ventricular ejection fraction, by estimation, is 60 to 65%. The left ventricle has normal  function. The left ventricle has no regional wall motion abnormalities. Left ventricular diastolic parameters are indeterminate. 2. Right ventricular systolic function is normal. The right ventricular size is normal. There is normal pulmonary artery systolic pressure. 3. Left atrial size was mildly dilated. 4. Right atrial size was mildly dilated. 5. The mitral valve is normal in structure. Trivial mitral valve regurgitation. No evidence of mitral stenosis. 6. The tricuspid valve is abnormal. 7. The aortic valve is tricuspid. Aortic valve regurgitation is mild. No aortic stenosis is present. 8. The inferior vena cava is dilated in size with <50% respiratory variability, suggesting right atrial pressure of 15 mmHg.   Risk Assessment/Calculations   CHA2DS2-VASc Score = 4   This indicates a 4.8% annual risk of stroke. The patient's score is based upon: CHF History: 0 HTN History: 1 Diabetes History: 0 Stroke History: 0 Vascular Disease History: 1 Age Score: 2 Gender Score: 0   Physical Exam VS:  BP (!) 150/88   Pulse (!) 105   Ht 6' (1.829 m)   Wt 185 lb (83.9 kg)   SpO2 95%   BMI 25.09 kg/m          Wt Readings from Last 3 Encounters:  05/01/24 185 lb (83.9 kg)  04/10/24 197 lb 9.6 oz (89.6 kg)  02/18/24 196 lb (88.9 kg)    GEN: Well nourished, well developed in no acute distress CARDIAC:  irregular rhythm, regular rate, no murmurs, rubs, gallops RESPIRATORY:  Clear to auscultation without rales, wheezing or rhonchi  ABDOMEN: Soft, non-tender, non-distended EXTREMITIES:  No edema; No deformity    ASSESSMENT AND PLAN Spencer [name] is a 78 y.o. male with persistent AF, HTN, CAD, aortic atherosclerosis by CT, GERD, Barrett's esophagus, and remote GI bleed who presents for AF management.   Persistent AF  We had a long discussion today regarding different management options of his atrial arrhythmias.  He had cardioversion earlier in the year with return of AF shortly  thereafter.  His left atrial size is only mildly dilated however his AF has become persistent.  We discussed different options including antiarrhythmic medications versus ablation to reduce the frequency and duration of his AF episodes.  We also discussed different risk factors including OSA which he has been diagnosed with along with his alcohol consumption.  He consumes on average 1 beer a day and 4 ounce of liquor fairly consistently.  He does think that he can cut back on this.  A lot of this stems from his son's passing in 2019 and his coping with it.    Discussed treatment options today for AF including antiarrhythmic drug therapy and ablation. Discussed risks, recovery and likelihood of success with each treatment strategy. Risk, benefits, and alternatives to EP study and ablation for afib were discussed. These risks include but are not limited to stroke, bleeding, vascular damage, tamponade, perforation, damage to the esophagus, lungs, phrenic nerve and other structures, worsening renal function, coronary vasospasm and death.  Discussed potential need for repeat ablation procedures and antiarrhythmic drugs after an initial ablation. The patient understands these risk and wishes to proceed.  We will therefore proceed with catheter ablation at the next available time.  Carto, ICE, anesthesia are requested for the procedure.   He has a trip planned at the end of October and would like to schedule ablation following this.    Pre procedure details: Procedure date: 06/09/24 Carto/ICE/GA-anesthesia, no CT scan pre procedure  Hold apixaban  and all medications the morning of the procedure Take apixaban  and all medications the night prior

## 2024-06-09 NOTE — Transfer of Care (Signed)
 Immediate Anesthesia Transfer of Care Note  Patient: Spencer [name]  Procedure(s) Performed: ATRIAL FIBRILLATION ABLATION  Patient Location: PACU  Anesthesia Type:General  Level of Consciousness: awake, alert , and oriented  Airway & Oxygen Therapy: Patient Spontanous Breathing and Patient connected to nasal cannula oxygen  Post-op Assessment: Report given to RN and Post -op Vital signs reviewed and stable  Post vital signs: Reviewed and stable  Last Vitals:  Vitals Value Taken Time  BP 132/86 06/09/24 09:30  Temp    Pulse 68 06/09/24 09:33  Resp 19 06/09/24 09:33  SpO2 99 % 06/09/24 09:33  Vitals shown include unfiled device data.  Last Pain:  Vitals:   06/09/24 0615  PainSc: 0-No pain         Complications: No notable events documented.

## 2024-06-10 NOTE — Op Note (Signed)
   Procedure:  Intracardiac catheter ablation AFIB including Transseptal Cath (CPT [id number]) Additional linear/focal atrial ablation (CPT [phone])  Pre-Op Diagnosis: persistent AF  Post-Op Diagnosis: same   Procedure Date: 06/09/24   Attending: Adina Primus, MD   Anesthesia: general anesthesia   Initial Intervals: AF, QRS 81, QT 379, RR 1145  Procedure: The patient entered the EP lab in a fasting nonsedated state. The procedural time-out was achieved. The patient was placed under general anesthesia by [facility]. Once the patient was draped and prepped in normal fashion with multiple layers of Hibiclens  scrub in the bilateral groins, access to the veins was achieved under ultrasound guidance using the modified Seldinger technique. Following access both sites were pre closed with Perclose ProStyle suture mediated closure.    Access/sheath: - RFV: 8 Fr short sheath->8.5 Fr Sm curl Vizigo - RFV: 8 Fr short sheath  - RFV: 9 Fr short sheath   Catheters: - 8.5 Fr SoundStar ICE - Varipulse  PFA catheter - OctaRay 3-3-3-3-3 mapping catheter  - Webster CS D/F decapolar catheter  Transseptal Access Systemic heparinization was given to maintain ACT's between >350. Transseptal puncture was performed with the VersaCross wire through the Vizigo sheath using ICE and fluoroscopy. The sheath was carefully aspirated and flushed.      Mapping: Next, a deflectable OctaRay was advanced into the left atrium through the Vizigo sheath.  DCCV x 3 required for NSR (100J and 200J with no effect, 360J with conversion to NSR). A 3-dimensional electroanatomic map was constructed of the left atrium and pulmonary veins using the Carto mapping system.     Ablation: The OctaRay was removed from the Vizigo sheath and the Carto Varipulse PFA catheter was advanced to the LSPV os. The PFA catheter was advanced to each of the 4 pulmonary veins and at least 4 PFA applications were applied per vein. Additional  lesions were applied across both carinas and the RPV septum. Following initial ablation, repeat mapping with the Varipulse catheter confirmed first pass isolation in all 4 veins. There was significant fractionation along the mid PW. Varipulse was reintroduced into the LA and additional lesions were applied across the PW. Repeat mapping with OctaRay confirmed PWI along with PVI.    Heparinization was then reversed with protamine. Catheters and sheaths were pulled and hemostasis obtained with Perclose ProStyle sutures. No complications were evident. Final ICE assessment without pericardial effusion. The patient was transported to post procedure holding.   Final intervals: NSR, PR 262, QRS 81, QT 397, RR 1035  Summary: AF on arrival   Successful transseptal puncture x 1 with ICE guidance  3-D mapping of the LA and PV's   Successful PV isolation ([facility]) along with PWI using J&J Varipulse PFA    Recommendations: Bedrest x 2 hours  Anti-coagulation: Resume apixaban  5 mg bid once bedrest complete Anti-platelet: none Anti-arrhythmic: none Rate control: resume OP metoprolol  tartrate 25 mg bid EP f/u to be scheduled   Donnice DELENA Primus, MD [facility]  Cardiac Electrophysiology

## 2024-06-11 ENCOUNTER — Encounter (HOSPITAL_COMMUNITY): Payer: Self-pay | Admitting: Student in an Organized Health Care Education/Training Program

## 2024-06-12 ENCOUNTER — Telehealth (HOSPITAL_COMMUNITY): Payer: Self-pay

## 2024-06-12 ENCOUNTER — Encounter: Payer: Self-pay | Admitting: Emergency Medicine

## 2024-06-12 LAB — POCT ACTIVATED CLOTTING TIME
Activated Clotting Time: 400 s
Activated Clotting Time: 348 s

## 2024-06-12 MED FILL — Cefazolin Sodium-Dextrose IV Solution 2 GM/100ML-4%: INTRAVENOUS | Qty: 100 | Status: AC

## 2024-06-12 NOTE — Telephone Encounter (Signed)
 Spoke with patient to complete post procedure follow up call.  Patient reports no complications with groin sites.   Instructions reviewed with patient:  It is normal to have bruising, tenderness, mild swelling, and a pea or marble sized lump/knot at the groin site which can take up to three months to resolve.  Get help right away if you notice sudden swelling at the puncture site.  Check your puncture site every day for signs of infection: fever, redness, swelling, pus drainage, warmth, foul odor or excessive pain. If this occurs, please call [phone], to speak with the RN Navigator. Get help right away if your puncture site is bleeding and the bleeding does not stop after applying firm pressure to the area.  You may continue to have skipped beats/ atrial fibrillation during the first several months after your procedure.  It is very important not to miss any doses of your blood thinner Eliquis .    You will follow up with the Afib clinic 4 weeks after your procedure and follow up with the Dr. Dr. Almetta 3 months after your procedure.  Activity restrictions reviewed.  Patient verbalized understanding to all instructions provided.

## 2024-06-22 ENCOUNTER — Ambulatory Visit: Admitting: Podiatry

## 2024-06-22 DIAGNOSIS — L97502 Non-pressure chronic ulcer of other part of unspecified foot with fat layer exposed: Secondary | ICD-10-CM

## 2024-06-22 NOTE — Progress Notes (Signed)
 HPI: Patient presents with complaint of recurrence ulcer hallux left.  He was using Santyl on it but then started putting Bactroban on it since it seems to do better.  Soaking twice a day and Epsom salt water .     Denies N/V/F/Ch.   Objective:   Vascular:  DP pulses 2/4 bilaterally. PT pulses 2/4 bilaterally.  Capillary refill time immediate bilaterally.   Neurologic: Decreased protective sensation feet bilaterally  Dermatologic: Full-thickness ulceration plantar medial hallux left penetrating the subcutaneous tissue.. Wound base of mixed granulation and devitalized tissues..  Moderate serous exudate.  Predebridement ulcer measures 2 mm x 3 mm x 2 deep.  Musculoskeletal:     Assessment:  Full-thickness Wagner grade 2 ulceration hallux left.  Plan:  Patient was evaluated and treated and all questions answered.  Ulcer plantar medial hallux left -Debridement as below. -Dressed with antibiotic ointment and DSD. -Continue off-loading with surgical shoe. -Wound care: Soak twice daily luke warm epsom salt water  15 min . Apply Bactroban ointment and dressing.  Procedure: Excisional Debridement of Wound Tool: Sharp #312 chisel blade/tissue nipper Type of Debridement: Sharp Excisional Frequency: Every 1 to 2 weeks until appropriately healed.   Rationale: Removal of non-viable soft tissue from the wound to promote healing.  Anesthesia: none  Pre-Debridement Wound Measurements: 2 mm x 3 mm x 2 mm deep Post-Debridement Wound Measurements: 6 mm x 4 mm x 4 mm deep Area devitalized tissue removed(nonviable tissue only): 6 mm x 4 mm.  Blood loss: Minimal (<50cc) Depth of Debridement: Full-thickness into subcutaneous tissue  Description of tissue removed: Devitalized tissue Technique: The wound and the surrounding skin were prepped and draped in usual aseptic fashion.  Aseptic technique was maintained throughout the procedure.  Using #312 blade/tissue nipper sharp debridement of  necrotic/nonviable tissue was performed until healthy bleeding wound bed was achieved.  No underlying bone or tendon was exposed during debridement.  The wound was thoroughly irrigated with normal saline solution Wound Progress:  Debridement removed 6 mm x 4 mm of the necrotic tissue and subcutaneous tissue.  Purulent drainage not present. Nonviable tissue present at the base of the wound.  Sharp debridement was performed to remove the necrotic tissue/nonviable tissue back to viable tissue.  No devitalized/nonviable tissue present postdebridement.  Wound appeared clean and clear of infection No material in the wound was present that was identified to be inhibiting healing. Dressing: Dry, sterile, compression dressing. Disposition: Patient tolerated procedure well.   Return 2 weeks f/u ulcer

## 2024-06-23 ENCOUNTER — Ambulatory Visit (HOSPITAL_COMMUNITY)
Admission: RE | Admit: 2024-06-23 | Discharge: 2024-06-23 | Disposition: A | Discharge status: Home / Self Care | Source: Ambulatory Visit | Attending: Nurse Practitioner | Admitting: Nurse Practitioner

## 2024-06-23 DIAGNOSIS — I700 Atherosclerosis of aorta: Secondary | ICD-10-CM | POA: Diagnosis not present

## 2024-06-23 DIAGNOSIS — R911 Solitary pulmonary nodule: Secondary | ICD-10-CM | POA: Insufficient documentation

## 2024-06-23 DIAGNOSIS — J929 Pleural plaque without asbestos: Secondary | ICD-10-CM | POA: Diagnosis not present

## 2024-06-23 DIAGNOSIS — R918 Other nonspecific abnormal finding of lung field: Secondary | ICD-10-CM | POA: Diagnosis not present

## 2024-06-23 DIAGNOSIS — K449 Diaphragmatic hernia without obstruction or gangrene: Secondary | ICD-10-CM | POA: Diagnosis not present

## 2024-07-04 DIAGNOSIS — Z125 Encounter for screening for malignant neoplasm of prostate: Secondary | ICD-10-CM | POA: Diagnosis not present

## 2024-07-04 DIAGNOSIS — E559 Vitamin D deficiency, unspecified: Secondary | ICD-10-CM | POA: Diagnosis not present

## 2024-07-04 DIAGNOSIS — I10 Essential (primary) hypertension: Secondary | ICD-10-CM | POA: Diagnosis not present

## 2024-07-05 DIAGNOSIS — G4733 Obstructive sleep apnea (adult) (pediatric): Secondary | ICD-10-CM | POA: Diagnosis not present

## 2024-07-06 ENCOUNTER — Ambulatory Visit: Admitting: Podiatry

## 2024-07-06 DIAGNOSIS — L97502 Non-pressure chronic ulcer of other part of unspecified foot with fat layer exposed: Secondary | ICD-10-CM

## 2024-07-06 NOTE — Progress Notes (Signed)
 HPI: Patient presents for follow-up ulcer hallux left.  Using the Bactroban after soaking it twice a day.  Bleeding gauze dressing on it.  Denies N/V/F/Ch.     Objective:    Vascular:  DP pulses 2/4 bilaterally. PT pulses 2/4 bilaterally.  Capillary refill time immediate bilaterally.     Neurologic: Decreased protective sensation feet bilaterally   Dermatologic: Full-thickness ulceration plantar medial hallux left penetrating the subcutaneous tissue.. Wound base of mixed granulation and devitalized tissues..  Mild serous exudate.  Predebridement ulcer measures 3 mm x 1 mm x 2 deep.   Musculoskeletal:         Assessment:  Full-thickness Wagner grade 2 ulceration hallux left.   Plan:  Patient was evaluated and treated and all questions answered.   Ulcer plantar medial hallux left -Debridement as below. -Dressed with antibiotic ointment and DSD. -Continue off-loading with surgical shoe. -Wound care: Soak twice daily luke warm epsom salt water  15 min . Apply Bactroban ointment and dressing.   Procedure: Excisional Debridement of Wound Tool: Sharp #312 chisel blade/tissue nipper Type of Debridement: Sharp Excisional Frequency: Every 1 to 2 weeks until appropriately healed.   Rationale: Removal of non-viable soft tissue from the wound to promote healing.  Anesthesia: none   Pre-Debridement Wound Measurements: 3 mm x 1 mm x 2 mm deep Post-Debridement Wound Measurements: 5 mm x 3 mm x 3 mm deep Area devitalized tissue removed(nonviable tissue only): 5mm x 3 mm.  Blood loss: Minimal (<50cc) Depth of Debridement: Full-thickness into subcutaneous tissue  Description of tissue removed: Devitalized tissue Technique: The wound and the surrounding skin were prepped and draped in usual aseptic fashion.  Aseptic technique was maintained throughout the procedure.  Using #312 blade/tissue nipper sharp debridement of necrotic/nonviable tissue was performed until healthy bleeding wound bed  was achieved.  No underlying bone or tendon was exposed during debridement.  The wound was thoroughly irrigated with normal saline solution Wound Progress:  Debridement removed 5 mm x3 mm of the necrotic tissue and subcutaneous tissue.  Purulent drainage not present. Nonviable tissue present at the base of the wound.  Sharp debridement was performed to remove the necrotic tissue/nonviable tissue back to viable tissue.  No devitalized/nonviable tissue present postdebridement.  Wound appeared clean and clear of infection No material in the wound was present that was identified to be inhibiting healing. Dressing: Dry, sterile, compression dressing. Disposition: Patient tolerated procedure well.    Return 2 weeks f/u ulcer

## 2024-07-07 ENCOUNTER — Ambulatory Visit (HOSPITAL_COMMUNITY)
Admission: RE | Admit: 2024-07-07 | Discharge: 2024-07-07 | Disposition: A | Source: Ambulatory Visit | Attending: Internal Medicine | Admitting: Internal Medicine

## 2024-07-07 ENCOUNTER — Encounter (HOSPITAL_COMMUNITY): Payer: Self-pay | Admitting: Internal Medicine

## 2024-07-07 VITALS — BP 134/80 | HR 77 | Ht 72.0 in | Wt 190.0 lb

## 2024-07-07 DIAGNOSIS — I4819 Other persistent atrial fibrillation: Secondary | ICD-10-CM | POA: Diagnosis not present

## 2024-07-07 DIAGNOSIS — D6869 Other thrombophilia: Secondary | ICD-10-CM | POA: Diagnosis not present

## 2024-07-07 NOTE — Progress Notes (Signed)
 Primary Care Physician: Shona Norleen PEDLAR, MD Primary Cardiologist: None Electrophysiologist: Donnice DELENA Primus, MD  Referring Physician: Dr. Primus Helayne JONELLE Spencer [name] is a 78 y.o. 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 Hospital admission 4/27-29/2025 for abdominal pain likely enteritis found to be in new Afib. Patient is on Eliquis  for a CHADS2VASC score of 4. He was evaluated by Dr. Primus on 05/01/2024 and was found to be a candidate for ablation.  He underwent procedure on 06/09/2024 with no postprocedural complications noted. He is currently on metoprolol  25 mg twice daily and presents today for post ablation follow up.  Today, he is in sinus rhythm and reports no acute changes since his ablation procedure. He reports some occasional palpitations that resolve spontaneously and are associated  with stress and insomnia. We discussed the possible return of AF during the blanking period and the importance of AC compliance. He expressed a full understanding and denied symptoms of palpitations, chest pain, shortness of breath, orthopnea, PND, lower extremity edema, dizziness, presyncope, syncope, snoring, daytime somnolence, bleeding, or neurologic sequela.   Atrial Fibrillation Risk Factors:  he does have symptoms or diagnosis of sleep apnea. He does drink ETOH daily due to his son's passing  Atrial Fibrillation Management history:  Previous antiarrhythmic drugs: none Previous cardioversions: 12/22/23 Previous ablations: none Anticoagulation history: Eliquis   ROS- All systems are reviewed and negative except as per the HPI above.  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     ALPRAZolam  (XANAX ) 1 MG tablet Take 1 mg by mouth at bedtime.     apixaban  (ELIQUIS ) 5 MG TABS tablet Take 1 tablet (5 mg total) by mouth 2 (two) times daily. 60 tablet 6   Cyanocobalamin  (VITAMIN B-12 PO) Take 1 tablet by mouth daily.     famotidine  (PEPCID ) 20 MG tablet Take 20 mg by mouth at bedtime.     Magnesium  250 MG CAPS Take 250 mg by mouth at bedtime.     metoprolol  tartrate (LOPRESSOR ) 25 MG tablet Take 1 tablet (25 mg total) by mouth 2 (two) times daily. 60 tablet 6   Misc Natural Products (BEET ROOT PO) Take 1 capsule by mouth daily.     Multiple Vitamin (MULTIVITAMIN WITH MINERALS) TABS tablet Take 1 tablet by mouth daily. 50+     Omega-3 Fatty Acids (FISH OIL PO) Take 1 capsule by mouth at bedtime.     pantoprazole  (PROTONIX ) 40 MG tablet Take 1 tablet (40 mg total) by mouth daily. 30 tablet 11   Polyethyl Glycol-Propyl Glycol (SYSTANE ULTRA PF OP) Place 1 drop into both eyes daily.     Psyllium (METAMUCIL PO) Take 1 Dose by mouth daily with supper.     VITAMIN D PO Take 1 tablet by mouth daily.     Wheat Dextrin (BENEFIBER DRINK MIX PO) Take 1 Dose by mouth every  morning.     No current facility-administered medications for this encounter.    Physical Exam: BP 134/80   Pulse 77   Ht 6' (1.829 m)   Wt 86.2 kg   BMI 25.77 kg/m   GEN: Well nourished, well developed in no acute distress NECK: No JVD; No carotid bruits CARDIAC: Regular rate and rhythm, no murmurs, rubs, gallops RESPIRATORY:  Clear to auscultation without rales, wheezing or rhonchi  ABDOMEN: Soft, non-tender, non-distended EXTREMITIES:  No edema; No deformity   Wt Readings from Last 3  Encounters:  07/07/24 86.2 kg  06/09/24 86.2 kg  05/01/24 83.9 kg     EKG today demonstrates:   EKG Interpretation Date/Time:  Friday July 07 2024 11:23:03 EST Ventricular Rate:  77 PR Interval:  220 QRS Duration:  88 QT Interval:  408 QTC Calculation: 461 R Axis:   -23  Text Interpretation: Sinus rhythm with 1st degree A-V block Otherwise normal ECG When compared with ECG of 09-Jun-2024 09:38, No significant change was found Confirmed by Wyn Manus [phone]) on 07/07/2024 12:02:18 PM        Echo 11/29/23 demonstrated  1. Left ventricular ejection fraction, by estimation, is 60 to 65%. The  left ventricle has normal function. The left ventricle has no regional  wall motion abnormalities. Left ventricular diastolic parameters are  indeterminate.   2. Right ventricular systolic function is normal. The right ventricular  size is normal. There is normal pulmonary artery systolic pressure.   3. Left atrial size was mildly dilated.   4. Right atrial size was mildly dilated.   5. The mitral valve is normal in structure. Trivial mitral valve  regurgitation. No evidence of mitral stenosis.   6. The tricuspid valve is abnormal.   7. The aortic valve is tricuspid. Aortic valve regurgitation is mild. No  aortic stenosis is present.   8. The inferior vena cava is dilated in size with <50% respiratory  variability, suggesting right atrial pressure of 15 mmHg. 0000000000000000   CHA2DS2-VASc Score = 4  The patient's score is based upon: CHF History: 0 HTN History: 1 Diabetes History: 0 Stroke History: 0 Vascular Disease History: 1 Age Score: 2 Gender Score: 0       ASSESSMENT AND PLAN: Persistent Atrial Fibrillation (ICD10:  I48.19) The patient's CHA2DS2-VASc score is 4, indicating a 4.8% annual risk of stroke.   - He reports maintain sinus rhythm following his ablation with episodes of palpitations that self spontaneously - Discussed the importance of compliance with  Eliquis  during the period. - Continue Eliquis  5 mg twice daily - Continue metoprolol  25 mg twice daily  Secondary Hypercoagulable State (ICD10:  D68.69) The patient is at significant risk for stroke/thromboembolism based upon his CHA2DS2-VASc Score of 4.  Continue Apixaban  (Eliquis ).    Bonney Terra Pac, PA-C    07/07/2024 1:48 PM     Follow up with A-fib clinic as needed.   Manus Wyn, NP-C [facility] [address] [place], [place] [address] [phone]

## 2024-07-20 ENCOUNTER — Ambulatory Visit: Admitting: Podiatry

## 2024-07-20 ENCOUNTER — Encounter: Payer: Self-pay | Admitting: Podiatry

## 2024-07-20 DIAGNOSIS — L97502 Non-pressure chronic ulcer of other part of unspecified foot with fat layer exposed: Secondary | ICD-10-CM | POA: Diagnosis not present

## 2024-07-20 NOTE — Progress Notes (Signed)
°  HPI: Patient presents for follow-up ulcer hallux left.  Using the Bactroban after soaking it twice a day.  Bleeding gauze dressing on it.  Denies N/V/F/Ch.     Objective:    Vascular:  DP pulses 2/4 bilaterally. PT pulses 2/4 bilaterally.  Capillary refill time immediate bilaterally.     Neurologic: Decreased protective sensation feet bilaterally   Dermatologic: Full-thickness ulceration plantar medial hallux left penetrating the subcutaneous tissue.. Wound base of mixed granulation and devitalized tissues..  Mild serous exudate.  Predebridement ulcer measures 1 mm x 1 mm x 2 deep.   Musculoskeletal:         Assessment:  Full-thickness Wagner grade 2 ulceration hallux left.   Plan:  Patient was evaluated and treated and all questions answered.   Ulcer plantar medial hallux left -Debridement as below. -Dressed with antibiotic ointment and DSD. -Continue off-loading with surgical shoe. -Wound care: Soak once daily luke warm epsom salt water  15 min . Apply Santyl ointment and dressing.   Procedure: Excisional Debridement of Wound Tool: Sharp #312 chisel blade/tissue nipper Type of Debridement: Sharp Excisional Frequency: Every 1 to 2 weeks until appropriately healed.   Rationale: Removal of non-viable soft tissue from the wound to promote healing.  Anesthesia: none   Pre-Debridement Wound Measurements: 1 mm x 1 mm x 2 mm deep Post-Debridement Wound Measurements: 3 mm x 3 mm x 3 mm deep Area devitalized tissue removed(nonviable tissue only): 5mm x 3 mm.  Blood loss: Minimal (<50cc) Depth of Debridement: Full-thickness into subcutaneous tissue  Description of tissue removed: Devitalized tissue Technique: The wound and the surrounding skin were prepped and draped in usual aseptic fashion.  Aseptic technique was maintained throughout the procedure.  Using #312 blade/tissue nipper sharp debridement of necrotic/nonviable tissue was performed until healthy bleeding wound bed was  achieved.  No underlying bone or tendon was exposed during debridement.  The wound was thoroughly irrigated with normal saline solution Wound Progress:  Debridement removed 3 mm x3 mm of the necrotic tissue and subcutaneous tissue.  Purulent drainage not present. Nonviable tissue present at the base of the wound.  Sharp debridement was performed to remove the necrotic tissue/nonviable tissue back to viable tissue.  No devitalized/nonviable tissue present postdebridement.  Wound appeared clean and clear of infection No material in the wound was present that was identified to be inhibiting healing. Dressing: Dry, sterile, compression dressing. Disposition: Patient tolerated procedure well.    Return 2 weeks f/u ulcer

## 2024-08-10 ENCOUNTER — Encounter: Payer: Self-pay | Admitting: Podiatry

## 2024-08-10 ENCOUNTER — Ambulatory Visit: Admitting: Podiatry

## 2024-08-10 DIAGNOSIS — L97501 Non-pressure chronic ulcer of other part of unspecified foot limited to breakdown of skin: Secondary | ICD-10-CM | POA: Diagnosis not present

## 2024-08-10 NOTE — Progress Notes (Signed)
 Patient presents follow-up of ulceration hallux left.  Doing well no fever chills nausea or vomiting.  Does have a little bit of plantar exudate on the bandage at this point.  Physical Exam:  Patient alert and oriented x 3.  No complaints of nausea, vomiting, fever, or chills  Vascular: DP pulses 2/4 bilateral. PT pulses 2/4 lateral.  Mild edema lower extremity. Capillary fill time immediate bilaterally.  Dermatologic: Superficial ulcer just penetrating of the dermis plantar medial aspect hallux left..  Slight clear exudate.  No signs of infection.  Measures 3 mm wide x 3 mm long x 2 mm deep.   Neurologic:   Musculoskeletal:    Diagnoses: 1.  Superficial ulceration Wagner grade 1 plantar medial aspect hallux left  Plan: - Ulcer healing well is down to a superficial ulcer instead of full-thickness.  Continue current wound care he can just clean the area with soapy water  for about a minute once a day at this point continue to alternate every 12 hours Santyl and Bactroban -Sharp debridement with tissue nippers superficial Wagner grade 1 ulceration hallux left.  Debrided any devitalized tissue with a tissue nipper.  Applied antibiotic ointment and a light dressing    Return 2 weeks f/u ulcer 1 left

## 2024-08-25 ENCOUNTER — Ambulatory Visit: Admitting: Podiatry

## 2024-08-25 DIAGNOSIS — L97501 Non-pressure chronic ulcer of other part of unspecified foot limited to breakdown of skin: Secondary | ICD-10-CM

## 2024-08-25 NOTE — Progress Notes (Signed)
 For follow-up ulceration hallux left.  Says he has not noticed any drainage or redness for the past several days.  He is still doing wound care and is wearing surgical shoe.  Physical Exam:  Patient alert and oriented x 3.  No complaints of nausea, vomiting, fever, or chills  Vascular: DP pulses 2/4 bilateral. PT pulses 2 to/4 lateral.  Mild edema lower extremity. Capillary fill time immediate bilaterally.  Dermatologic: Ulcer healed over with no signs of infection.  Some hyperkeratotic tissue and tissue at the area but no breakdown of skin.  Neurologic:   Musculoskeletal: Hallux limitus left.   Diagnoses: 1.  Superficial ulceration Wagner grade 1 hallux left foot.  Plan: - Established office visit for evaluation and management level 2. - He can discontinue  wound care and return to regular shoe.  Recommended good stable athletic shoe to take pressure off this area.  Wear good well cushioned socks not on cotton.  Watch for any signs of breakdown.  He has any signs of breakdown or infection that he should call us  immediately.   Return as needed

## 2024-09-15 ENCOUNTER — Ambulatory Visit: Admitting: Student in an Organized Health Care Education/Training Program
